# Patient Record
Sex: Female | Born: 1945 | Race: White | Hispanic: No | Marital: Married | Smoking: Former smoker
Health system: Southern US, Community
[De-identification: ages and names within clinical notes are randomized; demographics above are authoritative.]

## PROBLEM LIST (undated history)

## (undated) DIAGNOSIS — E785 Hyperlipidemia, unspecified: Secondary | ICD-10-CM

## (undated) DIAGNOSIS — M1990 Unspecified osteoarthritis, unspecified site: Secondary | ICD-10-CM

## (undated) DIAGNOSIS — C439 Malignant melanoma of skin, unspecified: Secondary | ICD-10-CM

## (undated) DIAGNOSIS — I10 Essential (primary) hypertension: Secondary | ICD-10-CM

## (undated) DIAGNOSIS — Z86010 Personal history of colonic polyps: Secondary | ICD-10-CM

## (undated) DIAGNOSIS — B79 Trichuriasis: Secondary | ICD-10-CM

## (undated) DIAGNOSIS — R42 Dizziness and giddiness: Secondary | ICD-10-CM

## (undated) DIAGNOSIS — T7840XA Allergy, unspecified, initial encounter: Secondary | ICD-10-CM

## (undated) DIAGNOSIS — F419 Anxiety disorder, unspecified: Secondary | ICD-10-CM

## (undated) DIAGNOSIS — H409 Unspecified glaucoma: Secondary | ICD-10-CM

## (undated) DIAGNOSIS — N281 Cyst of kidney, acquired: Secondary | ICD-10-CM

## (undated) HISTORY — PX: TONSILLECTOMY: SUR1361

## (undated) HISTORY — PX: ARM WOUND REPAIR / CLOSURE: SUR1141

## (undated) HISTORY — DX: Dizziness and giddiness: R42

## (undated) HISTORY — PX: WISDOM TOOTH EXTRACTION: SHX21

## (undated) HISTORY — PX: CATARACT EXTRACTION W/ INTRAOCULAR LENS  IMPLANT, BILATERAL: SHX1307

## (undated) HISTORY — PX: OTHER SURGICAL HISTORY: SHX169

## (undated) HISTORY — PX: COLONOSCOPY: SHX174

## (undated) HISTORY — PX: CHOLECYSTECTOMY: SHX55

## (undated) HISTORY — DX: Essential (primary) hypertension: I10

## (undated) HISTORY — DX: Personal history of colonic polyps: Z86.010

## (undated) HISTORY — DX: Malignant melanoma of skin, unspecified: C43.9

## (undated) HISTORY — DX: Hyperlipidemia, unspecified: E78.5

## (undated) HISTORY — DX: Trichuriasis: B79

## (undated) HISTORY — DX: Unspecified osteoarthritis, unspecified site: M19.90

## (undated) HISTORY — PX: NM RENAL LASIX (ARMC HX): HXRAD1213

## (undated) HISTORY — DX: Allergy, unspecified, initial encounter: T78.40XA

## (undated) HISTORY — DX: Unspecified glaucoma: H40.9

## (undated) HISTORY — PX: MELANOMA EXCISION: SHX5266

---

## 1999-12-24 ENCOUNTER — Encounter: Payer: Self-pay | Admitting: *Deleted

## 1999-12-24 ENCOUNTER — Ambulatory Visit (HOSPITAL_COMMUNITY): Admission: RE | Admit: 1999-12-24 | Discharge: 1999-12-24 | Payer: Self-pay | Admitting: *Deleted

## 2000-01-01 ENCOUNTER — Other Ambulatory Visit: Admission: RE | Admit: 2000-01-01 | Discharge: 2000-01-01 | Payer: Self-pay | Admitting: Obstetrics and Gynecology

## 2001-04-14 ENCOUNTER — Other Ambulatory Visit: Admission: RE | Admit: 2001-04-14 | Discharge: 2001-04-14 | Payer: Self-pay | Admitting: Obstetrics and Gynecology

## 2006-12-12 ENCOUNTER — Ambulatory Visit: Payer: Self-pay | Admitting: Gastroenterology

## 2007-01-10 ENCOUNTER — Encounter (INDEPENDENT_AMBULATORY_CARE_PROVIDER_SITE_OTHER): Payer: Self-pay | Admitting: Specialist

## 2007-01-10 ENCOUNTER — Ambulatory Visit: Payer: Self-pay | Admitting: Gastroenterology

## 2007-01-10 DIAGNOSIS — K5730 Diverticulosis of large intestine without perforation or abscess without bleeding: Secondary | ICD-10-CM | POA: Insufficient documentation

## 2007-12-07 DIAGNOSIS — B79 Trichuriasis: Secondary | ICD-10-CM

## 2007-12-07 HISTORY — DX: Trichuriasis: B79

## 2008-01-07 HISTORY — PX: COLON SURGERY: SHX602

## 2008-01-22 ENCOUNTER — Ambulatory Visit: Payer: Self-pay | Admitting: Internal Medicine

## 2008-02-01 ENCOUNTER — Ambulatory Visit: Payer: Self-pay | Admitting: Internal Medicine

## 2008-02-01 ENCOUNTER — Encounter: Payer: Self-pay | Admitting: Internal Medicine

## 2008-02-01 DIAGNOSIS — K644 Residual hemorrhoidal skin tags: Secondary | ICD-10-CM | POA: Insufficient documentation

## 2008-02-01 DIAGNOSIS — Z860100 Personal history of colon polyps, unspecified: Secondary | ICD-10-CM

## 2008-02-01 DIAGNOSIS — K648 Other hemorrhoids: Secondary | ICD-10-CM | POA: Insufficient documentation

## 2008-02-01 HISTORY — DX: Personal history of colon polyps, unspecified: Z86.0100

## 2008-02-01 HISTORY — DX: Personal history of colonic polyps: Z86.010

## 2008-03-01 ENCOUNTER — Ambulatory Visit: Payer: Self-pay | Admitting: Internal Medicine

## 2008-03-01 DIAGNOSIS — E785 Hyperlipidemia, unspecified: Secondary | ICD-10-CM | POA: Insufficient documentation

## 2008-04-22 ENCOUNTER — Encounter: Payer: Self-pay | Admitting: Internal Medicine

## 2008-06-26 ENCOUNTER — Encounter (INDEPENDENT_AMBULATORY_CARE_PROVIDER_SITE_OTHER): Payer: Self-pay | Admitting: *Deleted

## 2008-06-26 ENCOUNTER — Inpatient Hospital Stay (HOSPITAL_COMMUNITY): Admission: RE | Admit: 2008-06-26 | Discharge: 2008-06-29 | Payer: Self-pay | Admitting: *Deleted

## 2008-07-04 ENCOUNTER — Encounter: Payer: Self-pay | Admitting: Internal Medicine

## 2008-07-05 ENCOUNTER — Encounter: Payer: Self-pay | Admitting: Internal Medicine

## 2010-12-26 ENCOUNTER — Encounter: Payer: Self-pay | Admitting: Emergency Medicine

## 2011-04-20 NOTE — Op Note (Signed)
NAME:  Meredith Cole, Meredith Cole             ACCOUNT NO.:  [id number]   MEDICAL RECORD NO.:  [id number]          PATIENT TYPE:  INP   LOCATION:  0006                         FACILITY:  [facility]   PHYSICIAN:  Alfonse Ras, MD   DATE OF BIRTH:  [birth year]   DATE OF PROCEDURE:  DATE OF DISCHARGE:                               OPERATIVE REPORT   PREOPERATIVE DIAGNOSIS:  Villous adenoma of the cecum.   POSTOPERATIVE DIAGNOSIS:  Villous adenoma of the cecum.   PROCEDURES:  Laparoscopic assisted ileocecostomy   SURGEON:  Baruch Merl   ASSISTANT:  Leonie Man   ANESTHESIA:  General.   DESCRIPTION:  The patient was taken to the operating placed in supine  position, after adequate general anesthesia was induced.  Using  endotracheal tube, the abdomen was prepped and draped in normal sterile  fashion.  Using a 11 mm trocar in the left upper quadrant under direct  vision with the Optiview pneumoperitoneal access was obtained.  Pneumoperitoneum was obtained.  A 5 mm trocar was placed in the right  midabdomen.  An additional, 10 mm trocar was placed in the left lower  quadrant.  The cecum and its area of tattooing were easily identified.  The appendix and ileocecal region were mobilized and the entire right  colon was mobilized using a harmonic scalpel along the line of Toldt.  There were a number of adhesions to the liver and these were taken down  with a harmonic scalpel.  We had very adequate mobilization and I was  satisfied that we were ready to convert to open for resection.  A  transverse incision was made in the right abdomen extending from the 5  mm trocar laterally dividing the oblique muscles and peritoneum was  entered.  The ileocecum was easily delivered up and through the wound.  The terminal ileum was transected approximately 5-7 cm proximal to the  ileocecal valve and the right colon was transected using a GI stapling  device just distal to the tattooing.  The mesentery was  taken down with  a LigaSure and the specimen was removed.  Opening it on the back table  verified the presence of sessile villous adenoma at least 5 cm from the  distal margin.  The side-to-side ileocolostomy was performed in standard  fashion using a GI 75 and a 60 mm TA.  The mesentery was closed with  interrupted 2-0 silks.  The anastomosis was widely patent.  This was  accomplished  over a wound protector.  There was no spillage the abdomen was copiously  irrigated.  All trocars had been removed.  The fascia was closed in two  layers using running #1 Novofil.  Skin was closed with staples.  Sterile  dressings were applied.  The patient tolerated the procedure, well went  to PACU in good condition.      Alfonse Ras, MD  Electronically Signed     KRE/MEDQ  D:  06/26/2008  T:  06/27/2008  Job:  [id number]   cc:   Iva Boop, MD,FACG  [facility]  [address] [place], [place]  [address]   Duncan Dull, M.D.  Fax: [phone]

## 2011-04-20 NOTE — Assessment & Plan Note (Signed)
[facility]                         GASTROENTEROLOGY OFFICE NOTE   NAME:Meredith Cole, Meredith Cole                    MRN:          [id number]  DATE:03/01/2008                            DOB:          [birth year]    CHIEF COMPLAINT:  Follow up of colon polyp.   [name] had a very large tubulovillous adenoma, partially removed  from the colon on February 01, 2008.  I tattooed and clipped the area to  mark the site.  X-ray demonstrated the clip in the right colon, probably  near the ileocecal valve.  I believe this polyp corresponded to one  originally diagnosed at colonoscopy on 01/10/07 by Dr. Corinda Gubler.  She  also incidentally had some whipworm present, and we treated that with  Albendazole.  It turns out, she was in [place] on a mission trip  within the past year, so perhaps that is where she contracted that.   Today I explained to her that I thought this polyp was too large in the  way it interdigitated in a flat fashion into the mucosa, that it would  be extremely unlikely, if not impossible, to get a complete resection  with the colonoscope.  She has seen Dr. Colin Benton in the past for her  gallbladder and would like to see her with respect to surgery, and I  think that is just fine.   PAST MEDICAL HISTORY:  Also notable for hypertension, dyslipidemia, and  cholecystectomy.   FAMILY HISTORY:  Positive for mother with colon cancer.   PHYSICAL EXAMINATION:  Height 5 feet 8.  Weight 207 pounds.  Blood  pressure 130/82, pulse 88.   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  In my  judgment, this is not resectable with colonoscopic methods.   PLAN:  Surgical consultation with Dr. Baruch Merl.  Note that the  patient has an upcoming trip planned for July.  This is a benign lesion.  I do not think there is any urgency to resect it, but it ought to be  done sometime this year, I think.   NOTE:  Would plan for a recall colonoscopy in  three years for polyp  surveillance, February, 2012.     Iva Boop, MD,FACG  Electronically Signed    CEG/MedQ  DD: 03/01/2008  DT: 03/01/2008  Job #: [id number]   cc:   Alfonse Ras, MD  Duncan Dull, M.D.

## 2011-05-11 ENCOUNTER — Telehealth: Payer: Self-pay | Admitting: Internal Medicine

## 2011-05-11 NOTE — Telephone Encounter (Signed)
I have placed the colon, surgical report and path on your desk for review.  Please advise when she needs colon.

## 2011-05-12 ENCOUNTER — Encounter: Payer: Self-pay | Admitting: Internal Medicine

## 2011-05-12 NOTE — Telephone Encounter (Signed)
I have left a message for the patient to call back to schedule a colon at anytime she is due in July

## 2011-05-12 NOTE — Telephone Encounter (Signed)
July 2012 will be time for next routine repeat colonoscopy She can schedule it in June if she wants Reason is v76.51 and v12.72

## 2011-06-14 ENCOUNTER — Ambulatory Visit (AMBULATORY_SURGERY_CENTER): Payer: Medicare Other | Admitting: *Deleted

## 2011-06-14 DIAGNOSIS — Z86010 Personal history of colonic polyps: Secondary | ICD-10-CM

## 2011-06-14 DIAGNOSIS — Z800 Family history of malignant neoplasm of digestive organs: Secondary | ICD-10-CM

## 2011-06-14 MED ORDER — PEG-KCL-NACL-NASULF-NA ASC-C 100 G PO SOLR: ORAL | Status: DC

## 2011-06-28 ENCOUNTER — Encounter: Payer: Self-pay | Admitting: Internal Medicine

## 2011-06-28 ENCOUNTER — Ambulatory Visit (AMBULATORY_SURGERY_CENTER): Payer: Medicare Other | Admitting: Internal Medicine

## 2011-06-28 VITALS — BP 116/67 | HR 61 | Temp 98.5°F | Resp 16 | Ht 67.0 in | Wt 183.0 lb

## 2011-06-28 DIAGNOSIS — Z800 Family history of malignant neoplasm of digestive organs: Secondary | ICD-10-CM

## 2011-06-28 DIAGNOSIS — K5730 Diverticulosis of large intestine without perforation or abscess without bleeding: Secondary | ICD-10-CM

## 2011-06-28 DIAGNOSIS — Z860100 Personal history of colon polyps, unspecified: Secondary | ICD-10-CM

## 2011-06-28 DIAGNOSIS — Z1211 Encounter for screening for malignant neoplasm of colon: Secondary | ICD-10-CM

## 2011-06-28 DIAGNOSIS — K644 Residual hemorrhoidal skin tags: Secondary | ICD-10-CM

## 2011-06-28 MED ORDER — SODIUM CHLORIDE 0.9 % IV SOLN: 500.0000 mL | INTRAVENOUS | Status: DC

## 2011-06-28 NOTE — Patient Instructions (Addendum)
Your exam today showed no polyps or cancer. You do have some diverticulosis and small hemorrhoids which are usually not a problem. Please read the information below as well as the other discharge instructions provided. You need a routine repeat colonoscopy in 5 years. We will try to do a better job of tracking that.  Iva Boop, MD, [facility]  Diverticulosis Diverticulosis is a common condition that develops when small pouches (diverticula) form in the wall of the colon. The risk of diverticulosis increases with age. It happens more often in people who eat a low-fiber diet. Most individuals with diverticulosis have no symptoms. Those individuals with symptoms usually experience belly (abdominal) pain, constipation, or loose stools (diarrhea). HOME CARE INSTRUCTIONS  Increase the amount of fiber in your diet as directed by your caregiver or dietician. This may reduce symptoms of diverticulosis.   Your caregiver may recommend taking a dietary fiber supplement.   Drink at least 6 to 8 glasses of water each day to prevent constipation.   Try not to strain when you have a bowel movement.   Your caregiver may recommend avoiding nuts and seeds to prevent complications, although this is still an uncertain benefit.   Only take over-the-counter or prescription medicines for pain, discomfort, or fever as directed by your caregiver.  FOODS HAVING HIGH FIBER CONTENT INCLUDE:  Fruits. Apple, peach, pear, tangerine, raisins, prunes.   Vegetables. Brussels sprouts, asparagus, broccoli, cabbage, carrot, cauliflower, romaine lettuce, spinach, summer squash, tomato, winter squash, zucchini.   Starchy Vegetables. Baked beans, kidney beans, lima beans, split peas, lentils, potatoes (with skin).   Grains. Whole wheat bread, brown rice, bran flake cereal, plain oatmeal, white rice, shredded wheat, bran muffins.  SEEK IMMEDIATE MEDICAL CARE IF:  You develop increasing pain or severe bloating.   You have an  oral temperature above 100.57F, not controlled by medicine.   You develop vomiting or bowel movements that are bloody or black.  Document Released: 08/19/2004 Document Re-Released: 05/12/2010 [facility] Patient Information 2011 [place], [place].  Hemorrhoids Hemorrhoids are dilated (enlarged) veins around the rectum. Sometimes clots will form in the veins. This makes them swollen and painful. These are called thrombosed hemorrhoids. Causes of hemorrhoids include:  Pregnancy: this increases the pressure in the hemorrhoidal veins.   Constipation.   Straining to have a bowel movement.  HOME CARE INSTRUCTIONS  Eat a well balanced diet and drink 6 to 8 glasses of water every day to avoid constipation. You may also use a bulk laxative.   Avoid straining to have bowel movements.   Keep anal area dry and clean.   Only take over-the-counter or prescription medicines for pain, discomfort, or fever as directed by your caregiver.  If thrombosed:  Take hot sitz baths for 20 to 30 minutes, 3 to 4 times per day.   If the hemorrhoids are very tender and swollen, place ice packs on area as tolerated. Using ice packs between sitz baths may be helpful. Fill a plastic bag with ice and use a towel between the bag of ice and your skin.   Special creams and suppositories (Anusol, Nupercainal, Wyanoids) may be used or applied as directed.   Do not use a donut shaped pillow or sit on the toilet for long periods. This increases blood pooling and pain.   Move your bowels when your body has the urge; this will require less straining and will decrease pain and pressure.   Only take over-the-counter or prescription medicines for pain, discomfort, or fever as  directed by your caregiver.  SEEK MEDICAL CARE IF:  You have increasing pain and swelling that is not controlled with your prescription.   You have uncontrolled bleeding.   You have an inability or difficulty having a bowel movement.   You have pain  or inflammation outside the area of the hemorrhoids.   You have chills and/or an oral temperature above 100.5F that lasts for 2 days or longer, or as your caregiver suggests.  MAKE SURE YOU:   Understand these instructions.   Will watch your condition.   Will get help right away if you are not doing well or get worse.  Document Released: 11/19/2000 Document Re-Released: 11/04/2008 [facility] Patient Information 2011 [place], [place].   Please review all discharge papers given to you by the recovery room nurse.  If you experience problems after discharge you may call [phone]. You will receive a call in the am from one of our nurses to see how you are doing and answer any questions you may have. Thank You.

## 2011-06-28 NOTE — Progress Notes (Signed)
ANASTOMOSIS VISUALIZED BY MD WITH PROCEDURE. [facility] RN

## 2011-06-29 ENCOUNTER — Telehealth: Payer: Self-pay | Admitting: *Deleted

## 2011-06-29 NOTE — Telephone Encounter (Signed)

## 2011-09-03 LAB — CBC
HCT: 32 — ABNORMAL LOW
HCT: 33.5 — ABNORMAL LOW
HCT: 38.8
Hemoglobin: 13.4
MCV: 94.6
MCV: 95.6
Platelets: 159
Platelets: 186
Platelets: 201
RBC: 4.1
RDW: 12.9
RDW: 13.3
WBC: 16 — ABNORMAL HIGH
WBC: 7.2

## 2011-09-03 LAB — BASIC METABOLIC PANEL
BUN: 8
Chloride: 103
Chloride: 104
GFR calc Af Amer: >60
GFR calc non Af Amer: >60
Glucose, Bld: 158 — ABNORMAL HIGH
Potassium: 4
Potassium: 4.5
Sodium: 141

## 2011-09-03 LAB — DIFFERENTIAL
Eosinophils Relative: 3
Lymphocytes Relative: 38
Lymphs Abs: 2.7
Monocytes Relative: 6

## 2012-01-10 ENCOUNTER — Ambulatory Visit: Payer: Medicare Other

## 2012-01-13 ENCOUNTER — Ambulatory Visit: Payer: Medicare Other | Attending: Family Medicine | Admitting: Physical Therapy

## 2012-01-13 DIAGNOSIS — M545 Low back pain, unspecified: Secondary | ICD-10-CM | POA: Insufficient documentation

## 2012-01-13 DIAGNOSIS — IMO0001 Reserved for inherently not codable concepts without codable children: Secondary | ICD-10-CM | POA: Insufficient documentation

## 2012-01-21 ENCOUNTER — Ambulatory Visit: Payer: Medicare Other

## 2012-01-28 ENCOUNTER — Ambulatory Visit: Payer: Medicare Other

## 2012-02-04 ENCOUNTER — Ambulatory Visit: Payer: Medicare Other | Attending: Family Medicine

## 2012-02-04 DIAGNOSIS — IMO0001 Reserved for inherently not codable concepts without codable children: Secondary | ICD-10-CM | POA: Insufficient documentation

## 2012-02-04 DIAGNOSIS — M545 Low back pain, unspecified: Secondary | ICD-10-CM | POA: Insufficient documentation

## 2012-06-29 ENCOUNTER — Other Ambulatory Visit: Payer: Self-pay | Admitting: Family Medicine

## 2012-06-29 ENCOUNTER — Ambulatory Visit
Admission: RE | Admit: 2012-06-29 | Discharge: 2012-06-29 | Disposition: A | Discharge status: Home / Self Care | Payer: Medicare Other | Source: Ambulatory Visit | Attending: Family Medicine | Admitting: Family Medicine

## 2012-06-29 DIAGNOSIS — M5410 Radiculopathy, site unspecified: Secondary | ICD-10-CM

## 2012-09-29 ENCOUNTER — Other Ambulatory Visit: Payer: Self-pay | Admitting: Dermatology

## 2012-11-15 ENCOUNTER — Other Ambulatory Visit: Payer: Self-pay | Admitting: Dermatology

## 2013-04-10 ENCOUNTER — Other Ambulatory Visit: Payer: Self-pay | Admitting: Dermatology

## 2013-12-12 ENCOUNTER — Other Ambulatory Visit: Payer: Self-pay | Admitting: Dermatology

## 2013-12-25 ENCOUNTER — Other Ambulatory Visit: Payer: Self-pay | Admitting: Dermatology

## 2014-04-25 ENCOUNTER — Other Ambulatory Visit: Payer: Self-pay | Admitting: Dermatology

## 2014-07-05 ENCOUNTER — Other Ambulatory Visit: Payer: Self-pay | Admitting: Family Medicine

## 2014-07-05 DIAGNOSIS — R1310 Dysphagia, unspecified: Secondary | ICD-10-CM

## 2014-07-15 ENCOUNTER — Ambulatory Visit
Admission: RE | Admit: 2014-07-15 | Discharge: 2014-07-15 | Disposition: A | Discharge status: Home / Self Care | Payer: PRIVATE HEALTH INSURANCE | Source: Ambulatory Visit | Attending: Family Medicine | Admitting: Family Medicine

## 2014-07-15 DIAGNOSIS — R1310 Dysphagia, unspecified: Secondary | ICD-10-CM

## 2014-12-06 DIAGNOSIS — C439 Malignant melanoma of skin, unspecified: Secondary | ICD-10-CM

## 2014-12-06 HISTORY — DX: Malignant melanoma of skin, unspecified: C43.9

## 2015-05-09 DIAGNOSIS — Z85828 Personal history of other malignant neoplasm of skin: Secondary | ICD-10-CM | POA: Diagnosis not present

## 2015-05-09 DIAGNOSIS — D2261 Melanocytic nevi of right upper limb, including shoulder: Secondary | ICD-10-CM | POA: Diagnosis not present

## 2015-05-09 DIAGNOSIS — D2272 Melanocytic nevi of left lower limb, including hip: Secondary | ICD-10-CM | POA: Diagnosis not present

## 2015-05-09 DIAGNOSIS — D2262 Melanocytic nevi of left upper limb, including shoulder: Secondary | ICD-10-CM | POA: Diagnosis not present

## 2015-05-09 DIAGNOSIS — L821 Other seborrheic keratosis: Secondary | ICD-10-CM | POA: Diagnosis not present

## 2015-05-09 DIAGNOSIS — D2271 Melanocytic nevi of right lower limb, including hip: Secondary | ICD-10-CM | POA: Diagnosis not present

## 2015-05-09 DIAGNOSIS — Z85820 Personal history of malignant melanoma of skin: Secondary | ICD-10-CM | POA: Diagnosis not present

## 2015-05-09 DIAGNOSIS — D225 Melanocytic nevi of trunk: Secondary | ICD-10-CM | POA: Diagnosis not present

## 2015-05-09 DIAGNOSIS — D1801 Hemangioma of skin and subcutaneous tissue: Secondary | ICD-10-CM | POA: Diagnosis not present

## 2015-06-06 ENCOUNTER — Encounter: Payer: Self-pay | Admitting: Internal Medicine

## 2015-06-06 ENCOUNTER — Encounter: Payer: Self-pay | Admitting: Gastroenterology

## 2015-07-07 DIAGNOSIS — R5383 Other fatigue: Secondary | ICD-10-CM | POA: Diagnosis not present

## 2015-07-07 DIAGNOSIS — J301 Allergic rhinitis due to pollen: Secondary | ICD-10-CM | POA: Diagnosis not present

## 2015-07-07 DIAGNOSIS — F419 Anxiety disorder, unspecified: Secondary | ICD-10-CM | POA: Diagnosis not present

## 2015-07-07 DIAGNOSIS — N952 Postmenopausal atrophic vaginitis: Secondary | ICD-10-CM | POA: Diagnosis not present

## 2015-07-07 DIAGNOSIS — Z0000 Encounter for general adult medical examination without abnormal findings: Secondary | ICD-10-CM | POA: Diagnosis not present

## 2015-07-07 DIAGNOSIS — F322 Major depressive disorder, single episode, severe without psychotic features: Secondary | ICD-10-CM | POA: Diagnosis not present

## 2015-07-07 DIAGNOSIS — E780 Pure hypercholesterolemia: Secondary | ICD-10-CM | POA: Diagnosis not present

## 2015-08-18 DIAGNOSIS — M810 Age-related osteoporosis without current pathological fracture: Secondary | ICD-10-CM | POA: Diagnosis not present

## 2015-08-18 DIAGNOSIS — M8580 Other specified disorders of bone density and structure, unspecified site: Secondary | ICD-10-CM | POA: Diagnosis not present

## 2015-08-18 DIAGNOSIS — Z1231 Encounter for screening mammogram for malignant neoplasm of breast: Secondary | ICD-10-CM | POA: Diagnosis not present

## 2015-11-24 DIAGNOSIS — Z85820 Personal history of malignant melanoma of skin: Secondary | ICD-10-CM | POA: Diagnosis not present

## 2015-11-24 DIAGNOSIS — Z85828 Personal history of other malignant neoplasm of skin: Secondary | ICD-10-CM | POA: Diagnosis not present

## 2015-11-24 DIAGNOSIS — D2271 Melanocytic nevi of right lower limb, including hip: Secondary | ICD-10-CM | POA: Diagnosis not present

## 2015-11-24 DIAGNOSIS — D171 Benign lipomatous neoplasm of skin and subcutaneous tissue of trunk: Secondary | ICD-10-CM | POA: Diagnosis not present

## 2015-11-24 DIAGNOSIS — D2262 Melanocytic nevi of left upper limb, including shoulder: Secondary | ICD-10-CM | POA: Diagnosis not present

## 2015-11-24 DIAGNOSIS — D225 Melanocytic nevi of trunk: Secondary | ICD-10-CM | POA: Diagnosis not present

## 2015-11-24 DIAGNOSIS — D2261 Melanocytic nevi of right upper limb, including shoulder: Secondary | ICD-10-CM | POA: Diagnosis not present

## 2015-11-24 DIAGNOSIS — D2272 Melanocytic nevi of left lower limb, including hip: Secondary | ICD-10-CM | POA: Diagnosis not present

## 2015-11-24 DIAGNOSIS — J029 Acute pharyngitis, unspecified: Secondary | ICD-10-CM | POA: Diagnosis not present

## 2015-11-24 DIAGNOSIS — L814 Other melanin hyperpigmentation: Secondary | ICD-10-CM | POA: Diagnosis not present

## 2015-12-07 HISTORY — PX: COLONOSCOPY: SHX174

## 2015-12-07 HISTORY — PX: POLYPECTOMY: SHX149

## 2016-01-14 DIAGNOSIS — R002 Palpitations: Secondary | ICD-10-CM | POA: Insufficient documentation

## 2016-01-19 ENCOUNTER — Ambulatory Visit (INDEPENDENT_AMBULATORY_CARE_PROVIDER_SITE_OTHER): Payer: PRIVATE HEALTH INSURANCE

## 2016-01-19 DIAGNOSIS — R002 Palpitations: Secondary | ICD-10-CM

## 2016-07-06 ENCOUNTER — Encounter: Payer: Self-pay | Admitting: Internal Medicine

## 2016-07-09 ENCOUNTER — Encounter: Payer: Self-pay | Admitting: Internal Medicine

## 2016-09-10 ENCOUNTER — Other Ambulatory Visit: Payer: Self-pay | Admitting: Family Medicine

## 2016-09-10 DIAGNOSIS — G4459 Other complicated headache syndrome: Secondary | ICD-10-CM

## 2016-09-11 ENCOUNTER — Ambulatory Visit
Admission: RE | Admit: 2016-09-11 | Discharge: 2016-09-11 | Disposition: A | Discharge status: Home / Self Care | Payer: PRIVATE HEALTH INSURANCE | Source: Ambulatory Visit | Attending: Family Medicine | Admitting: Family Medicine

## 2016-09-11 DIAGNOSIS — G4459 Other complicated headache syndrome: Secondary | ICD-10-CM

## 2016-09-11 MED ORDER — GADOBENATE DIMEGLUMINE 529 MG/ML IV SOLN
17.0000 mL | Freq: Once | INTRAVENOUS | Status: AC | PRN
  Administered 2016-09-11: 17 mL via INTRAVENOUS

## 2016-09-20 ENCOUNTER — Encounter: Payer: Self-pay | Admitting: Internal Medicine

## 2016-09-27 ENCOUNTER — Encounter: Payer: PRIVATE HEALTH INSURANCE | Admitting: Internal Medicine

## 2016-10-04 ENCOUNTER — Encounter: Payer: Self-pay | Admitting: Internal Medicine

## 2016-10-04 ENCOUNTER — Ambulatory Visit (AMBULATORY_SURGERY_CENTER): Payer: Self-pay | Admitting: *Deleted

## 2016-10-04 VITALS — Ht 66.5 in | Wt 197.0 lb

## 2016-10-04 DIAGNOSIS — Z86010 Personal history of colonic polyps: Secondary | ICD-10-CM

## 2016-10-04 DIAGNOSIS — Z800 Family history of malignant neoplasm of digestive organs: Secondary | ICD-10-CM

## 2016-10-04 NOTE — Progress Notes (Signed)
Patient denies any allergies to eggs or soy. Patient denies any problems with anesthesia/sedation. Patient denies any oxygen use at home and does not take any diet/weight loss medications. Pt declined EMMI education video.

## 2016-10-11 ENCOUNTER — Ambulatory Visit (AMBULATORY_SURGERY_CENTER): Payer: PRIVATE HEALTH INSURANCE | Admitting: Internal Medicine

## 2016-10-11 ENCOUNTER — Encounter: Payer: Self-pay | Admitting: Internal Medicine

## 2016-10-11 VITALS — BP 138/75 | HR 74 | Temp 98.0°F | Resp 13 | Ht 66.5 in | Wt 197.0 lb

## 2016-10-11 DIAGNOSIS — Z86010 Personal history of colonic polyps: Secondary | ICD-10-CM

## 2016-10-11 DIAGNOSIS — Z800 Family history of malignant neoplasm of digestive organs: Secondary | ICD-10-CM | POA: Diagnosis not present

## 2016-10-11 MED ORDER — SODIUM CHLORIDE 0.9 % IV SOLN
500.0000 mL | INTRAVENOUS | Status: DC
Start: 2016-10-11 — End: 2018-01-06

## 2016-10-11 NOTE — Progress Notes (Signed)
A/ox3, pleased with MAC, report to RN 

## 2016-10-11 NOTE — Op Note (Signed)
[facility] Patient Name: Tanajia Bramlet Procedure Date: 10/11/2016 3:40 PM MRN: [id number] Endoscopist: Gatha Mayer , MD Age: 70 Referring MD:  Date of Birth: 05/17/1946 Gender: Female Account #: [id number] Procedure:                Colonoscopy Indications:              Family history of colon cancer in a first-degree                            relative, Personal history of colonic polyps Medicines:                Propofol per Anesthesia, Monitored Anesthesia Care Procedure:                Pre-Anesthesia Assessment:                           - Prior to the procedure, a History and Physical                            was performed, and patient medications and                            allergies were reviewed. The patient's tolerance of                            previous anesthesia was also reviewed. The risks                            and benefits of the procedure and the sedation                            options and risks were discussed with the patient.                            All questions were answered, and informed consent                            was obtained. Prior Anticoagulants: The patient has                            taken no previous anticoagulant or antiplatelet                            agents. ASA Grade Assessment: II - A patient with                            mild systemic disease. After reviewing the risks                            and benefits, the patient was deemed in                            satisfactory condition to undergo the procedure.  After obtaining informed consent, the colonoscope                            was passed under direct vision. Throughout the                            procedure, the patient's blood pressure, pulse, and                            oxygen saturations were monitored continuously. The                            [id number] was introduced through the anus               and advanced to the the ileocolonic anastomosis.                            The rectum and Ileocolonic anastomsis areas were                            photographed. The quality of the bowel preparation                            was excellent. Scope In: 3:54:39 PM Scope Out: 4:09:21 PM Scope Withdrawal Time: 0 hours 8 minutes 12 seconds  Total Procedure Duration: 0 hours 14 minutes 42 seconds  Findings:                 The perianal and digital rectal examinations were                            normal.                           There was evidence of a prior end-to-end                            ileo-colonic anastomosis in the ascending colon.                            This was patent.                           A diffuse area of mildly melanotic mucosa was found                            in the entire colon.                           The exam was otherwise without abnormality on                            direct and retroflexion views. Complications:            No immediate complications. Estimated Blood Loss:     Estimated blood loss: none. Impression:               -  Patent end-to-end ileo-colonic anastomosis.                           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                           - The examination was otherwise normal on direct                            and retroflexion views.                           - No specimens collected. Recommendation:           - Patient has a contact number available for                            emergencies. The signs and symptoms of potential                            delayed complications were discussed with the                            patient. Return to normal activities tomorrow.                            Written discharge instructions were provided to the                            patient.                           - Resume previous diet.                           - Continue present medications.                            - Repeat colonoscopy in 5 years for screening                            purposes and for surveillance.                           - Personal hx cecal TV adenoma and mother died of                            colon cancer in 40's Gatha Mayer, MD 10/11/2016 4:17:08 PM This report has been signed electronically.

## 2016-10-11 NOTE — Patient Instructions (Addendum)
   No polyps.  Your next routine colonoscopy should be in 5 years - 2022.  I appreciate the opportunity to care for you. Gatha Mayer, MD, FACG     YOU HAD AN ENDOSCOPIC PROCEDURE TODAY AT [facility]:   Refer to the procedure report that was given to you for any specific questions about what was found during the examination.  If the procedure report does not answer your questions, please call your gastroenterologist to clarify.  If you requested that your care partner not be given the details of your procedure findings, then the procedure report has been included in a sealed envelope for you to review at your convenience later.  YOU SHOULD EXPECT: Some feelings of bloating in the abdomen. Passage of more gas than usual.  Walking can help get rid of the air that was put into your GI tract during the procedure and reduce the bloating. If you had a lower endoscopy (such as a colonoscopy or flexible sigmoidoscopy) you may notice spotting of blood in your stool or on the toilet paper. If you underwent a bowel prep for your procedure, you may not have a normal bowel movement for a few days.  Please Note:  You might notice some irritation and congestion in your nose or some drainage.  This is from the oxygen used during your procedure.  There is no need for concern and it should clear up in a day or so.  SYMPTOMS TO REPORT IMMEDIATELY:   Following lower endoscopy (colonoscopy or flexible sigmoidoscopy):  Excessive amounts of blood in the stool  Significant tenderness or worsening of abdominal pains  Swelling of the abdomen that is new, acute  Fever of 100F or higher    For urgent or emergent issues, a gastroenterologist can be reached at any hour by calling [phone].   DIET:  We do recommend a small meal at first, but then you may proceed to your regular diet.  Drink plenty of fluids but you should avoid alcoholic beverages for 24 hours.  ACTIVITY:  You  should plan to take it easy for the rest of today and you should NOT DRIVE or use heavy machinery until tomorrow (because of the sedation medicines used during the test).    FOLLOW UP: Our staff will call the number listed on your records the next business day following your procedure to check on you and address any questions or concerns that you may have regarding the information given to you following your procedure. If we do not reach you, we will leave a message.  However, if you are feeling well and you are not experiencing any problems, there is no need to return our call.  We will assume that you have returned to your regular daily activities without incident.  If any biopsies were taken you will be contacted by phone or by letter within the next 1-3 weeks.  Please call us at [phone] if you have not heard about the biopsies in 3 weeks.    SIGNATURES/CONFIDENTIALITY: You and/or your care partner have signed paperwork which will be entered into your electronic medical record.  These signatures attest to the fact that that the information above on your After Visit Summary has been reviewed and is understood.  Full responsibility of the confidentiality of this discharge information lies with you and/or your care-partner.   Resume medications.

## 2016-10-12 ENCOUNTER — Telehealth: Payer: Self-pay

## 2016-10-12 NOTE — Telephone Encounter (Signed)
  Follow up Call-  Call back number 10/11/2016  Post procedure Call Back phone  # [phone] cell  Permission to leave phone message Yes  Some recent data might be hidden     Patient questions:  Do you have a fever, pain , or abdominal swelling? No. Pain Score  0 *  Have you tolerated food without any problems? Yes.    Have you been able to return to your normal activities? Yes.    Do you have any questions about your discharge instructions: Diet   No. Medications  No. Follow up visit  No.  Do you have questions or concerns about your Care? No.  Actions: * If pain score is 4 or above: No action needed, pain <4.

## 2016-10-30 ENCOUNTER — Emergency Department (HOSPITAL_COMMUNITY): Payer: PRIVATE HEALTH INSURANCE

## 2016-10-30 ENCOUNTER — Emergency Department (HOSPITAL_COMMUNITY)
Admission: EM | Admit: 2016-10-30 | Discharge: 2016-10-31 | Disposition: A | Discharge status: Home / Self Care | Payer: PRIVATE HEALTH INSURANCE | Attending: Emergency Medicine | Admitting: Emergency Medicine

## 2016-10-30 ENCOUNTER — Encounter (HOSPITAL_COMMUNITY): Payer: Self-pay | Admitting: Family Medicine

## 2016-10-30 DIAGNOSIS — R112 Nausea with vomiting, unspecified: Secondary | ICD-10-CM

## 2016-10-30 DIAGNOSIS — Z87891 Personal history of nicotine dependence: Secondary | ICD-10-CM | POA: Diagnosis not present

## 2016-10-30 DIAGNOSIS — R197 Diarrhea, unspecified: Secondary | ICD-10-CM | POA: Insufficient documentation

## 2016-10-30 DIAGNOSIS — R1084 Generalized abdominal pain: Secondary | ICD-10-CM | POA: Insufficient documentation

## 2016-10-30 DIAGNOSIS — Z7982 Long term (current) use of aspirin: Secondary | ICD-10-CM | POA: Insufficient documentation

## 2016-10-30 DIAGNOSIS — I10 Essential (primary) hypertension: Secondary | ICD-10-CM | POA: Diagnosis not present

## 2016-10-30 LAB — COMPREHENSIVE METABOLIC PANEL
ALBUMIN: 4.2 g/dL (ref 3.5–5.0)
ALK PHOS: 49 U/L (ref 38–126)
ALT: 23 U/L (ref 14–54)
ANION GAP: 8 (ref 5–15)
AST: 20 U/L (ref 15–41)
BILIRUBIN TOTAL: 1 mg/dL (ref 0.3–1.2)
BUN: 16 mg/dL (ref 6–20)
CALCIUM: 9.2 mg/dL (ref 8.9–10.3)
CO2: 27 mmol/L (ref 22–32)
Chloride: 104 mmol/L (ref 101–111)
Creatinine, Ser: 1.07 mg/dL — ABNORMAL HIGH (ref 0.44–1.00)
GFR calc non Af Amer: 51 mL/min — ABNORMAL LOW (ref >60)
GFR, EST AFRICAN AMERICAN: 59 mL/min — AB (ref >60)
GLUCOSE: 105 mg/dL — AB (ref 65–99)
POTASSIUM: 3.6 mmol/L (ref 3.5–5.1)
SODIUM: 139 mmol/L (ref 135–145)
TOTAL PROTEIN: 7.4 g/dL (ref 6.5–8.1)

## 2016-10-30 LAB — URINE MICROSCOPIC-ADD ON: RBC / HPF: NONE SEEN RBC/hpf (ref 0–5)

## 2016-10-30 LAB — URINALYSIS, ROUTINE W REFLEX MICROSCOPIC
Glucose, UA: NEGATIVE mg/dL
Ketones, ur: NEGATIVE mg/dL
NITRITE: NEGATIVE
PH: 5.5 (ref 5.0–8.0)
Protein, ur: NEGATIVE mg/dL
SPECIFIC GRAVITY, URINE: 1.028 (ref 1.005–1.030)

## 2016-10-30 LAB — LIPASE, BLOOD: Lipase: 26 U/L (ref 11–51)

## 2016-10-30 LAB — CBC
HEMATOCRIT: 42.2 % (ref 36.0–46.0)
HEMOGLOBIN: 14.2 g/dL (ref 12.0–15.0)
MCH: 31.6 pg (ref 26.0–34.0)
MCHC: 33.6 g/dL (ref 30.0–36.0)
MCV: 94 fL (ref 78.0–100.0)
Platelets: 263 10*3/uL (ref 150–400)
RBC: 4.49 MIL/uL (ref 3.87–5.11)
RDW: 13.7 % (ref 11.5–15.5)
WBC: 7.7 10*3/uL (ref 4.0–10.5)

## 2016-10-30 MED ORDER — IOPAMIDOL (ISOVUE-300) INJECTION 61%
INTRAVENOUS | Status: AC
  Filled 2016-10-30: qty 100

## 2016-10-30 MED ORDER — SODIUM CHLORIDE 0.9 % IV BOLUS (SEPSIS)
1000.0000 mL | Freq: Once | INTRAVENOUS | Status: AC
  Administered 2016-10-30: 1000 mL via INTRAVENOUS

## 2016-10-30 MED ORDER — ONDANSETRON HCL 4 MG/2ML IJ SOLN
4.0000 mg | Freq: Once | INTRAMUSCULAR | Status: AC
  Administered 2016-10-30: 4 mg via INTRAVENOUS
  Filled 2016-10-30: qty 2

## 2016-10-30 MED ORDER — MORPHINE SULFATE (PF) 4 MG/ML IV SOLN
4.0000 mg | Freq: Once | INTRAVENOUS | Status: AC
  Administered 2016-10-30: 4 mg via INTRAVENOUS
  Filled 2016-10-30: qty 1

## 2016-10-30 MED ORDER — IOPAMIDOL (ISOVUE-300) INJECTION 61%
100.0000 mL | Freq: Once | INTRAVENOUS | Status: AC | PRN
  Administered 2016-10-30: 100 mL via INTRAVENOUS

## 2016-10-30 MED ORDER — ONDANSETRON HCL 4 MG PO TABS: 4.0000 mg | ORAL_TABLET | Freq: Four times a day (QID) | ORAL | 0 refills | Status: DC

## 2016-10-30 MED ORDER — SODIUM CHLORIDE 0.9 % IJ SOLN
INTRAMUSCULAR | Status: AC
  Filled 2016-10-30: qty 50

## 2016-10-30 MED ORDER — HYDROCODONE-ACETAMINOPHEN 5-325 MG PO TABS: 1.0000 | ORAL_TABLET | Freq: Four times a day (QID) | ORAL | 0 refills | Status: DC | PRN

## 2016-10-30 NOTE — ED Notes (Signed)
Currently awaiting CT scan of abdomen at this time.

## 2016-10-30 NOTE — ED Notes (Signed)
PATIENT STATES SHE HAS BLACK TAR LIKE BOWEL MOVEMENTS

## 2016-10-30 NOTE — ED Triage Notes (Signed)
Patient is complaining of mid abd pain with nausea, vomiting, and diarrhea. Pt states when she went to the restroom at 16:00, pt describes "black, tar, gooey, diarrhea". Symptoms started on Wednesday morning.

## 2016-10-30 NOTE — ED Provider Notes (Signed)
[facility] DEPT Provider Note   CSN: [id number] Arrival date & time: 10/30/16  1747     History   Chief Complaint Chief Complaint  Patient presents with  . Abdominal Pain  . Emesis  . Diarrhea    HPI Meredith Cole is a 70 y.o. female.  Patient presents to the emergency department with chief complaint of generalized abdominal pain, nausea, vomiting, diarrhea. She states that the symptoms started on Wednesday morning, and gradually worsened, but now have been improving over the course of the day today. She denies any associated fevers chills. She states that the pain comes and intervals, but has been improving. She reports having prior cholecystectomy, colectomy years ago. She has not taken anything for her symptoms. There are no modifying factors. There are no other associated symptoms.   The history is provided by the patient. No language interpreter was used.    Past Medical History:  Diagnosis Date  . Allergy   . Arthritis    back  . Hyperlipidemia   . Hypertension   . Melanoma ([place]) 2016  . 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 Trichuriasis - whipworm 2009   Incidental at colonoscopy - treated with mebendazole  . Vertigo     Patient Active Problem List   Diagnosis Date Noted  . Palpitations 01/14/2016  . HYPERLIPIDEMIA 03/01/2008  . 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 bone plate and graft left upper arm    . CESAREAN SECTION    . CHOLECYSTECTOMY    . COLON SURGERY  01/2008   Dr. Zettie Pho; right hemi-colectomy for tubulovillous adenoma  . COLONOSCOPY  2002, 2008, 2009 and 06/28/2011   2002: 49mm polyp, 2008-2009: large ascending tubulovillous adenoma. 2012: diverticulosis, external hemorrhoids  . MELANOMA EXCISION     face    OB History    No data available       Home Medications    Prior to Admission medications   Medication Sig Start Date  End Date Taking? Authorizing Provider  ALPRAZolam Duanne Moron) 0.25 MG tablet Take 0.5 mg by mouth at bedtime.     Historical Provider, MD  aspirin 81 MG tablet Take 81 mg by mouth daily.      Historical Provider, MD  atorvastatin (LIPITOR) 20 MG tablet Take 20 mg by mouth daily.      Historical Provider, MD  fexofenadine (ALLEGRA) 180 MG tablet Take 180 mg by mouth daily.      Historical Provider, MD  meclizine (ANTIVERT) 25 MG tablet Take 1 tablet by mouth as needed. 05/10/11   Historical Provider, MD  Multiple Vitamin (MULTIVITAMIN) tablet Take 1 tablet by mouth daily.      Historical Provider, MD    Family History Family History  Problem Relation Age of Onset  . Colon cancer Mother 57    died at age 38  . Heart disease Father   . Esophageal cancer Neg Hx   . Pancreatic cancer Neg Hx   . Rectal cancer Neg Hx   . Stomach cancer Neg Hx     Social History Social History  Substance Use Topics  . Smoking status: Former Smoker    Packs/day: 1.00    Years: 3.00    Types: Cigarettes    Quit date: 09/27/1972  . Smokeless tobacco: Never Used  . Alcohol use 1.2 oz/week    2 Glasses of wine per week     Allergies  Patient has no known allergies.   Review of Systems Review of Systems  Gastrointestinal: Positive for abdominal pain, diarrhea, nausea and vomiting.  All other systems reviewed and are negative.    Physical Exam Updated Vital Signs BP 157/71 (BP Location: Left Arm)   Pulse 80   Temp 98.4 F (36.9 C) (Oral)   Resp 18   Ht 5\' 7"  (1.702 m)   Wt 87.3 kg   SpO2 100%   BMI 30.16 kg/m   Physical Exam  Constitutional: She is oriented to person, place, and time. She appears well-developed and well-nourished.  HENT:  Head: Normocephalic and atraumatic.  Eyes: Conjunctivae and EOM are normal. Pupils are equal, round, and reactive to light.  Neck: Normal range of motion. Neck supple.  Cardiovascular: Normal rate and regular rhythm.  Exam reveals no gallop and no  friction rub.   No murmur heard. Pulmonary/Chest: Effort normal and breath sounds normal. No respiratory distress. She has no wheezes. She has no rales. She exhibits no tenderness.  Abdominal: Soft. Bowel sounds are normal. She exhibits no distension and no mass. There is tenderness. There is no rebound and no guarding.  Musculoskeletal: Normal range of motion. She exhibits no edema or tenderness.  Neurological: She is alert and oriented to person, place, and time.  Skin: Skin is warm and dry.  Psychiatric: She has a normal mood and affect. Her behavior is normal. Judgment and thought content normal.  Nursing note and vitals reviewed.    ED Treatments / Results  Labs (all labs ordered are listed, but only abnormal results are displayed) Labs Reviewed  COMPREHENSIVE METABOLIC PANEL - Abnormal; Notable for the following:       Result Value   Glucose, Bld 105 (*)    Creatinine, Ser 1.07 (*)    GFR calc non Af Amer 51 (*)    GFR calc Af Amer 59 (*)    All other components within normal limits  URINALYSIS, ROUTINE W REFLEX MICROSCOPIC (NOT AT [facility]) - Abnormal; Notable for the following:    Color, Urine AMBER (*)    APPearance CLOUDY (*)    Hgb urine dipstick TRACE (*)    Bilirubin Urine SMALL (*)    Leukocytes, UA SMALL (*)    All other components within normal limits  URINE MICROSCOPIC-ADD ON - Abnormal; Notable for the following:    Squamous Epithelial / LPF 6-30 (*)    Bacteria, UA RARE (*)    Crystals CA OXALATE CRYSTALS (*)    All other components within normal limits  LIPASE, BLOOD  CBC    EKG  EKG Interpretation None       Radiology No results found.  Procedures Procedures (including critical care time)  Medications Ordered in ED Medications  morphine 4 MG/ML injection 4 mg (not administered)  ondansetron (ZOFRAN) injection 4 mg (not administered)  sodium chloride 0.9 % bolus 1,000 mL (1,000 mLs Intravenous New Bag/Given 10/30/16 2118)     Initial  Impression / Assessment and Plan / ED Course  I have reviewed the triage vital signs and the nursing notes.  Pertinent labs & imaging results that were available during my care of the patient were reviewed by me and considered in my medical decision making (see chart for details).  Clinical Course     Patient with generalized abdominal tenderness, nausea, vomiting, diarrhea. Will check labs and reassess.  Laboratory workup was fairly reassuring, however given multiple prior abdominal surgeries, feel that a CT scan is indicated  to rule out obstruction.  CT scan is negative for obstruction or acute surgical process. It does show inflammatory versus infectious ileitis. Discussed with Dr. Ralene Bathe, who agrees with plan for primary care/gastroenterology follow-up. Discharged home. Patient understands agrees the plan. She is stable and ready for discharge. She states that she has been feeling progressively better throughout the day today.  Final Clinical Impressions(s) / ED Diagnoses   Final diagnoses:  Generalized abdominal pain  Nausea vomiting and diarrhea    New Prescriptions Discharge Medication List as of 10/30/2016 11:53 PM    START taking these medications   Details  HYDROcodone-acetaminophen (NORCO/VICODIN) 5-325 MG tablet Take 1-2 tablets by mouth every 6 (six) hours as needed., Starting Sat 10/30/2016, Print    ondansetron (ZOFRAN) 4 MG tablet Take 1 tablet (4 mg total) by mouth every 6 (six) hours., Starting Sat 10/30/2016, Print         Montine Circle, PA-C 10/31/16 0030    Quintella Reichert, MD 10/31/16 1536

## 2016-11-01 ENCOUNTER — Telehealth: Payer: Self-pay | Admitting: Internal Medicine

## 2016-11-01 NOTE — Telephone Encounter (Signed)
Patient in the ED with abdominal pain over the weekend.  Still having pain.  She will come in and see Alonza Bogus, PA tomorrow at 3:00.  Offered appt for today, but she states that she can't get here in time

## 2016-11-02 ENCOUNTER — Ambulatory Visit (INDEPENDENT_AMBULATORY_CARE_PROVIDER_SITE_OTHER): Payer: PRIVATE HEALTH INSURANCE | Admitting: Gastroenterology

## 2016-11-02 ENCOUNTER — Encounter: Payer: Self-pay | Admitting: Gastroenterology

## 2016-11-02 VITALS — BP 140/84 | HR 80 | Ht 66.5 in | Wt 194.4 lb

## 2016-11-02 DIAGNOSIS — K921 Melena: Secondary | ICD-10-CM | POA: Diagnosis not present

## 2016-11-02 DIAGNOSIS — R1013 Epigastric pain: Secondary | ICD-10-CM

## 2016-11-02 DIAGNOSIS — R1110 Vomiting, unspecified: Secondary | ICD-10-CM

## 2016-11-02 MED ORDER — PANTOPRAZOLE SODIUM 40 MG PO TBEC: 40.0000 mg | DELAYED_RELEASE_TABLET | Freq: Two times a day (BID) | ORAL | 2 refills | Status: DC

## 2016-11-02 NOTE — Patient Instructions (Signed)
We have sent the following medications to your pharmacy for you to pick up at your convenience:  Pantoprazole  You have been scheduled for an endoscopy. Please follow written instructions given to you at your visit today. If you use inhalers (even only as needed), please bring them with you on the day of your procedure. Your physician has requested that you go to www.startemmi.com and enter the access code given to you at your visit today. This web site gives a general overview about your procedure. However, you should still follow specific instructions given to you by our office regarding your preparation for the procedure.   

## 2016-11-04 ENCOUNTER — Ambulatory Visit: Payer: PRIVATE HEALTH INSURANCE | Admitting: Physician Assistant

## 2016-11-08 ENCOUNTER — Encounter: Payer: Self-pay | Admitting: Gastroenterology

## 2016-11-08 DIAGNOSIS — K921 Melena: Secondary | ICD-10-CM | POA: Insufficient documentation

## 2016-11-08 DIAGNOSIS — R1013 Epigastric pain: Secondary | ICD-10-CM | POA: Insufficient documentation

## 2016-11-08 DIAGNOSIS — R1110 Vomiting, unspecified: Secondary | ICD-10-CM | POA: Insufficient documentation

## 2016-11-08 NOTE — Progress Notes (Signed)
11/02/2016 Meredith Cole [id number] 1946/02/18   HISTORY OF PRESENT ILLNESS:  This is a pleasant 70 year old female who is known to Dr. Carlean Purl. Just had a colonoscopy in November 2017 at which time she is found have a normal-appearing end-to-end anastomosis and melanotic mucosa throughout the colon due to laxative use. She follows with him regularly for her colonoscopies. She had colon resection in February 2009 the right hemicolectomy for tubulovillous adenoma that cannot be removed endoscopically.  She is here today for ER follow-up of complaints of epigastric abdominal pain with associated nausea, vomiting, and diarrhea.  She says that last Wednesday she had sudden onset of the symptoms.  They continued throughout Thanksgiving. She describes the diarrhea as being very watery and she is having crampy or colicky type epigastric abdominal pain. She also reports that throughout this she had a couple of episodes of black stools that she describes as looking like black shoe polish.  She ended up going to the emergency department on November 25 at which time she underwent a CT scan of the abdomen and pelvis with contrast. This showed wall thickening along the mid to distal ileum with mild mesenteric edema concerning for infectious or inflammatory ileitis.  She was not having any lower abdominal pain.  CBC, lipase, CMP all ok except for slight bump in Cr.  She thought that may she had an ulcer from being under a lot of stress at work.    Overall she is feeling much better as far as the vomiting and diarrhea goes, but still just has a lot of epigastric discomfort and poor appetite.  Is not on a PPI. Denies NSAID use.    Past Medical History:  Diagnosis Date  . Allergy   . Arthritis    back  . Hyperlipidemia   . Hypertension   . Melanoma ([place]) 2016  . 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 Trichuriasis - whipworm 2009   Incidental at colonoscopy - treated with  mebendazole  . Vertigo    Past Surgical History:  Procedure Laterality Date  . bone plate and graft left upper arm    . CESAREAN SECTION    . CHOLECYSTECTOMY    . COLON SURGERY  01/2008   Dr. Zettie Pho; right hemi-colectomy for tubulovillous adenoma  . COLONOSCOPY  2002, 2008, 2009 and 06/28/2011   2002: 79mm polyp, 2008-2009: large ascending tubulovillous adenoma. 2012: diverticulosis, external hemorrhoids  . MELANOMA EXCISION     face    reports that she quit smoking about 44 years ago. Her smoking use included Cigarettes. She has a 3.00 pack-year smoking history. She has never used smokeless tobacco. She reports that she drinks about 1.2 oz of alcohol per week . She reports that she does not use drugs. family history includes Colon cancer (age of onset: 62) in her mother; Heart disease in her father. No Known Allergies    Outpatient Encounter Prescriptions as of 11/02/2016  Medication Sig  . ALPRAZolam (XANAX) 0.25 MG tablet Take 0.5 mg by mouth at bedtime.   Marland Kitchen aspirin 81 MG tablet Take 81 mg by mouth daily.    Marland Kitchen atorvastatin (LIPITOR) 20 MG tablet Take 20 mg by mouth daily.    . fexofenadine (ALLEGRA) 180 MG tablet Take 180 mg by mouth daily.    Marland Kitchen HYDROcodone-acetaminophen (NORCO/VICODIN) 5-325 MG tablet Take 1-2 tablets by mouth every 6 (six) hours as needed.  . Multiple Vitamin (MULTIVITAMIN) tablet Take 1 tablet by mouth daily.    Marland Kitchen  ondansetron (ZOFRAN) 4 MG tablet Take 1 tablet (4 mg total) by mouth every 6 (six) hours.  . pantoprazole (PROTONIX) 40 MG tablet Take 1 tablet (40 mg total) by mouth 2 (two) times daily.   Facility-Administered Encounter Medications as of 11/02/2016  Medication  . 0.9 %  sodium chloride infusion  . 0.9 %  sodium chloride infusion     REVIEW OF SYSTEMS  : All other systems reviewed and negative except where noted in the History of Present Illness.   PHYSICAL EXAM: BP 140/84   Pulse 80   Ht 5' 6.5" (1.689 m)   Wt 194 lb 6.4 oz (88.2 kg)   BMI  30.91 kg/m  General: Well developed white female in no acute distress Head: Normocephalic and atraumatic Eyes:  Sclerae anicteric, conjunctiva pink. Ears: Normal auditory acuity Lungs: Clear throughout to auscultation Heart: Regular rate and rhythm Abdomen: Soft, non-distended.  Normal bowel sounds.  Epigastric TTP. Musculoskeletal: Symmetrical with no gross deformities  Skin: No lesions on visible extremities Extremities: No edema  Neurological: Alert oriented x 4, grossly non-focal Psychological:  Alert and cooperative. Normal mood and affect  ASSESSMENT AND PLAN: -70 year-old female with sudden onset nausea, vomiting, diarrhea, and epigastric abdominal pain. CT scan shows wall thickening along the mid to distal ileum with mild mesenteric edema concerning for infectious or inflammatory ileitis. Certainly would fit with an infectious enteritis. Continues to have significant epigastric pain and poor appetite. Also reported black stools.  Will schedule for EGD with Dr. Carlean Purl to evaluate/rule out ulcer, etc.  Will place her on pantoprazole 40 mg BID for now as well.   CC:  Darcus Austin, MD

## 2016-11-12 ENCOUNTER — Ambulatory Visit (AMBULATORY_SURGERY_CENTER): Payer: PRIVATE HEALTH INSURANCE | Admitting: Internal Medicine

## 2016-11-12 ENCOUNTER — Encounter: Payer: Self-pay | Admitting: Internal Medicine

## 2016-11-12 VITALS — BP 165/96 | HR 81 | Temp 97.8°F | Resp 16 | Ht 66.5 in | Wt 194.0 lb

## 2016-11-12 DIAGNOSIS — R1013 Epigastric pain: Secondary | ICD-10-CM

## 2016-11-12 MED ORDER — SODIUM CHLORIDE 0.9 % IV SOLN: 500.0000 mL | INTRAVENOUS | Status: DC

## 2016-11-12 NOTE — Op Note (Signed)
[facility] Patient Name: Meredith Cole Procedure Date: 11/12/2016 1:30 PM MRN: [id number] Endoscopist: Gatha Mayer , MD Age: 70 Referring MD:  Date of Birth: June 27, 1946 Gender: Female Account #: [id number] Procedure:                Upper GI endoscopy Indications:              Epigastric abdominal pain, Black stools Medicines:                Propofol per Anesthesia, Monitored Anesthesia Care Procedure:                Pre-Anesthesia Assessment:                           - Prior to the procedure, a History and Physical                            was performed, and patient medications and                            allergies were reviewed. The patient's tolerance of                            previous anesthesia was also reviewed. The risks                            and benefits of the procedure and the sedation                            options and risks were discussed with the patient.                            All questions were answered, and informed consent                            was obtained. Prior Anticoagulants: The patient                            last took aspirin 1 day prior to the procedure. ASA                            Grade Assessment: II - A patient with mild systemic                            disease. After reviewing the risks and benefits,                            the patient was deemed in satisfactory condition to                            undergo the procedure.                           After obtaining informed consent, the endoscope was  passed under direct vision. Throughout the                            procedure, the patient's blood pressure, pulse, and                            oxygen saturations were monitored continuously. The                            Model GIF-HQ190 [id number]) scope was introduced                            through the mouth, and advanced to the second part   of duodenum. The upper GI endoscopy was                            accomplished without difficulty. The patient                            tolerated the procedure well. Scope In: Scope Out: Findings:                 The esophagus was normal.                           The stomach was normal.                           The examined duodenum was normal.                           The cardia and gastric fundus were normal on                            retroflexion. Complications:            No immediate complications. Estimated Blood Loss:     Estimated blood loss: none. Impression:               - Normal esophagus.                           - Normal stomach.                           - Normal examined duodenum.                           - No specimens collected. Recommendation:           - Patient has a contact number available for                            emergencies. The signs and symptoms of potential                            delayed complications were discussed with the  patient. Return to normal activities tomorrow.                            Written discharge instructions were provided to the                            patient.                           - Resume previous diet.                           - Continue present medications.                           - Reduce pantoprazole to 40 mg/day and when                            finished stop it Gatha Mayer, MD 11/12/2016 1:50:38 PM This report has been signed electronically.

## 2016-11-12 NOTE — Patient Instructions (Addendum)
   Things look normal - I think you are right that you had an infectious process. I hope it doesn't come back. Reduce the pantoprazole to 1/day until finished and stop  I appreciate the opportunity to care for you. Gatha Mayer, MD, FACG  YOU HAD AN ENDOSCOPIC PROCEDURE TODAY AT [facility]:   Refer to the procedure report that was given to you for any specific questions about what was found during the examination.  If the procedure report does not answer your questions, please call your gastroenterologist to clarify.  If you requested that your care partner not be given the details of your procedure findings, then the procedure report has been included in a sealed envelope for you to review at your convenience later.  YOU SHOULD EXPECT: Some feelings of bloating in the abdomen. Passage of more gas than usual.  Walking can help get rid of the air that was put into your GI tract during the procedure and reduce the bloating. If you had a lower endoscopy (such as a colonoscopy or flexible sigmoidoscopy) you may notice spotting of blood in your stool or on the toilet paper. If you underwent a bowel prep for your procedure, you may not have a normal bowel movement for a few days.  Please Note:  You might notice some irritation and congestion in your nose or some drainage.  This is from the oxygen used during your procedure.  There is no need for concern and it should clear up in a day or so.  SYMPTOMS TO REPORT IMMEDIATELY:    Following upper endoscopy (EGD)  Vomiting of blood or coffee ground material  New chest pain or pain under the shoulder blades  Painful or persistently difficult swallowing  New shortness of breath  Fever of 100F or higher  Black, tarry-looking stools  For urgent or emergent issues, a gastroenterologist can be reached at any hour by calling [phone].   DIET:  We do recommend a small meal at first, but then you may proceed to your regular  diet.  Drink plenty of fluids but you should avoid alcoholic beverages for 24 hours.  ACTIVITY:  You should plan to take it easy for the rest of today and you should NOT DRIVE or use heavy machinery until tomorrow (because of the sedation medicines used during the test).    FOLLOW UP: Our staff will call the number listed on your records the next business day following your procedure to check on you and address any questions or concerns that you may have regarding the information given to you following your procedure. If we do not reach you, we will leave a message.  However, if you are feeling well and you are not experiencing any problems, there is no need to return our call.  We will assume that you have returned to your regular daily activities without incident.  If any biopsies were taken you will be contacted by phone or by letter within the next 1-3 weeks.  Please call us at [phone] if you have not heard about the biopsies in 3 weeks.    SIGNATURES/CONFIDENTIALITY: You and/or your care partner have signed paperwork which will be entered into your electronic medical record.  These signatures attest to the fact that that the information above on your After Visit Summary has been reviewed and is understood.  Full responsibility of the confidentiality of this discharge information lies with you and/or your care-partner.

## 2016-11-14 NOTE — Progress Notes (Signed)
Agree with Meredith Cole management. EGD was negative - seems like she had an infectious process that has resolved. Advised to come off PPI as was feeling fine at EGD. Gatha Mayer, MD, [facility]

## 2016-11-15 ENCOUNTER — Telehealth: Payer: Self-pay

## 2016-11-15 NOTE — Telephone Encounter (Signed)
  Follow up Call-  Call back number 11/12/2016 10/11/2016  Post procedure Call Back phone  # [phone] cell  Permission to leave phone message Yes Yes  Some recent data might be hidden     Patient questions:  Do you have a fever, pain , or abdominal swelling? No. Pain Score  0 *  Have you tolerated food without any problems? Yes.    Have you been able to return to your normal activities? Yes.    Do you have any questions about your discharge instructions: Diet   No. Medications  No. Follow up visit  No.  Do you have questions or concerns about your Care? No.  Actions: * If pain score is 4 or above: No action needed, pain <4.

## 2017-12-29 NOTE — Pre-Procedure Instructions (Signed)
Meredith Cole  12/29/2017      [facility] - [place], [place] AT [facility] OF [place] [address] [place] [address] Phone: [phone] Fax: [phone]    Your procedure is scheduled on Thursday 01/05/18 .  Report to [facility] Admitting at [place].M.  Call this number if you have problems the morning of surgery:  [phone]   Remember:  Do not eat food or drink liquids after midnight.  Take these medicines the morning of surgery with A SIP OF WATER: Tylenol if needed, Alprazolam if needed, Eye drops, Buproprion, Duloxetine, Flonase    7 days prior to surgery STOP taking any Aspirin(unless otherwise instructed by your surgeon), Aleve, Naproxen, Ibuprofen, Motrin, Advil, Goody's, BC's, all herbal medications, fish oil, and all vitamins    Do not wear jewelry, make-up or nail polish.  Do not wear lotions, powders, or perfumes, or deodorant.  Do not shave 48 hours prior to surgery.  Men may shave face and neck.  Do not bring valuables to the hospital.  [facility] is not responsible for any belongings or valuables.  Contacts, dentures or bridgework may not be worn into surgery.  Leave your suitcase in the car.  After surgery it may be brought to your room.  For patients admitted to the hospital, discharge time will be determined by your treatment team.  Patients discharged the day of surgery will not be allowed to drive home.   Special instructions: [place]- Preparing for Surgery Before surgery, you can play an important role.  Because skin is not sterile, your skin needs to be as free of germs as possible.  You can reduce the number of germs on your skin by washing with CHG (chlorahexidine gluconate) Soap before surgery.  CHG is an antiseptic cleaner which kills germs and bonds with the skin to continue killing germs even after washing. Please do not use if you have an allergy to CHG or  antibacterial soaps.  If your skin becomes reddened/irritated stop using the CHGDo not shave (including legs and underarms) for at least 48 hours prior to first CHG shower.  It is OK to shave your face. Please follow these instructions carefully. 1. Shower the night before surgery and the morning of surgery with CHG. 2. If you chose to wash your hair, wash your hair first as usual with your normal shampoo. 3. After you shampoo, rinse your hair and body thoroughly to remove the shampoo. 4. Use CHG as you would any other liquid soap.  You can apply CHG directly to the skin and wash gently with a scrungie or clean washcloth. 5. Apply the CHG Soap to your body ONLY FROM THE NECK DOWN.  Do not use on open wounds or open sores.  Avoid contact with your eyes, ears, mouth, and genitals (private parts).  Wash genitals (private parts) with your normal soap. 6. Wash thoroughly, paying special attention to the area where your surgery will be performed. 7. Thoroughly rise your body with warm water from the neck down. 8. DO NOT shower/wash with your normal soap after using and rinsing off the CHG Soap. 9. Pat yourself dry with a clean towel. 10. Wear clean pajamas. 11. Place clean sheets on your bed the night of your first shower and do not sleep with pets. Day of Surgery: Do not apply any deodorants/lotions.  Please wear clean clothes to the hospital/surgery center.  Please read over the following fact sheets that you were given. Pain Booklet, Coughing and Deep Breathing, MRSA Information and Surgical Site Infection Prevention

## 2017-12-30 ENCOUNTER — Other Ambulatory Visit: Payer: Self-pay

## 2017-12-30 ENCOUNTER — Encounter (HOSPITAL_COMMUNITY): Payer: Self-pay

## 2017-12-30 ENCOUNTER — Encounter (HOSPITAL_COMMUNITY)
Admission: RE | Admit: 2017-12-30 | Discharge: 2017-12-30 | Disposition: A | Discharge status: Home / Self Care | Payer: Medicare Other | Source: Ambulatory Visit | Attending: Orthopedic Surgery | Admitting: Orthopedic Surgery

## 2017-12-30 DIAGNOSIS — Z01810 Encounter for preprocedural cardiovascular examination: Secondary | ICD-10-CM | POA: Diagnosis present

## 2017-12-30 DIAGNOSIS — Z01812 Encounter for preprocedural laboratory examination: Secondary | ICD-10-CM | POA: Insufficient documentation

## 2017-12-30 HISTORY — DX: Anxiety disorder, unspecified: F41.9

## 2017-12-30 HISTORY — DX: Cyst of kidney, acquired: N28.1

## 2017-12-30 LAB — CBC
HEMATOCRIT: 42.7 % (ref 36.0–46.0)
Hemoglobin: 14.5 g/dL (ref 12.0–15.0)
MCH: 33.5 pg (ref 26.0–34.0)
MCHC: 34 g/dL (ref 30.0–36.0)
MCV: 98.6 fL (ref 78.0–100.0)
Platelets: 248 10*3/uL (ref 150–400)
RBC: 4.33 MIL/uL (ref 3.87–5.11)
RDW: 12.8 % (ref 11.5–15.5)
WBC: 6.7 10*3/uL (ref 4.0–10.5)

## 2017-12-30 LAB — BASIC METABOLIC PANEL
Anion gap: 11 (ref 5–15)
BUN: 19 mg/dL (ref 6–20)
CHLORIDE: 103 mmol/L (ref 101–111)
CO2: 25 mmol/L (ref 22–32)
Calcium: 9.4 mg/dL (ref 8.9–10.3)
Creatinine, Ser: 0.99 mg/dL (ref 0.44–1.00)
GFR, EST NON AFRICAN AMERICAN: 56 mL/min — AB (ref >60)
Glucose, Bld: 100 mg/dL — ABNORMAL HIGH (ref 65–99)
POTASSIUM: 3.2 mmol/L — AB (ref 3.5–5.1)
SODIUM: 139 mmol/L (ref 135–145)

## 2017-12-30 LAB — SURGICAL PCR SCREEN
MRSA, PCR: NEGATIVE
Staphylococcus aureus: NEGATIVE

## 2017-12-30 NOTE — H&P (Signed)
Patient ID: Meredith Cole MRN: [id number] DOB/AGE: 73-22-1947 73 y.o.  Admit date: (Not on file)  Admission Diagnoses:  Lumbar Spinal stenosis  HPI: H&P FOR BACK SURGERY.  Presents for her history and physical for her surgical intervention of decompression of L4-5 and Coflex insertion.  Reports treatment for cholesterol as well as situational stress and anxiety.  The patient reports a history of good health.  Past Medical History: Past Medical History:  Diagnosis Date  . Allergy   . Anxiety   . Arthritis    back  . Cyst of right kidney   . Hyperlipidemia   . Hypertension   . Melanoma ([employer]) 2016  . 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 Trichuriasis - whipworm 2009   Incidental at colonoscopy - treated with mebendazole  . Vertigo     Surgical History: Past Surgical History:  Procedure Laterality Date  . ARM WOUND REPAIR / CLOSURE     WRIST  BROKE WAS REPAIRED  . bone plate and graft left upper arm    . CATARACT EXTRACTION W/ INTRAOCULAR LENS  IMPLANT, BILATERAL     GLAUCOMA  PROC  . CESAREAN SECTION    . CHOLECYSTECTOMY    . COLON SURGERY  01/2008   Dr. Zettie Pho; right hemi-colectomy for tubulovillous adenoma  . COLONOSCOPY  2002, 2008, 2009 and 06/28/2011   2002: 51mm polyp, 2008-2009: large ascending tubulovillous adenoma. 2012: diverticulosis, external hemorrhoids  . MELANOMA EXCISION     face    Family History: Family History  Problem Relation Age of Onset  . Colon cancer Mother 70       died at age 37  . Heart disease Father   . Esophageal cancer Neg Hx   . Pancreatic cancer Neg Hx   . Rectal cancer Neg Hx   . Stomach cancer Neg Hx     Social History: Social History   Socioeconomic History  . Marital status: Married    Spouse name: Not on file  . Number of children: Not on file  . Years of education: Not on file  . Highest education level: Not on file  Social Needs  . Financial resource strain: Not on file  . Food  insecurity - worry: Not on file  . Food insecurity - inability: Not on file  . Transportation needs - medical: Not on file  . Transportation needs - non-medical: Not on file  Occupational History  . Not on file  Tobacco Use  . Smoking status: Former Smoker    Packs/day: 1.00    Years: 3.00    Pack years: 3.00    Types: Cigarettes    Last attempt to quit: 09/27/1972    Years since quitting: 45.2  . Smokeless tobacco: Never Used  Substance and Sexual Activity  . Alcohol use: Yes    Alcohol/week: 1.2 oz    Types: 2 Glasses of wine per week  . Drug use: No  . Sexual activity: Not on file  Other Topics Concern  . Not on file  Social History Narrative  . Not on file    Allergies: Patient has no known allergies.  Medications: I have reviewed the patient's current medications.  Vital Signs: No data found.  Radiology: No results found.  Labs: No results for input(s): WBC, RBC, HCT, PLT in the last 72 hours. No results for input(s): NA, K, CL, CO2, BUN, CREATININE, GLUCOSE, CALCIUM in the last 72 hours. No results for input(s): LABPT, INR in the  last 72 hours.  Review of Systems: ROS  Physical Exam: Patient is alert 3 no shortness of breath lungs are clear to auscultation no chest pain regular rate and rhythm noted on auscultation abdomen is soft and nontender tenderness elicited to palpation of the lumbar paraspinals pain of elicited with extension relieved with flexion ambulation increases pain no motor deficits no sensation changes.  Bowel and bladder dysfunction.  Does not use any assistive devices  for ambulation   Assessment and Plan: Risks and benefits of surgery were discussed with the patient. These include: Infection, bleeding, death, stroke, paralysis, ongoing or worse pain, need for additional surgery, leak of spinal fluid, adjacent segment degeneration requiring additional surgery, post-operative hematoma formation that can result in neurological compromise and  the need for urgent/emergent re-operation. Loss in bowel and bladder control. Injury to major vessels that could result in the need for urgent abdominal surgery to stop bleeding. Risk of deep venous thrombosis (DVT) and the need for additional treatment. Recurrent disc herniation resulting in the need for revision surgery, which could include fusion surgery (utilizing instrumentation such as pedicle screws and intervertebral cages).  Additional risk: If instrumentation is used to address spinal stenosis there is a risk of migration, or breakage of that hardware that could require additional surgery.  Goal of surgery: Reduce (not eliminate) pain, and improve quality of life.   Patient's MRI is significant for spinal stenosis at L4-5  Should not will receive her LSO at her physical therapy appointment she understands to bring to the hospital on the day of surgery all questions were elicited and answered.   Ronette Deter, PAC for Melina Schools, MD [place] [phone]  There is been no change in the patient's clinical exam.  She continues to have lumbar spinal stenosis with neurogenic claudication.  Unfortunately her insurance company recently (2 days ago) denied the use of the Coflex device.  Because of the severe pain with loss of quality of life the patient would like to move forward with a straightforward lumbar decompression.  I do believe this will address her spinal stenosis and neurogenic claudication but I did discuss the potential risk of current spinal stenosis.  As her disc degenerates there can be recurrent spinal stenosis that could warrant surgical intervention.

## 2017-12-30 NOTE — Progress Notes (Signed)
PCP-  DR. DONNA GATES WITH [facility]. (CLEARED PATIENT FOR SURGERY  PER PATIENT).  PATIENT STATED SHE HAD STRESS TEST IN 1990S DUE TO SISTER HAVING HEART BYPASS SURGERY. (TO RULE OUT ANY CARDIAC PROBLEM)

## 2018-01-02 NOTE — Progress Notes (Signed)
Anesthesia Chart Review:  Pt is a 72 year old female scheduled for L4-5 decompression, coflex on 01/05/2018 with Melina Schools, MD  - PCP is Darcus Austin, MD who cleared pt for surgery.   PMH includes:  HTN, hyperlipidemia, melanoma.  Former smoker (quit 1973). BMI 31  Medications include: ASA 81 mg, Lipitor, HCTZ  BP (!) 150/74   Pulse 88   Temp (!) 36.4 C   Resp 20   Ht 5' 5.5" (1.664 m)   Wt 191 lb 4.8 oz (86.8 kg)   SpO2 98%   BMI 31.35 kg/m    Preoperative labs reviewed.    EKG 12/30/17: Sinus rhythm with PACs   Holter monitor 01/24/16:  - SR  62 to 102 bpm  Average HR 73 bpm  Occasional PAC and PVC   If no changes, I anticipate pt can proceed with surgery as scheduled.   Willeen Cass, FNP-BC [facility]/Anesthesiology Phone: [phone] 01/02/2018 1:14 PM

## 2018-01-05 ENCOUNTER — Ambulatory Visit (HOSPITAL_COMMUNITY): Payer: Medicare Other | Admitting: Certified Registered"

## 2018-01-05 ENCOUNTER — Encounter (HOSPITAL_COMMUNITY)
Admission: RE | Disposition: A | Discharge status: Home / Self Care | Payer: Self-pay | Source: Ambulatory Visit | Attending: Orthopedic Surgery

## 2018-01-05 ENCOUNTER — Encounter (HOSPITAL_COMMUNITY): Payer: Self-pay

## 2018-01-05 ENCOUNTER — Other Ambulatory Visit: Payer: Self-pay

## 2018-01-05 ENCOUNTER — Observation Stay (HOSPITAL_COMMUNITY)
Admission: RE | Admit: 2018-01-05 | Discharge: 2018-01-06 | Disposition: A | Discharge status: Home / Self Care | Payer: Medicare Other | Source: Ambulatory Visit | Attending: Orthopedic Surgery | Admitting: Orthopedic Surgery

## 2018-01-05 ENCOUNTER — Ambulatory Visit (HOSPITAL_COMMUNITY): Payer: Medicare Other | Admitting: Vascular Surgery

## 2018-01-05 ENCOUNTER — Ambulatory Visit (HOSPITAL_COMMUNITY): Payer: Medicare Other

## 2018-01-05 DIAGNOSIS — M48062 Spinal stenosis, lumbar region with neurogenic claudication: Secondary | ICD-10-CM | POA: Diagnosis present

## 2018-01-05 DIAGNOSIS — I10 Essential (primary) hypertension: Secondary | ICD-10-CM | POA: Insufficient documentation

## 2018-01-05 DIAGNOSIS — F419 Anxiety disorder, unspecified: Secondary | ICD-10-CM | POA: Diagnosis not present

## 2018-01-05 DIAGNOSIS — Z79899 Other long term (current) drug therapy: Secondary | ICD-10-CM | POA: Insufficient documentation

## 2018-01-05 DIAGNOSIS — Z85820 Personal history of malignant melanoma of skin: Secondary | ICD-10-CM | POA: Diagnosis not present

## 2018-01-05 DIAGNOSIS — E785 Hyperlipidemia, unspecified: Secondary | ICD-10-CM | POA: Insufficient documentation

## 2018-01-05 DIAGNOSIS — R2681 Unsteadiness on feet: Secondary | ICD-10-CM | POA: Insufficient documentation

## 2018-01-05 DIAGNOSIS — Z9889 Other specified postprocedural states: Secondary | ICD-10-CM

## 2018-01-05 DIAGNOSIS — Z87891 Personal history of nicotine dependence: Secondary | ICD-10-CM | POA: Diagnosis not present

## 2018-01-05 DIAGNOSIS — Z419 Encounter for procedure for purposes other than remedying health state, unspecified: Secondary | ICD-10-CM

## 2018-01-05 HISTORY — PX: LUMBAR LAMINECTOMY/DECOMPRESSION MICRODISCECTOMY: SHX5026

## 2018-01-05 SURGERY — LUMBAR LAMINECTOMY/DECOMPRESSION MICRODISCECTOMY 1 LEVEL
Anesthesia: General | Site: Back

## 2018-01-05 MED ORDER — ONDANSETRON HCL 4 MG/2ML IJ SOLN: 4.0000 mg | Freq: Four times a day (QID) | INTRAMUSCULAR | Status: DC | PRN

## 2018-01-05 MED ORDER — CEFAZOLIN SODIUM-DEXTROSE 2-4 GM/100ML-% IV SOLN
INTRAVENOUS | Status: AC
  Filled 2018-01-05: qty 100

## 2018-01-05 MED ORDER — HYDROMORPHONE HCL 1 MG/ML IJ SOLN
0.2500 mg | INTRAMUSCULAR | Status: DC | PRN
  Administered 2018-01-05 (×2): 0.25 mg via INTRAVENOUS

## 2018-01-05 MED ORDER — THROMBIN (RECOMBINANT) 20000 UNITS EX SOLR
CUTANEOUS | Status: DC | PRN
  Administered 2018-01-05: 20000 [IU] via TOPICAL

## 2018-01-05 MED ORDER — PHENYLEPHRINE 40 MCG/ML (10ML) SYRINGE FOR IV PUSH (FOR BLOOD PRESSURE SUPPORT)
PREFILLED_SYRINGE | INTRAVENOUS | Status: DC | PRN
  Administered 2018-01-05: 80 ug via INTRAVENOUS

## 2018-01-05 MED ORDER — MENTHOL 3 MG MT LOZG: 1.0000 | LOZENGE | OROMUCOSAL | Status: DC | PRN

## 2018-01-05 MED ORDER — FENTANYL CITRATE (PF) 250 MCG/5ML IJ SOLN
INTRAMUSCULAR | Status: AC
  Filled 2018-01-05: qty 5

## 2018-01-05 MED ORDER — SODIUM CHLORIDE 0.9% FLUSH: 3.0000 mL | INTRAVENOUS | Status: DC | PRN

## 2018-01-05 MED ORDER — ROCURONIUM BROMIDE 10 MG/ML (PF) SYRINGE
PREFILLED_SYRINGE | INTRAVENOUS | Status: DC | PRN
  Administered 2018-01-05: 50 mg via INTRAVENOUS
  Administered 2018-01-05 (×2): 10 mg via INTRAVENOUS

## 2018-01-05 MED ORDER — LIDOCAINE 2% (20 MG/ML) 5 ML SYRINGE
INTRAMUSCULAR | Status: AC
  Filled 2018-01-05: qty 15

## 2018-01-05 MED ORDER — ONDANSETRON HCL 4 MG PO TABS: 4.0000 mg | ORAL_TABLET | Freq: Four times a day (QID) | ORAL | Status: DC | PRN

## 2018-01-05 MED ORDER — FLUTICASONE PROPIONATE 50 MCG/ACT NA SUSP: 1.0000 | Freq: Every day | NASAL | Status: DC | PRN

## 2018-01-05 MED ORDER — HYDROMORPHONE HCL 1 MG/ML IJ SOLN
INTRAMUSCULAR | Status: AC
  Filled 2018-01-05: qty 1

## 2018-01-05 MED ORDER — CARBOXYMETHYLCELLUL-GLYCERIN 1-0.9 % OP GEL: 1.0000 [drp] | Freq: Three times a day (TID) | OPHTHALMIC | Status: DC | PRN

## 2018-01-05 MED ORDER — SUGAMMADEX SODIUM 200 MG/2ML IV SOLN
INTRAVENOUS | Status: AC
  Filled 2018-01-05: qty 2

## 2018-01-05 MED ORDER — BRIMONIDINE TARTRATE 0.2 % OP SOLN
1.0000 [drp] | Freq: Two times a day (BID) | OPHTHALMIC | Status: DC
  Filled 2018-01-05: qty 5

## 2018-01-05 MED ORDER — SODIUM CHLORIDE 0.9% FLUSH
3.0000 mL | Freq: Two times a day (BID) | INTRAVENOUS | Status: DC
  Administered 2018-01-05: 3 mL via INTRAVENOUS

## 2018-01-05 MED ORDER — ALPRAZOLAM 0.5 MG PO TABS
0.5000 mg | ORAL_TABLET | Freq: Every day | ORAL | Status: DC
  Filled 2018-01-05: qty 1

## 2018-01-05 MED ORDER — METHOCARBAMOL 1000 MG/10ML IJ SOLN
500.0000 mg | Freq: Four times a day (QID) | INTRAMUSCULAR | Status: DC | PRN
  Filled 2018-01-05: qty 5

## 2018-01-05 MED ORDER — DEXAMETHASONE SODIUM PHOSPHATE 10 MG/ML IJ SOLN
INTRAMUSCULAR | Status: AC
  Filled 2018-01-05: qty 1

## 2018-01-05 MED ORDER — MEPERIDINE HCL 25 MG/ML IJ SOLN: 6.2500 mg | INTRAMUSCULAR | Status: DC | PRN

## 2018-01-05 MED ORDER — BUPIVACAINE-EPINEPHRINE (PF) 0.5% -1:200000 IJ SOLN
INTRAMUSCULAR | Status: AC
  Filled 2018-01-05: qty 30

## 2018-01-05 MED ORDER — MIDAZOLAM HCL 5 MG/5ML IJ SOLN
INTRAMUSCULAR | Status: DC | PRN
  Administered 2018-01-05: 2 mg via INTRAVENOUS

## 2018-01-05 MED ORDER — HEMOSTATIC AGENTS (NO CHARGE) OPTIME
TOPICAL | Status: DC | PRN
  Administered 2018-01-05: 1 via TOPICAL

## 2018-01-05 MED ORDER — DULOXETINE HCL 30 MG PO CPEP
60.0000 mg | ORAL_CAPSULE | Freq: Every day | ORAL | Status: DC
  Filled 2018-01-05 (×2): qty 2

## 2018-01-05 MED ORDER — SODIUM CHLORIDE 0.9 % IV SOLN: 250.0000 mL | INTRAVENOUS | Status: DC

## 2018-01-05 MED ORDER — ROCURONIUM BROMIDE 10 MG/ML (PF) SYRINGE
PREFILLED_SYRINGE | INTRAVENOUS | Status: AC
  Filled 2018-01-05: qty 5

## 2018-01-05 MED ORDER — ACETAMINOPHEN 10 MG/ML IV SOLN
INTRAVENOUS | Status: AC
  Filled 2018-01-05: qty 100

## 2018-01-05 MED ORDER — ONDANSETRON HCL 4 MG/2ML IJ SOLN
INTRAMUSCULAR | Status: AC
  Filled 2018-01-05: qty 2

## 2018-01-05 MED ORDER — LACTATED RINGERS IV SOLN
INTRAVENOUS | Status: DC
  Administered 2018-01-05 (×2): via INTRAVENOUS

## 2018-01-05 MED ORDER — SUGAMMADEX SODIUM 200 MG/2ML IV SOLN
INTRAVENOUS | Status: DC | PRN
  Administered 2018-01-05: 173.6 mg via INTRAVENOUS

## 2018-01-05 MED ORDER — CEFAZOLIN SODIUM-DEXTROSE 2-4 GM/100ML-% IV SOLN
2.0000 g | Freq: Three times a day (TID) | INTRAVENOUS | Status: AC
  Administered 2018-01-05 – 2018-01-06 (×2): 2 g via INTRAVENOUS
  Filled 2018-01-05 (×2): qty 100

## 2018-01-05 MED ORDER — BUPIVACAINE-EPINEPHRINE (PF) 0.5% -1:200000 IJ SOLN
INTRAMUSCULAR | Status: DC | PRN
  Administered 2018-01-05: 10 mL via PERINEURAL

## 2018-01-05 MED ORDER — METHOCARBAMOL 500 MG PO TABS
500.0000 mg | ORAL_TABLET | Freq: Four times a day (QID) | ORAL | Status: DC | PRN
  Administered 2018-01-05 – 2018-01-06 (×2): 500 mg via ORAL
  Filled 2018-01-05 (×2): qty 1

## 2018-01-05 MED ORDER — ACETAMINOPHEN 650 MG RE SUPP: 650.0000 mg | RECTAL | Status: DC | PRN

## 2018-01-05 MED ORDER — LORATADINE 10 MG PO TABS
10.0000 mg | ORAL_TABLET | Freq: Every day | ORAL | Status: DC
  Filled 2018-01-05: qty 1

## 2018-01-05 MED ORDER — OXYCODONE HCL 5 MG PO TABS: 5.0000 mg | ORAL_TABLET | ORAL | Status: DC | PRN

## 2018-01-05 MED ORDER — MORPHINE SULFATE (PF) 4 MG/ML IV SOLN: 1.0000 mg | INTRAVENOUS | Status: DC | PRN

## 2018-01-05 MED ORDER — BUPROPION HCL ER (XL) 150 MG PO TB24
150.0000 mg | ORAL_TABLET | Freq: Every day | ORAL | Status: DC
  Filled 2018-01-05 (×2): qty 1

## 2018-01-05 MED ORDER — DEXAMETHASONE SODIUM PHOSPHATE 10 MG/ML IJ SOLN
INTRAMUSCULAR | Status: DC | PRN
  Administered 2018-01-05: 10 mg via INTRAVENOUS

## 2018-01-05 MED ORDER — PROPOFOL 10 MG/ML IV BOLUS
INTRAVENOUS | Status: DC | PRN
  Administered 2018-01-05: 150 mg via INTRAVENOUS

## 2018-01-05 MED ORDER — OXYCODONE HCL 5 MG PO TABS
10.0000 mg | ORAL_TABLET | ORAL | Status: DC | PRN
  Administered 2018-01-05 – 2018-01-06 (×3): 10 mg via ORAL
  Filled 2018-01-05 (×3): qty 2

## 2018-01-05 MED ORDER — LIDOCAINE 2% (20 MG/ML) 5 ML SYRINGE
INTRAMUSCULAR | Status: DC | PRN
  Administered 2018-01-05: 60 mg via INTRAVENOUS

## 2018-01-05 MED ORDER — MIDAZOLAM HCL 2 MG/2ML IJ SOLN
INTRAMUSCULAR | Status: AC
  Filled 2018-01-05: qty 2

## 2018-01-05 MED ORDER — MAGNESIUM CITRATE PO SOLN
1.0000 | Freq: Once | ORAL | Status: AC | PRN
  Administered 2018-01-05: 1 via ORAL
  Filled 2018-01-05: qty 296

## 2018-01-05 MED ORDER — CEFAZOLIN SODIUM-DEXTROSE 2-4 GM/100ML-% IV SOLN
2.0000 g | INTRAVENOUS | Status: AC
  Administered 2018-01-05: 2 g via INTRAVENOUS

## 2018-01-05 MED ORDER — PHENYLEPHRINE 40 MCG/ML (10ML) SYRINGE FOR IV PUSH (FOR BLOOD PRESSURE SUPPORT)
PREFILLED_SYRINGE | INTRAVENOUS | Status: AC
  Filled 2018-01-05: qty 10

## 2018-01-05 MED ORDER — ACETAMINOPHEN 325 MG PO TABS: 650.0000 mg | ORAL_TABLET | ORAL | Status: DC | PRN

## 2018-01-05 MED ORDER — ACETAMINOPHEN 10 MG/ML IV SOLN
INTRAVENOUS | Status: DC | PRN
  Administered 2018-01-05: 1000 mg via INTRAVENOUS

## 2018-01-05 MED ORDER — HYDROCHLOROTHIAZIDE 12.5 MG PO CAPS
12.5000 mg | ORAL_CAPSULE | Freq: Every day | ORAL | Status: DC
  Filled 2018-01-05: qty 1

## 2018-01-05 MED ORDER — LATANOPROST 0.005 % OP SOLN
1.0000 [drp] | Freq: Every day | OPHTHALMIC | Status: DC
  Filled 2018-01-05: qty 2.5

## 2018-01-05 MED ORDER — OXYCODONE-ACETAMINOPHEN 10-325 MG PO TABS: 1.0000 | ORAL_TABLET | ORAL | 0 refills | Status: AC | PRN

## 2018-01-05 MED ORDER — FENTANYL CITRATE (PF) 250 MCG/5ML IJ SOLN
INTRAMUSCULAR | Status: DC | PRN
  Administered 2018-01-05 (×2): 50 ug via INTRAVENOUS

## 2018-01-05 MED ORDER — LACTATED RINGERS IV SOLN: INTRAVENOUS | Status: DC

## 2018-01-05 MED ORDER — ONDANSETRON HCL 4 MG/2ML IJ SOLN
INTRAMUSCULAR | Status: DC | PRN
  Administered 2018-01-05: 4 mg via INTRAVENOUS

## 2018-01-05 MED ORDER — METHOCARBAMOL 500 MG PO TABS: 500.0000 mg | ORAL_TABLET | Freq: Three times a day (TID) | ORAL | 0 refills | Status: DC

## 2018-01-05 MED ORDER — BRIMONIDINE TARTRATE-TIMOLOL 0.2-0.5 % OP SOLN
1.0000 [drp] | Freq: Two times a day (BID) | OPHTHALMIC | Status: DC
  Filled 2018-01-05: qty 5

## 2018-01-05 MED ORDER — THROMBIN (RECOMBINANT) 20000 UNITS EX SOLR
CUTANEOUS | Status: AC
  Filled 2018-01-05: qty 20000

## 2018-01-05 MED ORDER — DOCUSATE SODIUM 100 MG PO CAPS
100.0000 mg | ORAL_CAPSULE | Freq: Two times a day (BID) | ORAL | Status: DC
  Administered 2018-01-05: 100 mg via ORAL
  Filled 2018-01-05 (×2): qty 1

## 2018-01-05 MED ORDER — POLYVINYL ALCOHOL 1.4 % OP SOLN
1.0000 [drp] | Freq: Three times a day (TID) | OPHTHALMIC | Status: DC | PRN
  Filled 2018-01-05: qty 15

## 2018-01-05 MED ORDER — PROMETHAZINE HCL 25 MG/ML IJ SOLN: 6.2500 mg | INTRAMUSCULAR | Status: DC | PRN

## 2018-01-05 MED ORDER — ATORVASTATIN CALCIUM 20 MG PO TABS
20.0000 mg | ORAL_TABLET | Freq: Every day | ORAL | Status: DC
  Filled 2018-01-05: qty 1

## 2018-01-05 MED ORDER — ONDANSETRON 4 MG PO TBDP: 4.0000 mg | ORAL_TABLET | Freq: Three times a day (TID) | ORAL | 0 refills | Status: DC | PRN

## 2018-01-05 MED ORDER — POLYETHYLENE GLYCOL 3350 17 G PO PACK: 17.0000 g | PACK | Freq: Every day | ORAL | Status: DC | PRN

## 2018-01-05 MED ORDER — TIMOLOL MALEATE 0.5 % OP SOLN
1.0000 [drp] | Freq: Two times a day (BID) | OPHTHALMIC | Status: DC
  Filled 2018-01-05: qty 5

## 2018-01-05 MED ORDER — PHENOL 1.4 % MT LIQD: 1.0000 | OROMUCOSAL | Status: DC | PRN

## 2018-01-05 MED ORDER — PROPOFOL 10 MG/ML IV BOLUS
INTRAVENOUS | Status: AC
  Filled 2018-01-05: qty 20

## 2018-01-05 SURGICAL SUPPLY — 67 items
BUR EGG ELITE 4.0 (BURR) IMPLANT
BUR EGG ELITE 4.0MM (BURR)
BUR MATCHSTICK NEURO 3.0 LAGG (BURR) IMPLANT
CANISTER SUCT 3000ML PPV (MISCELLANEOUS) ×3 IMPLANT
CLOSURE STERI-STRIP 1/2X4 (GAUZE/BANDAGES/DRESSINGS) ×1
CLOSURE WOUND 1/2 X4 (GAUZE/BANDAGES/DRESSINGS) ×1
CLSR STERI-STRIP ANTIMIC 1/2X4 (GAUZE/BANDAGES/DRESSINGS) ×2 IMPLANT
CORD BI POLAR (MISCELLANEOUS) ×3 IMPLANT
COVER SURGICAL LIGHT HANDLE (MISCELLANEOUS) ×3 IMPLANT
DRAIN CHANNEL 15F RND FF W/TCR (WOUND CARE) IMPLANT
DRAPE POUCH INSTRU U-SHP 10X18 (DRAPES) ×3 IMPLANT
DRAPE SURG 17X23 STRL (DRAPES) ×3 IMPLANT
DRAPE U-SHAPE 47X51 STRL (DRAPES) ×3 IMPLANT
DRSG AQUACEL AG ADV 3.5X 4 (GAUZE/BANDAGES/DRESSINGS) IMPLANT
DRSG AQUACEL AG ADV 3.5X 6 (GAUZE/BANDAGES/DRESSINGS) IMPLANT
DRSG OPSITE POSTOP 4X6 (GAUZE/BANDAGES/DRESSINGS) ×3 IMPLANT
DURAPREP 26ML APPLICATOR (WOUND CARE) ×3 IMPLANT
ELECT BLADE 4.0 EZ CLEAN MEGAD (MISCELLANEOUS)
ELECT CAUTERY BLADE 6.4 (BLADE) ×3 IMPLANT
ELECT PENCIL ROCKER SW 15FT (MISCELLANEOUS) ×3 IMPLANT
ELECT REM PT RETURN 9FT ADLT (ELECTROSURGICAL) ×3
ELECTRODE BLDE 4.0 EZ CLN MEGD (MISCELLANEOUS) IMPLANT
ELECTRODE REM PT RTRN 9FT ADLT (ELECTROSURGICAL) ×1 IMPLANT
EVACUATOR SILICONE 100CC (DRAIN) IMPLANT
GLOVE BIO SURGEON STRL SZ 6.5 (GLOVE) ×2 IMPLANT
GLOVE BIO SURGEONS STRL SZ 6.5 (GLOVE) ×1
GLOVE BIOGEL PI IND STRL 6.5 (GLOVE) ×1 IMPLANT
GLOVE BIOGEL PI IND STRL 8.5 (GLOVE) ×1 IMPLANT
GLOVE BIOGEL PI INDICATOR 6.5 (GLOVE) ×2
GLOVE BIOGEL PI INDICATOR 8.5 (GLOVE) ×2
GLOVE SS BIOGEL STRL SZ 8.5 (GLOVE) ×1 IMPLANT
GLOVE SUPERSENSE BIOGEL SZ 8.5 (GLOVE) ×2
GOWN STRL REUS W/TWL 2XL LVL3 (GOWN DISPOSABLE) ×3 IMPLANT
KIT BASIN OR (CUSTOM PROCEDURE TRAY) ×3 IMPLANT
KIT ROOM TURNOVER OR (KITS) ×3 IMPLANT
NEEDLE 22X1 1/2 (OR ONLY) (NEEDLE) ×3 IMPLANT
NEEDLE SPNL 18GX3.5 QUINCKE PK (NEEDLE) ×6 IMPLANT
NS IRRIG 1000ML POUR BTL (IV SOLUTION) ×3 IMPLANT
PACK LAMINECTOMY ORTHO (CUSTOM PROCEDURE TRAY) ×3 IMPLANT
PACK UNIVERSAL I (CUSTOM PROCEDURE TRAY) ×3 IMPLANT
PAD ARMBOARD 7.5X6 YLW CONV (MISCELLANEOUS) ×6 IMPLANT
PATTIES SURGICAL .5 X.5 (GAUZE/BANDAGES/DRESSINGS) IMPLANT
PATTIES SURGICAL .5 X1 (DISPOSABLE) ×3 IMPLANT
SPONGE SURGIFOAM ABS GEL 100 (HEMOSTASIS) IMPLANT
STRIP CLOSURE SKIN 1/2X4 (GAUZE/BANDAGES/DRESSINGS) ×2 IMPLANT
SURGIFLO W/THROMBIN 8M KIT (HEMOSTASIS) ×3 IMPLANT
SUT BONE WAX W31G (SUTURE) ×3 IMPLANT
SUT MON AB 3-0 SH 27 (SUTURE)
SUT MON AB 3-0 SH27 (SUTURE) IMPLANT
SUT STRATAFIX 1PDS 45CM VIOLET (SUTURE) IMPLANT
SUT STRATAFIX MNCRL+ 3-0 PS-2 (SUTURE)
SUT STRATAFIX MONOCRYL 3-0 (SUTURE)
SUT STRATAFIX SPIRAL + 2-0 (SUTURE) IMPLANT
SUT VIC AB 0 CT1 27 (SUTURE)
SUT VIC AB 0 CT1 27XBRD ANBCTR (SUTURE) IMPLANT
SUT VIC AB 1 CT1 18XCR BRD 8 (SUTURE) ×1 IMPLANT
SUT VIC AB 1 CT1 8-18 (SUTURE) ×2
SUT VIC AB 1 CTX 36 (SUTURE)
SUT VIC AB 1 CTX36XBRD ANBCTR (SUTURE) IMPLANT
SUT VIC AB 2-0 CT1 18 (SUTURE) ×3 IMPLANT
SUTURE STRATFX MNCRL+ 3-0 PS-2 (SUTURE) IMPLANT
SYR BULB IRRIGATION 50ML (SYRINGE) ×3 IMPLANT
SYR CONTROL 10ML LL (SYRINGE) ×3 IMPLANT
TOWEL OR 17X24 6PK STRL BLUE (TOWEL DISPOSABLE) ×3 IMPLANT
TOWEL OR 17X26 10 PK STRL BLUE (TOWEL DISPOSABLE) ×3 IMPLANT
WATER STERILE IRR 1000ML POUR (IV SOLUTION) ×3 IMPLANT
YANKAUER SUCT BULB TIP NO VENT (SUCTIONS) ×3 IMPLANT

## 2018-01-05 NOTE — Progress Notes (Signed)
Orthopedic Tech Progress Note Patient Details:  Meredith Cole 1946/02/19 [id number] Patient was supplied a back brace. Patient ID: Meredith Cole, female   DOB: Aug 21, 1946, 72 y.o.   MRN: [id number]   Braulio Bosch 01/05/2018, 3:43 PM

## 2018-01-05 NOTE — Anesthesia Preprocedure Evaluation (Signed)
Anesthesia Evaluation  Patient identified by MRN, date of birth, ID band Patient awake    Reviewed: Allergy & Precautions, NPO status , Patient's Chart, lab work & pertinent test results  Airway Mallampati: II  TM Distance: >3 FB Neck ROM: Full    Dental no notable dental hx.    Pulmonary neg pulmonary ROS, former smoker,    Pulmonary exam normal breath sounds clear to auscultation       Cardiovascular hypertension, Pt. on medications Normal cardiovascular exam Rhythm:Regular Rate:Normal     Neuro/Psych PSYCHIATRIC DISORDERS Anxiety negative neurological ROS     GI/Hepatic negative GI ROS, Neg liver ROS,   Endo/Other  negative endocrine ROS  Renal/GU Renal disease     Musculoskeletal  (+) Arthritis ,   Abdominal   Peds  Hematology negative hematology ROS (+)   Anesthesia Other Findings   Reproductive/Obstetrics negative OB ROS                            Anesthesia Physical Anesthesia Plan  ASA: II  Anesthesia Plan: General   Post-op Pain Management:    Induction: Intravenous  PONV Risk Score and Plan: 3 and Ondansetron and Dexamethasone  Airway Management Planned: Oral ETT  Additional Equipment:   Intra-op Plan:   Post-operative Plan: Extubation in OR  Informed Consent: I have reviewed the patients History and Physical, chart, labs and discussed the procedure including the risks, benefits and alternatives for the proposed anesthesia with the patient or authorized representative who has indicated his/her understanding and acceptance.   Dental advisory given  Plan Discussed with: CRNA  Anesthesia Plan Comments:         Anesthesia Quick Evaluation

## 2018-01-05 NOTE — Brief Op Note (Signed)
01/05/2018  12:42 PM  PATIENT:  Meredith Cole  73 y.o. female  PRE-OPERATIVE DIAGNOSIS:  Lumbar spinal stenosis L4-5  POST-OPERATIVE DIAGNOSIS:  spinal stenosis Lumbar four- Lumbar five  PROCEDURE:  Procedure(s) with comments: Decompression Lumbar 4-Lumbar 5  (N/A) - 120 mins  SURGEON:  Surgeon(s) and Role:    Melina Schools, MD - Primary  PHYSICIAN ASSISTANT:   ASSISTANTS: Carmen Mayo   ANESTHESIA:   general  EBL:  100 mL   BLOOD ADMINISTERED:none  DRAINS: none   LOCAL MEDICATIONS USED:  MARCAINE     SPECIMEN:  No Specimen  DISPOSITION OF SPECIMEN:  N/A  COUNTS:  YES  TOURNIQUET:  * No tourniquets in log *  DICTATION: .Dragon Dictation  PLAN OF CARE: Admit for overnight observation  PATIENT DISPOSITION:  PACU - hemodynamically stable.

## 2018-01-05 NOTE — Op Note (Signed)
Operative report  Preoperative diagnosis: Lumbar spinal stenosis with neurogenic claudication L4-5.  Postoperative diagnosis: Same  Operative report on lumbar decompression L4-5 with central and lateral recess decompression. Medial facetectomy and foraminotomy.   First assistant [place], [place].  Complications: None.  Indications: This is a very pleasant 72 year old woman who is been having progressive back buttock and bilateral leg pain.  Despite appropriate conservative treatment her overall quality of life and pain level is continued to diminish.  She is MRI demonstrated severe lumbar spinal stenosis L4-5 with foraminal and lateral recess compromise.  As result of the clinical findings we elected to move forward with surgery.  All appropriate risks benefits and alternatives were discussed with the patient and consent was obtained.  Operative note: Patient was brought the operating room placed upon the operating room table.  After successful induction of general anesthesia endotracheal intubation teds SCDs were applied and she was turned prone onto the Wilson frame.  All bony prominences well-padded and the back was prepped and draped in a standard fashion.  Timeout was taken to confirm patient procedure and all other important data.  Once this was done 2 needles were placed into the back and an x-ray was taken to confirm incision site.  The incision site was marked out and infiltrated with quarter percent Marcaine with epinephrine.  Midline incision was made and sharp dissection was carried out down to the deep fascia.  Deep fascia was sharply incised and I stripped the paraspinal muscles to expose the L4 and L5 spinous process and facet complex bilaterally.  There was significant synovial cysts off of both the left and right 4 5 facet complexes.  These were debrided and removed.  I then placed a Penfield 4 underneath the L4 lamina and took a second x-ray to confirm my level.  Once this was done I  used a double-action Leksell rongeur to remove the inferior half of the L4 spinous process and the superior third of the L5 spinous process.  There was significant thickening of the ligamentum flavum noted.  I gently dissected down through the central raphae and remove the ligamentum flavum with my Kerrison rongeurs to expose the dorsal surface of the thecal sac.  I continued decompressing into the lateral recess using my Kerrison to resect the ligamentum flavum.  I then continued into the lateral recess until I could visualize the medial side of the pedicle of L5.  I then identified the L5 nerve root as it was traversing and going towards the foramen and performed decompress this into the foramen.  I then resected the superior portion of the L5 spinous process and lamina in order to adequately decompress.  I then carried my dissection superiorly until I was above the L4-5 disc space.  I then continued to make sure I had adequate decompression I.  At this point I spanned from the inferior aspect of the L4 pedicle into the L5 foramen.  This correlated with the area of maximal stenosis based on the preoperative MRI.  Once I had an excellent bilateral decompression, medial facetectomy and foraminotomy done I then confirmed an adequate decompression.  I could easily pass my Woodson elevator superiorly in the lateral recess inferiorly in the lateral recess and out the L5 foramen bilaterally.  I could also pass the circumferentially at the level of the disc space.  At this point is very pleased with my overall decompression.  I then used bipolar cautery and FloSeal to obtain and maintain hemostasis.  The  wound was then copiously irrigated with normal saline and a thrombin-soaked Gelfoam patty was placed over the laminotomy site.  I then closed the deep fascia with interrupted #1 Vicryl sutures then a layer 2-0 Vicryl sutures and finally a 3-0 Monocryl.  Steri-Strips dry dressing were applied and the patient was ultimately  extubated and transferred the PACU without incident.  The end of the case all needle sponge counts were correct.

## 2018-01-05 NOTE — Evaluation (Signed)
Physical Therapy Evaluation Patient Details Name: Meredith Cole MRN: [id number] DOB: Mar 23, 1946 Today's Date: 01/05/2018   History of Present Illness  Pt is a 72 y/o female s/p L4-5 decompression. PMH includes HTN, anxiety, and melanoma.   Clinical Impression  Patient is s/p above surgery resulting in the deficits listed below (see PT Problem List). Pt tolerated gait training well this session, however, slightly unsteady requiring min to min guard A. Educated about back precautions and walking program to perform at home. Patient will benefit from skilled PT to increase their independence and safety with mobility (while adhering to their precautions) to allow discharge to the venue listed below.     Follow Up Recommendations No PT follow up;Supervision for mobility/OOB    Equipment Recommendations  None recommended by PT    Recommendations for Other Services       Precautions / Restrictions Precautions Precautions: Back Precaution Booklet Issued: Yes (comment) Precaution Comments: Reviewed back precautions with pt.  Required Braces or Orthoses: Spinal Brace Spinal Brace: Lumbar corset;Applied in sitting position Restrictions Weight Bearing Restrictions: No      Mobility  Bed Mobility Overal bed mobility: Needs Assistance Bed Mobility: Rolling;Sidelying to Sit;Sit to Sidelying Rolling: Min assist Sidelying to sit: Min assist     Sit to sidelying: Min assist General bed mobility comments: Min A for rolling and for trunk elevation to come up to sitting. Min A for LE lift assist for return to sidelying. Verbal cues for use of log roll technique.  Transfers Overall transfer level: Needs assistance Equipment used: 1 person hand held assist Transfers: Sit to/from Stand Sit to Stand: Min assist         General transfer comment: Min A for lift assist and steadying. Increased time to perform secondary to pain.   Ambulation/Gait Ambulation/Gait assistance: Min guard;Min  assist Ambulation Distance (Feet): 300 Feet Assistive device: (IV pole ) Gait Pattern/deviations: Step-through pattern;Decreased stride length Gait velocity: Decreased  Gait velocity interpretation: Below normal speed for age/gender General Gait Details: Slow, guarded gait. Slightly unsteady requiring occasional min A. Educated about generalized walking program to perform at home.  Stairs            Wheelchair Mobility    Modified Rankin (Stroke Patients Only)       Balance Overall balance assessment: Needs assistance Sitting-balance support: No upper extremity supported;Feet supported Sitting balance-Leahy Scale: Good     Standing balance support: Single extremity supported;During functional activity;No upper extremity supported Standing balance-Leahy Scale: Fair Standing balance comment: Able to maintain static standing without UE support.                              Pertinent Vitals/Pain Pain Assessment: 0-10 Pain Score: 6  Pain Location: back  Pain Descriptors / Indicators: Aching;Operative site guarding Pain Intervention(s): Limited activity within patient's tolerance;Monitored during session;Repositioned    Home Living Family/patient expects to be discharged to:: Private residence Living Arrangements: Spouse/significant other Available Help at Discharge: Family Type of Home: House Home Access: Stairs to enter Entrance Stairs-Rails: Left Entrance Stairs-Number of Steps: 6 Home Layout: One level Home Equipment: Cane - quad;Shower seat - built in      Prior Function Level of Independence: Independent               Hand Dominance        Extremity/Trunk Assessment   Upper Extremity Assessment Upper Extremity Assessment: Defer to OT evaluation  Lower Extremity Assessment Lower Extremity Assessment: Generalized weakness    Cervical / Trunk Assessment Cervical / Trunk Assessment: Other exceptions Cervical / Trunk Exceptions:  s/p decompression   Communication   Communication: No difficulties  Cognition Arousal/Alertness: Awake/alert Behavior During Therapy: WFL for tasks assessed/performed Overall Cognitive Status: Within Functional Limits for tasks assessed                                        General Comments General comments (skin integrity, edema, etc.): Pt's daughter present during session.     Exercises     Assessment/Plan    PT Assessment Patient needs continued PT services  PT Problem List Decreased strength;Decreased balance;Decreased mobility;Decreased knowledge of precautions;Pain       PT Treatment Interventions DME instruction;Gait training;Stair training;Functional mobility training;Therapeutic activities;Therapeutic exercise;Balance training;Neuromuscular re-education;Patient/family education    PT Goals (Current goals can be found in the Care Plan section)  Acute Rehab PT Goals Patient Stated Goal: to go home  PT Goal Formulation: With patient Time For Goal Achievement: 01/19/18 Potential to Achieve Goals: Good    Frequency Min 5X/week   Barriers to discharge        Co-evaluation               AM-PAC PT "6 Clicks" Daily Activity  Outcome Measure Difficulty turning over in bed (including adjusting bedclothes, sheets and blankets)?: Unable Difficulty moving from lying on back to sitting on the side of the bed? : Unable Difficulty sitting down on and standing up from a chair with arms (e.g., wheelchair, bedside commode, etc,.)?: Unable Help needed moving to and from a bed to chair (including a wheelchair)?: A Little Help needed walking in hospital room?: A Little Help needed climbing 3-5 steps with a railing? : A Little 6 Click Score: 12    End of Session Equipment Utilized During Treatment: Gait belt;Back brace Activity Tolerance: Patient tolerated treatment well Patient left: in bed;with call bell/phone within reach;with family/visitor  present Nurse Communication: Mobility status PT Visit Diagnosis: Pain;Unsteadiness on feet (R26.81);Other abnormalities of gait and mobility (R26.89) Pain - part of body: (back )    Time: 5784-6962 PT Time Calculation (min) (ACUTE ONLY): 25 min   Charges:   PT Evaluation $PT Eval Low Complexity: 1 Low PT Treatments $Gait Training: 8-22 mins   PT G Codes:        Leighton Ruff, PT, DPT  Acute Rehabilitation Services  Pager: [phone]   Meredith Cole 01/05/2018, 6:39 PM

## 2018-01-05 NOTE — Transfer of Care (Signed)
Immediate Anesthesia Transfer of Care Note  Patient: Meredith Cole  Procedure(s) Performed: Decompression Lumbar 4-Lumbar 5  (N/A Back)  Patient Location: PACU  Anesthesia Type:General  Level of Consciousness: drowsy and patient cooperative  Airway & Oxygen Therapy: Patient Spontanous Breathing and Patient connected to face mask oxygen  Post-op Assessment: Report given to RN and Post -op Vital signs reviewed and stable  Post vital signs: Reviewed and stable  Last Vitals:  Vitals:   01/05/18 0817  BP: 127/72  Pulse: 78  Resp: 18  Temp: (!) 36.4 C  SpO2: 99%    Last Pain:  Vitals:   01/05/18 0817  TempSrc: Oral         Complications: No apparent anesthesia complications

## 2018-01-05 NOTE — Anesthesia Postprocedure Evaluation (Signed)
Anesthesia Post Note  Patient: Meredith Cole  Procedure(s) Performed: Decompression Lumbar 4-Lumbar 5  (N/A Back)     Patient location during evaluation: PACU Anesthesia Type: General Level of consciousness: sedated and patient cooperative Pain management: pain level controlled Vital Signs Assessment: post-procedure vital signs reviewed and stable Respiratory status: spontaneous breathing Cardiovascular status: stable Anesthetic complications: no    Last Vitals:  Vitals:   01/05/18 1500 01/05/18 1528  BP:  (!) 96/45  Pulse: 72 76  Resp: 18 16  Temp: (!) 36.3 C 36.4 C  SpO2: 97% 98%    Last Pain:  Vitals:   01/05/18 1630  TempSrc:   PainSc: [place]

## 2018-01-05 NOTE — Plan of Care (Signed)
  Progressing Education: Knowledge of General Education information will improve 01/05/2018 2206 - Progressing by Margot Chimes D, RN Health Behavior/Discharge Planning: Ability to manage health-related needs will improve 01/05/2018 2206 - Progressing by Charlena Cross, RN Clinical Measurements: Ability to maintain clinical measurements within normal limits will improve 01/05/2018 2206 - Progressing by Charlena Cross, RN Will remain free from infection 01/05/2018 2206 - Progressing by Margot Chimes D, RN Diagnostic test results will improve 01/05/2018 2206 - Progressing by Margot Chimes D, RN Respiratory complications will improve 01/05/2018 2206 - Progressing by Charlena Cross, RN Cardiovascular complication will be avoided 01/05/2018 2206 - Progressing by Margot Chimes D, RN Activity: Risk for activity intolerance will decrease 01/05/2018 2206 - Progressing by Margot Chimes D, RN Nutrition: Adequate nutrition will be maintained 01/05/2018 2206 - Progressing by Margot Chimes D, RN Coping: Level of anxiety will decrease 01/05/2018 2206 - Progressing by Charlena Cross, RN Elimination: Will not experience complications related to bowel motility 01/05/2018 2206 - Progressing by Charlena Cross, RN Will not experience complications related to urinary retention 01/05/2018 2206 - Progressing by Margot Chimes D, RN Pain Managment: General experience of comfort will improve 01/05/2018 2206 - Progressing by Margot Chimes D, RN Safety: Ability to remain free from injury will improve 01/05/2018 2206 - Progressing by Pricilla Holm, Armanii Pressnell D, RN Skin Integrity: Risk for impaired skin integrity will decrease 01/05/2018 2206 - Progressing by Charlena Cross, RN Activity: Ability to avoid complications of mobility impairment will improve 01/05/2018 2206 - Progressing by Pricilla Holm, Hezakiah Champeau D, RN Ability to tolerate increased activity will improve 01/05/2018 2206 - Progressing by Charlena Cross, RN Will remain  free from falls 01/05/2018 2206 - Progressing by Margot Chimes D, RN Bowel/Gastric: Gastrointestinal status for postoperative course will improve 01/05/2018 2206 - Progressing by Charlena Cross, RN Education: Ability to verbalize activity precautions or restrictions will improve 01/05/2018 2206 - Progressing by Margot Chimes D, RN Knowledge of the prescribed therapeutic regimen will improve 01/05/2018 2206 - Progressing by Charlena Cross, RN Understanding of discharge needs will improve 01/05/2018 2206 - Progressing by Charlena Cross, RN Physical Regulation: Ability to maintain clinical measurements within normal limits will improve 01/05/2018 2206 - Progressing by Pricilla Holm, Jalan Bodi D, RN Postoperative complications will be avoided or minimized 01/05/2018 2206 - Progressing by Margot Chimes D, RN Diagnostic test results will improve 01/05/2018 2206 - Progressing by Margot Chimes D, RN Pain Management: Pain level will decrease 01/05/2018 2206 - Progressing by Charlena Cross, RN Skin Integrity: Signs of wound healing will improve 01/05/2018 2206 - Progressing by Margot Chimes D, RN Health Behavior/Discharge Planning: Identification of resources available to assist in meeting health care needs will improve 01/05/2018 2206 - Progressing by Margot Chimes D, RN Bladder/Genitourinary: Urinary functional status for postoperative course will improve 01/05/2018 2206 - Progressing by Charlena Cross, RN

## 2018-01-05 NOTE — Anesthesia Procedure Notes (Signed)
Procedure Name: Intubation Date/Time: 01/05/2018 10:06 AM Performed by: Freddie Breech, CRNA Pre-anesthesia Checklist: Patient identified, Emergency Drugs available, Suction available and Patient being monitored Patient Re-evaluated:Patient Re-evaluated prior to induction Oxygen Delivery Method: Circle System Utilized Preoxygenation: Pre-oxygenation with 100% oxygen Induction Type: IV induction Ventilation: Mask ventilation without difficulty Laryngoscope Size: Mac and 3 Grade View: Grade III Tube type: Oral Tube size: 7.0 mm Number of attempts: 1 Airway Equipment and Method: Stylet and Oral airway Placement Confirmation: ETT inserted through vocal cords under direct vision,  positive ETCO2 and breath sounds checked- equal and bilateral Secured at: 21 cm Tube secured with: Tape Dental Injury: Teeth and Oropharynx as per pre-operative assessment

## 2018-01-05 NOTE — Discharge Instructions (Signed)
Lumbar Diskectomy, Care After °Refer to this sheet in the next few weeks. These instructions provide you with information about caring for yourself after your procedure. Your health care provider may also give you more specific instructions. Your treatment has been planned according to current medical practices, but problems sometimes occur. Call your health care provider if you have any problems or questions after your procedure. °What can I expect after the procedure? °After the procedure, it is common to have: °· Pain. °· Numbness. °· Weakness. ° °Follow these instructions at home: °Medicines °· Take medicines only as directed by your health care provider. °· If you were prescribed an antibiotic medicine, finish all of it even if you start to feel better. °Incision care °· There are many different ways to close and cover an incision, including stitches (sutures), skin glue, and adhesive strips. Follow your health care provider's instructions about: °? Incision care. °? Bandage (dressing) changes and removal. °? Incision closure removal. °· Check your incision area every day for signs of infection. If you cannot see your incision, have someone check it for you. Watch for: °? Redness, swelling, or pain. °? Fluid, blood, or pus. °Activity °· Avoid sitting for longer than 20 minutes at a time or as directed by your health care provider. °· Do not climb stairs more than once each day until your health care provider approves. °· Do not bend at your waist. To pick things up, bend your knees. °· Do not lift anything that is heavier than 10 lb (4.5 kg) or as directed by your health care provider. °· Do not drive a car until your health care provider approves. °· Ask your health care provider when you may return to your normal activities, such as playing sports and going back to work. °· Work with your physical therapist to learn safe movement and exercises to help healing. Do these exercises as directed. °· Take short  walks often. °General instructions °· Do not use any tobacco products, including cigarettes, chewing tobacco, or electronic cigarettes. If you need help quitting, ask your health care provider. °· Follow your health care provider’s instructions about bathing. Do not take baths, shower, swim, or use a hot tub until your health care provider approves. °· Wear your back brace as directed by your health care provider. °· To prevent constipation: °? Drink enough fluid to keep your urine clear or pale yellow. °? Eat plenty of fruits, vegetables, and whole grains. °· Keep all follow-up visits as directed by your health care provider. This is important. This includes any follow-up visits with your physical therapist. °Contact a health care provider if: °· You have a fever. °· You have redness, swelling, or pain in your incision area. °· Your pain is not controlled with medicine. °· You have pain, numbness, or weakness that lasts longer than three weeks after surgery. °· You become constipated. °Get help right away if: °· You have fluid, blood, or pus coming from your incision. °· You have increasing pain, numbness, or weakness. °· You lose control of when you urinate or have a bowel movement (incontinence). °· You have chest pain. °· You have trouble breathing. °This information is not intended to replace advice given to you by your health care provider. Make sure you discuss any questions you have with your health care provider. °Document Released: 10/27/2004 Document Revised: 04/29/2016 Document Reviewed: 07/17/2014 °Elsevier Interactive Patient Education © 2018 [employer] ° °

## 2018-01-06 ENCOUNTER — Encounter (HOSPITAL_COMMUNITY): Payer: Self-pay | Admitting: Orthopedic Surgery

## 2018-01-06 DIAGNOSIS — M48062 Spinal stenosis, lumbar region with neurogenic claudication: Secondary | ICD-10-CM | POA: Diagnosis not present

## 2018-01-06 NOTE — Evaluation (Signed)
Occupational Therapy Evaluation Patient Details Name: Meredith Cole MRN: [id number] DOB: 12-25-1945 Today's Date: 01/06/2018    History of Present Illness Pt is a 72 y/o female s/p L4-5 decompression. PMH includes HTN, anxiety, and melanoma.    Clinical Impression   Patient evaluated by Occupational Therapy with no further acute OT needs identified. All education has been completed and the patient has no further questions. See below for any follow-up Occupational Therapy or equipment needs. OT to sign off. Thank you for referral.   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 Pt taking notes in [place] during session. Pt educated on use of Iphone to help with medication management and change of positions.     Follow Up Recommendations  No OT follow up    Equipment Recommendations  None recommended by OT    Recommendations for Other Services       Precautions / Restrictions Precautions Precautions: Back Precaution Booklet Issued: Yes (comment) Precaution Comments: reviewed back precautions with adls Required Braces or Orthoses: Spinal Brace Spinal Brace: Lumbar corset;Applied in sitting position Restrictions Weight Bearing Restrictions: No      Mobility Bed Mobility               General bed mobility comments: in chair ona rrival see PT notes on bed level education. pt reports no further questions.   Transfers Overall transfer level: Needs assistance Equipment used: 1 person hand held assist Transfers: Sit to/from Stand Sit to Stand: Min guard         General transfer comment: educated on scoot out to the edge of the chair without pushing upward keeping hip and shoulder together. once body mass to the edoge of hte chair then power up into standing. pt reports less pulling and increase comfort.     Balance Overall balance assessment:  Needs assistance Sitting-balance support: No upper extremity supported;Feet supported Sitting balance-Leahy Scale: Good       Standing balance-Leahy Scale: Good                             ADL either performed or assessed with clinical judgement   ADL Overall ADL's : Modified independent                                       General ADL Comments: pt dressed upon arrival. educated on proper dressing sequence and pt states "oh i wish you were here a little sooner but thats okay i know now. I am so glad you came to see me!" pt demonstrates shower transfer and answered questions regarding built in seat vs purchase of additional seat.   Back handout provided and reviewed adls in detail. Pt educated on: clothing between brace, never sleep in brace, set an alarm at night for medication, avoid sitting for long periods of time, correct bed positioning for sleeping, avoiding lifting more than 5 pounds and never wash directly over incision. All education is complete and patient indicates understanding.     Vision Baseline Vision/History: No visual deficits       Perception     Praxis      Pertinent Vitals/Pain Pain Assessment: 0-10 Pain Score: 4  Pain Location: back  Pain Descriptors /  Indicators: Aching;Operative site guarding Pain Intervention(s): Monitored during session;Premedicated before session;Repositioned     Hand Dominance Right   Extremity/Trunk Assessment Upper Extremity Assessment Upper Extremity Assessment: Overall WFL for tasks assessed   Lower Extremity Assessment Lower Extremity Assessment: Defer to PT evaluation   Cervical / Trunk Assessment Cervical / Trunk Assessment: Other exceptions Cervical / Trunk Exceptions: s/p decompression    Communication Communication Communication: No difficulties   Cognition Arousal/Alertness: Awake/alert Behavior During Therapy: WFL for tasks assessed/performed Overall Cognitive Status: Within  Functional Limits for tasks assessed                                     General Comments  educated RN will cover the wound care for follow up . 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       Prior Functioning/Environment Level of Independence: Independent                 OT Problem List: Decreased activity tolerance;Decreased knowledge of precautions;Decreased knowledge of use of DME or AE;Decreased safety awareness;Pain      OT Treatment/Interventions:      OT Goals(Current goals can be found in the care plan section) Acute Rehab OT Goals Patient Stated Goal: to go home   OT Frequency:     Barriers to D/C:            Co-evaluation              AM-PAC PT "6 Clicks" Daily Activity     Outcome Measure Help from another person eating meals?: None Help from another person taking care of personal grooming?: None Help from another person toileting, which includes using toliet, bedpan, or urinal?: None Help from another person bathing (including washing, rinsing, drying)?: None Help from another person to put on and taking off regular upper body clothing?: None Help from another person to put on and taking off regular lower body clothing?: None 6 Click Score: 24   End of Session Equipment Utilized During Treatment: Back brace Nurse Communication: Mobility status;Precautions  Activity  Tolerance: Patient tolerated treatment well Patient left: in chair;with call bell/phone within reach                   Time: 4967-5916 OT Time Calculation (min): 25 min Charges:  OT Evaluation $OT Eval Moderate Complexity: 1 Mod G-Codes:      Jeri Modena   OTR/L Pager: [phone] Office: [phone] .   Parke Poisson B 01/06/2018, 10:46 AM

## 2018-01-06 NOTE — Progress Notes (Signed)
    Subjective: Procedure(s) (LRB): Decompression Lumbar 4-Lumbar 5  (N/A) 1 Day Post-Op  Patient reports pain as 0 on 0-10 scale.  Reports none leg pain denies incisional back pain   Positive void Negative bowel movement Positive flatus Negative chest pain or shortness of breath  Objective: Vital signs in last 24 hours: Temp:  [97.2 F (36.2 C)-97.7 F (36.5 C)] 97.7 F (36.5 C) (02/01 0310) Pulse Rate:  [64-92] 64 (02/01 0310) Resp:  [9-19] 18 (02/01 0310) BP: (96-147)/(45-90) 102/67 (02/01 0310) SpO2:  [95 %-100 %] 95 % (02/01 0310) Weight:  [86.8 kg (191 lb 4.8 oz)] 86.8 kg (191 lb 4.8 oz) (01/31 0844)  Intake/Output from previous day: 01/31 0701 - 02/01 0700 In: 1400 [I.V.:1300; IV Piggyback:100] Out: 100 [Blood:100]  Labs: No results for input(s): WBC, RBC, HCT, PLT in the last 72 hours. No results for input(s): NA, K, CL, CO2, BUN, CREATININE, GLUCOSE, CALCIUM in the last 72 hours. No results for input(s): LABPT, INR in the last 72 hours.  Physical Exam: Neurologically intact ABD soft Intact pulses distally Dorsiflexion/Plantar flexion intact Incision: dressing C/D/I Compartment soft Body mass index is 31.35 kg/m.   Assessment/Plan: Patient stable  xrays n/a Continue mobilization with physical therapy Continue care  Advance diet Up with therapy  Doing well - plan on d/c to home today  Melina Schools, MD [place] [phone]

## 2018-01-06 NOTE — Progress Notes (Signed)
Physical Therapy Treatment Patient Details Name: Meredith Cole MRN: [id number] DOB: 24-Sep-1946 Today's Date: 01/06/2018    History of Present Illness Pt is a 72 y/o female s/p L4-5 decompression. PMH includes HTN, anxiety, and melanoma.     PT Comments    Pt making good progress with functional mobility. PT will continue to follow acutely to ensure a safe d/c home.   Follow Up Recommendations  No PT follow up;Supervision for mobility/OOB     Equipment Recommendations  None recommended by PT    Recommendations for Other Services       Precautions / Restrictions Precautions Precautions: Back Precaution Booklet Issued: Yes (comment) Precaution Comments: reviewed back precautions with pt throughout Required Braces or Orthoses: Spinal Brace Spinal Brace: Lumbar corset;Applied in sitting position Restrictions Weight Bearing Restrictions: No    Mobility  Bed Mobility               General bed mobility comments: pt standing and exiting bathroom in her room upon arrival  Transfers Overall transfer level: Modified independent Equipment used: 1 person hand held assist Transfers: Sit to/from Stand Sit to Stand: Min guard         General transfer comment: educated on scoot out to the edge of the chair without pushing upward keeping hip and shoulder together. once body mass to the edoge of hte chair then power up into standing. pt reports less pulling and increase comfort.   Ambulation/Gait Ambulation/Gait assistance: Supervision Ambulation Distance (Feet): 500 Feet Assistive device: None Gait Pattern/deviations: Step-through pattern;Decreased stride length Gait velocity: Decreased  Gait velocity interpretation: Below normal speed for age/gender General Gait Details: no instability or LOB without use of an AD, supervision for safety   Stairs Stairs: Yes   Stair Management: One rail Left;Alternating pattern;Step to pattern;Forwards Number of Stairs: 10 General  stair comments: no instability or LOB, supervision for safety  Wheelchair Mobility    Modified Rankin (Stroke Patients Only)       Balance Overall balance assessment: Needs assistance Sitting-balance support: No upper extremity supported;Feet supported Sitting balance-Leahy Scale: Good     Standing balance support: During functional activity;No upper extremity supported Standing balance-Leahy Scale: Good                              Cognition Arousal/Alertness: Awake/alert Behavior During Therapy: WFL for tasks assessed/performed Overall Cognitive Status: Within Functional Limits for tasks assessed                                        Exercises      General Comments General comments (skin integrity, edema, etc.): educated RN will cover the wound care for follow up . 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     Prior Function Level of Independence: Independent  PT Goals (current goals can now be found in the care plan section) Acute Rehab PT Goals Patient Stated Goal: to go home  PT Goal Formulation: With patient Time For Goal Achievement: 01/19/18 Potential to Achieve Goals: Good Progress towards PT goals: Progressing toward goals    Frequency    Min 5X/week      PT Plan Current plan remains appropriate     Co-evaluation              AM-PAC PT "6 Clicks" Daily Activity  Outcome Measure  Difficulty turning over in bed (including adjusting bedclothes, sheets and blankets)?: A Little Difficulty moving from lying on back to sitting on the side of the bed? : A Little Difficulty sitting down on and standing up from a chair with arms (e.g., wheelchair, bedside commode, etc,.)?: None Help needed moving to and from a bed to chair (including a wheelchair)?: None Help needed walking in hospital room?: None Help needed climbing 3-5 steps with a railing? : A Little 6 Click Score: 21    End of Session Equipment Utilized During Treatment: Back brace Activity Tolerance: Patient tolerated treatment well Patient left: with call bell/phone within reach;in chair Nurse Communication: Mobility status PT Visit Diagnosis: Pain;Unsteadiness on feet (R26.81);Other abnormalities of gait and mobility (R26.89) Pain - part of body: (back)     Time: 0277-4128 PT Time Calculation (min) (ACUTE ONLY): 12 min  Charges:  $Gait Training: 8-22 mins                    G Codes:       [place], [place], [place] 01/06/2018, 11:03 AM

## 2018-01-06 NOTE — Progress Notes (Signed)
Patient is discharged from room 3C02 at this time. Alert and in stable condition. IV site d/c'd and instructions read to patient and spouse with understanding verbalized. Left unit via wheelchair with all belongings at side 

## 2018-01-10 NOTE — Discharge Summary (Signed)
Physician Discharge Summary  Patient ID: Meredith Cole MRN: [id number] DOB/AGE: 07-May-1946 72 y.o.  Admit date: 01/05/2018 Discharge date: 01/06/18  Admission Diagnoses:  Lumbar spinal stenosis  Discharge Diagnoses:  Active Problems:   S/P lumbar spine operation   Past Medical History:  Diagnosis Date  . Allergy   . Anxiety   . Arthritis    back  . Cyst of right kidney   . Hyperlipidemia   . Hypertension   . Melanoma ([place]) 2016  . 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 Trichuriasis - whipworm 2009   Incidental at colonoscopy - treated with mebendazole  . Vertigo     Surgeries: Procedure(s): Decompression Lumbar 4-Lumbar 5  on 01/05/2018   Consultants (if any):   Discharged Condition: Improved  Hospital Course: Meredith Cole is an 72 y.o. female who was admitted 01/05/2018 with a diagnosis of Lumbar spinal stenosis and went to the operating room on 01/05/2018 and underwent the above named procedures.  Post op day one pt reports low level of pain controlled on oral medication.  Pt is ambulating in hallway. Pt is urinating w/o difficulty.  Pt is cleared by PT before DC.   She was given perioperative antibiotics:  Anti-infectives (From admission, onward)   Start     Dose/Rate Route Frequency Ordered Stop   01/05/18 1820  ceFAZolin (ANCEF) IVPB 2g/100 mL premix     2 g 200 mL/hr over 30 Minutes Intravenous Every 8 hours 01/05/18 1525 01/06/18 0240   01/05/18 0708  ceFAZolin (ANCEF) 2-4 GM/100ML-% IVPB    Comments:  Tamsen Snider   : cabinet override      01/05/18 0708 01/05/18 1914   01/05/18 0707  ceFAZolin (ANCEF) IVPB 2g/100 mL premix     2 g 200 mL/hr over 30 Minutes Intravenous 30 min pre-op 01/05/18 0707 01/05/18 1025    .  She was given sequential compression devices, early ambulation, and TED for DVT prophylaxis.  She benefited maximally from the hospital stay and there were no complications.    Recent vital signs:  Vitals:   01/06/18 0310 01/06/18 0810  BP: 102/67 131/71  Pulse: 64 87  Resp: 18 16  Temp: 97.7 F (36.5 C) 98.2 F (36.8 C)  SpO2: 95% 96%    Recent laboratory studies:  Lab Results  Component Value Date   HGB 14.5 12/30/2017   HGB 14.2 10/30/2016   HGB 11.0 (L) 06/28/2008   Lab Results  Component Value Date   WBC 6.7 12/30/2017   PLT 248 12/30/2017   No results found for: INR Lab Results  Component Value Date   NA 139 12/30/2017   K 3.2 (L) 12/30/2017   CL 103 12/30/2017   CO2 25 12/30/2017   BUN 19 12/30/2017   CREATININE 0.99 12/30/2017   GLUCOSE 100 (H) 12/30/2017    Discharge Medications:   Allergies as of 01/06/2018   No Known Allergies     Medication List    STOP taking these medications   acetaminophen 650 MG CR tablet Commonly known as:  TYLENOL   aspirin EC 81 MG tablet     TAKE these medications   ALPRAZolam 1 MG tablet Commonly known as:  XANAX Take 0.5 mg by mouth at bedtime.   atorvastatin 20 MG tablet Commonly known as:  LIPITOR Take 20 mg by mouth at bedtime.   buPROPion 150 MG 24 hr tablet Commonly known as:  WELLBUTRIN XL Take 150 mg by mouth daily.   COMBIGAN  0.2-0.5 % ophthalmic solution Generic drug:  brimonidine-timolol Place 1 drop into both eyes 2 (two) times daily.   DULoxetine 60 MG capsule Commonly known as:  CYMBALTA Take 60 mg by mouth daily.   fexofenadine 180 MG tablet Commonly known as:  ALLEGRA Take 180 mg by mouth daily.   fluticasone 50 MCG/ACT nasal spray Commonly known as:  FLONASE Place 1-2 sprays into both nostrils daily as needed for allergies.   hydrochlorothiazide 12.5 MG capsule Commonly known as:  MICROZIDE Take 12.5 mg by mouth daily.   latanoprost 0.005 % ophthalmic solution Commonly known as:  XALATAN Place 1 drop into both eyes at bedtime.   methocarbamol 500 MG tablet Commonly known as:  ROBAXIN Take 1 tablet (500 mg total) by mouth 3 (three) times daily.   multivitamin with minerals Tabs  tablet Take 1 tablet by mouth 2 (two) times a week.   ondansetron 4 MG disintegrating tablet Commonly known as:  ZOFRAN ODT Take 1 tablet (4 mg total) by mouth every 8 (eight) hours as needed for nausea or vomiting.   oxyCODONE-acetaminophen 10-325 MG tablet Commonly known as:  PERCOCET Take 1 tablet by mouth every 4 (four) hours as needed for up to 5 days for pain.   REFRESH OPTIVE 1-0.9 % Gel Generic drug:  Carboxymethylcellul-Glycerin Place 1 drop into both eyes 3 (three) times daily as needed (for dry eyes.).       Diagnostic Studies: Dg Lumbar Spine 2-3 Views  Result Date: 01/05/2018 CLINICAL DATA:  Localization for decompression of L4-5. EXAM: LUMBAR SPINE - 2-3 VIEW COMPARISON:  06/29/2012 MRI. FINDINGS: 2 intraoperative lateral views. The first, labeled 1, demonstrates surgical devices projecting posterior to the L5 vertebral body and the inferior aspect of the L3 vertebral body. This presumes the nomenclature from the MRI of 06/29/2012, with a diminutive S1-2 disc space and advanced degenerative disc disease at L5-S1. The second image, labeled 2, demonstrates a surgical device projecting posterior to the L4-5 level and superior aspect of the L5 vertebral body. IMPRESSION: Intraoperative localization of L4-5, as above. Findings called to the operating room at 11 a.m. Electronically Signed   By: Abigail Miyamoto M.D.   On: 01/05/2018 11:01    Disposition: 01-Home or Self Care Pt will present to clinic in 2 weeks Post op meds provided Discharge Instructions    Incentive spirometry RT   Complete by:  As directed       Follow-up Information    Melina Schools, MD Follow up in 2 week(s).   Specialty:  Orthopedic Surgery Contact information: [address] [place] [address] [phone]            Signed: Valinda Hoar 01/10/2018, 8:05 AM

## 2019-04-23 ENCOUNTER — Encounter (HOSPITAL_COMMUNITY): Payer: Self-pay | Admitting: Emergency Medicine

## 2019-04-23 ENCOUNTER — Other Ambulatory Visit: Payer: Self-pay

## 2019-04-23 ENCOUNTER — Emergency Department (HOSPITAL_COMMUNITY)
Admission: EM | Admit: 2019-04-23 | Discharge: 2019-04-23 | Disposition: A | Discharge status: Home / Self Care | Payer: Medicare Other | Attending: Emergency Medicine | Admitting: Emergency Medicine

## 2019-04-23 DIAGNOSIS — Z5321 Procedure and treatment not carried out due to patient leaving prior to being seen by health care provider: Secondary | ICD-10-CM | POA: Diagnosis not present

## 2019-04-23 DIAGNOSIS — Z043 Encounter for examination and observation following other accident: Secondary | ICD-10-CM | POA: Insufficient documentation

## 2019-04-23 NOTE — ED Triage Notes (Signed)
Pt had a fall after tripping of her shoe and fell forward hitting right side of face. Pt denies any LOC- called doctor and told her to come to ER to have possible xray done. Pt denies any blood thinners.

## 2019-04-23 NOTE — ED Notes (Signed)
Called patient to reassess vitals x3 and had no answer. 

## 2019-06-01 ENCOUNTER — Ambulatory Visit: Payer: Self-pay | Admitting: Orthopedic Surgery

## 2019-06-01 NOTE — H&P (Deleted)
  The note originally documented on this encounter has been moved the the encounter in which it belongs.  

## 2019-06-01 NOTE — H&P (Signed)
Subjective:   Meredith Cole is a pleasant 73 year old female with no significant PMH who has had ongoing neck and radicular left arm pain that has been constant, severe (7/10), debilitating, and effecting the patients quality of life for the past 3 months. She describes her pain as "gnawing," "electrical shocks," and "numbness and tingling" traveling across the right shoulder, down the flexor portion of her arm and into digits 1-3. Fortunately she denies any weakness. Her pain and quality of life have not improved with physical therapy, medrol dose pack, a steroid injection, OTC medications, nor prescription medications. At this time, she would like to move forward with surgical intervention.  Patient Active Problem List   Diagnosis Date Noted  . S/P lumbar spine operation 01/05/2018  . Abdominal pain, epigastric 11/08/2016  . Vomiting 11/08/2016  . Black stool 11/08/2016  . Palpitations 01/14/2016  . HYPERLIPIDEMIA 03/01/2008  . 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   Past Medical History:  Diagnosis Date  . Allergy   . Anxiety   . Arthritis    back  . Cyst of right kidney   . Hyperlipidemia   . Hypertension   . Melanoma ([place]) 2016  . 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 Trichuriasis - whipworm 2009   Incidental at colonoscopy - treated with mebendazole  . Vertigo     Past Surgical History:  Procedure Laterality Date  . ARM WOUND REPAIR / CLOSURE     WRIST  BROKE WAS REPAIRED  . bone plate and graft left upper arm    . CATARACT EXTRACTION W/ INTRAOCULAR LENS  IMPLANT, BILATERAL     GLAUCOMA  PROC  . CESAREAN SECTION    . CHOLECYSTECTOMY    . COLON SURGERY  01/2008   Dr. Zettie Pho; right hemi-colectomy for tubulovillous adenoma  . COLONOSCOPY  2002, 2008, 2009 and 06/28/2011   2002: 66mm polyp, 2008-2009: large ascending tubulovillous adenoma. 2012: diverticulosis, external hemorrhoids  . LUMBAR LAMINECTOMY/DECOMPRESSION MICRODISCECTOMY N/A  01/05/2018   Procedure: Decompression Lumbar 4-Lumbar 5 ;  Surgeon: Melina Schools, MD;  Location: [facility];  Service: Orthopedics;  Laterality: N/A;  120 mins  . MELANOMA EXCISION     face    Current Outpatient Medications  Medication Sig Dispense Refill Last Dose  . acetaminophen (TYLENOL) 500 MG tablet Take 1,000 mg by mouth 2 (two) times daily as needed (pain).     Marland Kitchen ALPRAZolam (XANAX) 1 MG tablet Take 1 mg by mouth at bedtime.    01/04/2018 at Unknown time  . atorvastatin (LIPITOR) 20 MG tablet Take 20 mg by mouth at bedtime.    01/05/2018 at 0600  . brimonidine-timolol (COMBIGAN) 0.2-0.5 % ophthalmic solution Place 1 drop into both eyes 2 (two) times daily.   01/05/2018 at Unknown time  . buPROPion (WELLBUTRIN XL) 150 MG 24 hr tablet Take 150 mg by mouth daily.   01/04/2018 at Unknown time  . cycloSPORINE (RESTASIS) 0.05 % ophthalmic emulsion Place 1 drop into both eyes 2 (two) times daily.     . DULoxetine (CYMBALTA) 60 MG capsule Take 60 mg by mouth daily.   01/04/2018 at Unknown time  . fexofenadine (ALLEGRA) 180 MG tablet Take 180 mg by mouth daily.     01/05/2018 at 0600  . fluticasone (FLONASE) 50 MCG/ACT nasal spray Place 1-2 sprays into both nostrils daily as needed for allergies.   Past Month at Unknown time  . hydrochlorothiazide (MICROZIDE) 12.5 MG capsule Take 12.5 mg by mouth daily.   01/05/2018  at 0600  . HYDROcodone-acetaminophen (NORCO/VICODIN) 5-325 MG tablet Take 1 tablet by mouth every 6 (six) hours as needed for moderate pain.     Marland Kitchen latanoprost (XALATAN) 0.005 % ophthalmic solution Place 1 drop into both eyes at bedtime.   01/04/2018 at Unknown time  . meclizine (ANTIVERT) 25 MG tablet Take 25 mg by mouth 3 (three) times daily as needed for dizziness.     . Multiple Vitamin (MULTIVITAMIN WITH MINERALS) TABS tablet Take 1 tablet by mouth 2 (two) times a week.   Past Month at Unknown time   No current facility-administered medications for this visit.    Allergies  Allergen  Reactions  . Other Other (See Comments)    Seasonal Allergies     Social History   Tobacco Use  . Smoking status: Former Smoker    Packs/day: 1.00    Years: 3.00    Pack years: 3.00    Types: Cigarettes    Quit date: 09/27/1972    Years since quitting: 46.7  . Smokeless tobacco: Never Used  Substance Use Topics  . Alcohol use: Yes    Alcohol/week: 2.0 standard drinks    Types: 2 Glasses of wine per week    Family History  Problem Relation Age of Onset  . Colon cancer Mother 76       died at age 51  . Heart disease Father   . Esophageal cancer Neg Hx   . Pancreatic cancer Neg Hx   . Rectal cancer Neg Hx   . Stomach cancer Neg Hx     Review of Systems As stated in HPI  Objective:   General: AAOX3, well developed and well nourished, NAD  Ambulation: normal gait pattern, uses no assistive device.  Inspection: Aniscoria with left pupil being notable larger than right   Heart: RRR, no rubs, murmers, or gallops  Lungs: CTAB  Abdome: Normal BSX4, non-tender, non-distended, no hepatosplenomegaly.  Palpation: Tender over spinous processes and neck musculature.   AROM: -Shoulder, elbow, and wrists AROM normal and pain free.   Dermatomes: UE dermatomes abnormal to light touch bilaterally. Positive dysethesias in the C7 dermatome pattern   Myotomes:  - shoulder shrug: Left 5/5, Right 5/5 -Shoulder Abduction: Left 5/5, Right 5/5 - Elbow flexion: Left 5/5, Right 5/5 - Elbow extension Left 5/5, Right 5/5 - Finger abduction: Left 5/5, Right 5/5 - finger Adduction/squeeze: Left 5/5, Right 5/5  Reflexes:  - Biceps: Left2+, Right 2+ - Brachioradialius: Left2+, Right 2+ - Triceps: Left 2+, Right 2+ - Hoffman's: Negative  Special Tests: - UE Neural tension test: Left Positive, Right Negative  PV: Extremities warm and well profused. Distal pulses 2+ bilaterally.   X-Ray impression: Loss of lordotic curvature. DDD at multiple levels with some bone spurring. No signs  of fracture.   MRI Impression: MRI from 04/19/19 demonstrates foraminal stenosis at C6-7 on the left side causing irritation to the left C7 nerve root. She also has mild right-sided disease at C5-6. Clinically however the patient denies any right upper extremity symptoms. All of her pain is in the left C7 dermatome.  Assessment:   Assessment: Brelynn is a pleasant 73 year old female with no significant PMH who has had ongoing neck and radicular left arm pain that has been constant, severe (7/10), debilitating, and effecting the patients quality of life for the past 3 months. She has no motor deficits nor signs of myelopathy on exam. Patient describes pain in the C7 dermatome pattern, MRI demonstrates foraminal stenosis  at C6-7 on the left side causing irritation to the left C7 nerve root. At this point, she has failed to improve with conservative treatment measures and would like to move forward with a C6/7 ACDF.  Addendum dictation by Dr. Marnee Guarneri continues to have severe debilitating neck and neuropathic left arm pain. She describes the pain into the left tricep, extensor surface of the forearm and into the middle, index fingers and occasionally thumb. She has had a recent left C7 selective nerve root block but unfortunately this did not provide any significant relief.  MRI from 04/19/19 demonstrates foraminal stenosis at C6-7 on the left side causing irritation to the left C7 nerve root. She also has mild right-sided disease at C5-6. Clinically however the patient denies any right upper extremity symptoms. All of her pain is in the left C7 dermatome.  Plan:    At this point despite attempted formal physical therapy, injection therapy, as well as narcotic medication use her quality-of-life continues to deteriorate. Her main problem is the neuropathic arm pain. At this point time she has expressed interest in moving forward with surgery. At this point since she only has left radicular arm  pain I have recommended just addressing the symptomatic C6-7 level. I believe the procedure of choice would be a single level ACDF. I have gone over the risks and benefits of surgery and I have gone over the surgical procedure itself and the patient is expressed an understanding.  Risks and benefits of surgery were discussed with the patient. These include: Infection, bleeding, death, stroke, paralysis, ongoing or worse pain, need for additional surgery, nonunion, leak of spinal fluid, adjacent segment degeneration requiring additional fusion surgery. Pseudoarthrosis (nonunion)requiring supplemental posterior fixation. Throat pain, swallowing difficulties, hoarseness or change in voice.   With respect to disc replacement: Additional risks include heterotopic ossification, inability to place the disc due to technical issues requiring bailout to a fusion procedure.  Plan on moving forward with surgery in the near future once we get preoperative medical clearance from her primary care physician.

## 2019-06-05 NOTE — Progress Notes (Signed)
[facility] [place], [address] AT [address] [place] [address] Phone: [phone] Fax: [phone]      Your procedure is scheduled on July 8  Report to [facility] Main Entrance "A" at 1100 A.M., and check in at the Admitting office.  Call this number if you have problems the morning of surgery:  [phone]  Call [phone] if you have any questions prior to your surgery date Monday-Friday 8am-4pm    Remember:  Do not eat or drink after midnight.    Take these medicines the morning of surgery with A SIP OF WATER  acetaminophen (TYLENOL) if needed atorvastatin (LIPITOR) Eye drops if needed buPROPion (WELLBUTRIN XL)  DULoxetine (CYMBALTA)  fexofenadine (ALLEGRA)  fluticasone (FLONASE)  HYDROcodone-acetaminophen (NORCO/VICODIN if needed for pain meclizine (ANTIVERT)  If needed  7 days prior to surgery STOP taking any Aspirin (unless otherwise instructed by your surgeon), Aleve, Naproxen, Ibuprofen, Motrin, Advil, Goody's, BC's, all herbal medications, fish oil, and all vitamins.    The Morning of Surgery  Do not wear jewelry, make-up or nail polish.  Do not wear lotions, powders, or perfumes/colognes, or deodorant  Do not shave 48 hours prior to surgery.  Men may shave face and neck.  Do not bring valuables to the hospital.  [facility] is not responsible for any belongings or valuables.  If you are a smoker, DO NOT Smoke 24 hours prior to surgery IF you wear a CPAP at night please bring your mask, tubing, and machine the morning of surgery   Remember that you must have someone to transport you home after your surgery, and remain with you for 24 hours if you are discharged the same day.   Contacts, glasses, hearing aids, dentures or bridgework may not be worn into surgery.    Leave your suitcase in the car.  After surgery it may be brought to your room.  For patients admitted to  the hospital, discharge time will be determined by your treatment team.  Patients discharged the day of surgery will not be allowed to drive home.    Special instructions:   [facility]- Preparing For Surgery  Before surgery, you can play an important role. Because skin is not sterile, your skin needs to be as free of germs as possible. You can reduce the number of germs on your skin by washing with CHG (chlorahexidine gluconate) Soap before surgery.  CHG is an antiseptic cleaner which kills germs and bonds with the skin to continue killing germs even after washing.    Oral Hygiene is also important to reduce your risk of infection.  Remember - BRUSH YOUR TEETH THE MORNING OF SURGERY WITH YOUR REGULAR TOOTHPASTE  Please do not use if you have an allergy to CHG or antibacterial soaps. If your skin becomes reddened/irritated stop using the CHG.  Do not shave (including legs and underarms) for at least 48 hours prior to first CHG shower. It is OK to shave your face.  Please follow these instructions carefully.   1. Shower the NIGHT BEFORE SURGERY and the MORNING OF SURGERY with CHG Soap.   2. If you chose to wash your hair, wash your hair first as usual with your normal shampoo.  3. After you shampoo, rinse your hair and body thoroughly to remove the shampoo.  4. Use CHG as you would any other liquid soap. You can apply CHG directly to the skin and  wash gently with a scrungie or a clean washcloth.   5. Apply the CHG Soap to your body ONLY FROM THE NECK DOWN.  Do not use on open wounds or open sores. Avoid contact with your eyes, ears, mouth and genitals (private parts). Wash Face and genitals (private parts)  with your normal soap.   6. Wash thoroughly, paying special attention to the area where your surgery will be performed.  7. Thoroughly rinse your body with warm water from the neck down.  8. DO NOT shower/wash with your normal soap after using and rinsing off the CHG Soap.  9. Pat  yourself dry with a CLEAN TOWEL.  10. Wear CLEAN PAJAMAS to bed the night before surgery, wear comfortable clothes the morning of surgery  11. Place CLEAN SHEETS on your bed the night of your first shower and DO NOT SLEEP WITH PETS.    Day of Surgery:  Do not apply any deodorants/lotions.  Please wear clean clothes to the hospital/surgery center.   Remember to brush your teeth WITH YOUR REGULAR TOOTHPASTE.   Please read over the following fact sheets that you were given.

## 2019-06-06 ENCOUNTER — Encounter (HOSPITAL_COMMUNITY)
Admission: RE | Admit: 2019-06-06 | Discharge: 2019-06-06 | Disposition: A | Discharge status: Home / Self Care | Payer: Medicare Other | Source: Ambulatory Visit | Attending: Orthopedic Surgery | Admitting: Orthopedic Surgery

## 2019-06-06 ENCOUNTER — Other Ambulatory Visit: Payer: Self-pay

## 2019-06-06 ENCOUNTER — Ambulatory Visit (HOSPITAL_COMMUNITY)
Admission: RE | Admit: 2019-06-06 | Discharge: 2019-06-06 | Disposition: A | Discharge status: Home / Self Care | Payer: Medicare Other | Source: Ambulatory Visit | Attending: Orthopedic Surgery | Admitting: Orthopedic Surgery

## 2019-06-06 ENCOUNTER — Encounter (HOSPITAL_COMMUNITY): Payer: Self-pay

## 2019-06-06 DIAGNOSIS — Z01818 Encounter for other preprocedural examination: Secondary | ICD-10-CM

## 2019-06-06 DIAGNOSIS — I491 Atrial premature depolarization: Secondary | ICD-10-CM | POA: Insufficient documentation

## 2019-06-06 LAB — PROTIME-INR
INR: 1 (ref 0.8–1.2)
Prothrombin Time: 13.4 seconds (ref 11.4–15.2)

## 2019-06-06 LAB — URINALYSIS, ROUTINE W REFLEX MICROSCOPIC
Bacteria, UA: NONE SEEN
Bilirubin Urine: NEGATIVE
Glucose, UA: NEGATIVE mg/dL
Hgb urine dipstick: NEGATIVE
Ketones, ur: NEGATIVE mg/dL
Nitrite: NEGATIVE
Protein, ur: NEGATIVE mg/dL
Specific Gravity, Urine: 1.013 (ref 1.005–1.030)
pH: 6 (ref 5.0–8.0)

## 2019-06-06 LAB — BASIC METABOLIC PANEL
Anion gap: 8 (ref 5–15)
BUN: 16 mg/dL (ref 8–23)
CO2: 24 mmol/L (ref 22–32)
Calcium: 9 mg/dL (ref 8.9–10.3)
Chloride: 105 mmol/L (ref 98–111)
Creatinine, Ser: 1.1 mg/dL — ABNORMAL HIGH (ref 0.44–1.00)
GFR calc Af Amer: 57 mL/min — ABNORMAL LOW (ref >60)
GFR calc non Af Amer: 49 mL/min — ABNORMAL LOW (ref >60)
Glucose, Bld: 116 mg/dL — ABNORMAL HIGH (ref 70–99)
Potassium: 3.7 mmol/L (ref 3.5–5.1)
Sodium: 137 mmol/L (ref 135–145)

## 2019-06-06 LAB — SURGICAL PCR SCREEN
MRSA, PCR: NEGATIVE
Staphylococcus aureus: NEGATIVE

## 2019-06-06 LAB — CBC
HCT: 40 % (ref 36.0–46.0)
Hemoglobin: 13.3 g/dL (ref 12.0–15.0)
MCH: 33.1 pg (ref 26.0–34.0)
MCHC: 33.3 g/dL (ref 30.0–36.0)
MCV: 99.5 fL (ref 80.0–100.0)
Platelets: 302 10*3/uL (ref 150–400)
RBC: 4.02 MIL/uL (ref 3.87–5.11)
RDW: 12 % (ref 11.5–15.5)
WBC: 6.8 10*3/uL (ref 4.0–10.5)
nRBC: 0 % (ref 0.0–0.2)

## 2019-06-06 LAB — APTT: aPTT: 27 seconds (ref 24–36)

## 2019-06-06 NOTE — Progress Notes (Signed)
PCP - Dr. London Pepper Cardiologist - denies  Chest x-ray - 06/06/2019 EKG - 06/06/2019 Stress Test - 10+ years ago, normal per patient ECHO - denies Cardiac Cath - denies  Sleep Study - denies CPAP - denies  Blood Thinner Instructions: N/A Aspirin Instructions: N/A  Anesthesia review: No  Coronavirus Screening  Have you experienced the following symptoms:  Cough yes/no: No Fever (>100.5F)  yes/no: No Runny nose yes/no: No Sore throat yes/no: No Difficulty breathing/shortness of breath  yes/no: No  Have you or a family member traveled in the last 14 days and where? yes/no: No  If the patient indicates "YES" to the above questions, their PAT will be rescheduled to limit the exposure to others and, the surgeon will be notified. THE PATIENT WILL NEED TO BE ASYMPTOMATIC FOR 14 DAYS.   If the patient is not experiencing any of these symptoms, the PAT nurse will instruct them to NOT bring anyone with them to their appointment since they may have these symptoms or traveled as well.   Please remind your patients and families that hospital visitation restrictions are in effect and the importance of the restrictions.   Patient denies shortness of breath, fever, cough and chest pain at PAT appointment  Patient verbalized understanding of instructions that were given to them at the PAT appointment. Patient was also instructed that they will need to review over the PAT instructions again at home before surgery.

## 2019-06-06 NOTE — Progress Notes (Signed)
[facility] [place], [place] AT [address] [place] [address] Phone: [phone] Fax: [phone]    Your procedure is scheduled on July 8  Report to [facility] Main Entrance "A" at 1100 A.M., and check in at the Admitting office.  Call this number if you have problems the morning of surgery:  [phone]  Call [phone] if you have any questions prior to your surgery date Monday-Friday 8am-4pm   Remember:  Do not eat or drink after midnight.    Take these medicines the morning of surgery with A SIP OF WATER   brimonidine-timolol (COMBIGAN) -eye drops buPROPion (WELLBUTRIN XL)  cycloSPORINE (RESTASIS)  DULoxetine (CYMBALTA)  fexofenadine (ALLEGRA)   If needed - acetaminophen (TYLENOL) , fluticasone (FLONASE), HYDROcodone-acetaminophen (NORCO/VICODIN), meclizine (ANTIVERT)    7 days prior to surgery STOP taking any Aspirin (unless otherwise instructed by your surgeon), Aleve, Naproxen, Ibuprofen, Motrin, Advil, Goody's, BC's, all herbal medications, fish oil, and all vitamins.   The Morning of Surgery  Do not wear jewelry, make-up or nail polish.  Do not wear lotions, powders, or perfumes/colognes, or deodorant  Do not shave 48 hours prior to surgery.  Men may shave face and neck.  Do not bring valuables to the hospital.  [facility] is not responsible for any belongings or valuables.  If you are a smoker, DO NOT Smoke 24 hours prior to surgery IF you wear a CPAP at night please bring your mask, tubing, and machine the morning of surgery   Remember that you must have someone to transport you home after your surgery, and remain with you for 24 hours if you are discharged the same day.  Contacts, glasses, hearing aids, dentures or bridgework may not be worn into surgery.   Leave your suitcase in the car.  After surgery it may be brought to your room.  For patients admitted to the  hospital, discharge time will be determined by your treatment team.  Patients discharged the day of surgery will not be allowed to drive home.   Special instructions:   [facility]- Preparing For Surgery  Before surgery, you can play an important role. Because skin is not sterile, your skin needs to be as free of germs as possible. You can reduce the number of germs on your skin by washing with CHG (chlorahexidine gluconate) Soap before surgery.  CHG is an antiseptic cleaner which kills germs and bonds with the skin to continue killing germs even after washing.    Oral Hygiene is also important to reduce your risk of infection.  Remember - BRUSH YOUR TEETH THE MORNING OF SURGERY WITH YOUR REGULAR TOOTHPASTE  Please do not use if you have an allergy to CHG or antibacterial soaps. If your skin becomes reddened/irritated stop using the CHG.  Do not shave (including legs and underarms) for at least 48 hours prior to first CHG shower. It is OK to shave your face.  Please follow these instructions carefully.   1. Shower the NIGHT BEFORE SURGERY and the MORNING OF SURGERY with CHG Soap.   2. If you chose to wash your hair, wash your hair first as usual with your normal shampoo.  3. After you shampoo, rinse your hair and body thoroughly to remove the shampoo.  4. Use CHG as you would any other liquid soap. You can apply CHG directly to the skin and wash gently with a scrungie or a clean washcloth.  5. Apply the CHG Soap to your body ONLY FROM THE NECK DOWN.  Do not use on open wounds or open sores. Avoid contact with your eyes, ears, mouth and genitals (private parts). Wash Face and genitals (private parts)  with your normal soap.   6. Wash thoroughly, paying special attention to the area where your surgery will be performed.  7. Thoroughly rinse your body with warm water from the neck down.  8. DO NOT shower/wash with your normal soap after using and rinsing off the CHG Soap.  9. Pat  yourself dry with a CLEAN TOWEL.  10. Wear CLEAN PAJAMAS to bed the night before surgery, wear comfortable clothes the morning of surgery  11. Place CLEAN SHEETS on your bed the night of your first shower and DO NOT SLEEP WITH PETS.  Day of Surgery:  Do not apply any deodorants/lotions.  Please wear clean clothes to the hospital/surgery center.   Remember to brush your teeth WITH YOUR REGULAR TOOTHPASTE.  Please read over the following fact sheets that you were given.

## 2019-06-11 ENCOUNTER — Other Ambulatory Visit (HOSPITAL_COMMUNITY)
Admission: RE | Admit: 2019-06-11 | Discharge: 2019-06-11 | Disposition: A | Discharge status: Home / Self Care | Payer: Medicare Other | Source: Ambulatory Visit | Attending: Orthopedic Surgery | Admitting: Orthopedic Surgery

## 2019-06-11 DIAGNOSIS — Z01812 Encounter for preprocedural laboratory examination: Secondary | ICD-10-CM | POA: Insufficient documentation

## 2019-06-11 DIAGNOSIS — Z1159 Encounter for screening for other viral diseases: Secondary | ICD-10-CM | POA: Insufficient documentation

## 2019-06-12 LAB — SARS CORONAVIRUS 2 (TAT 6-24 HRS): SARS Coronavirus 2: NEGATIVE

## 2019-06-13 ENCOUNTER — Encounter (HOSPITAL_COMMUNITY): Payer: Self-pay

## 2019-06-13 ENCOUNTER — Observation Stay (HOSPITAL_COMMUNITY)
Admission: RE | Admit: 2019-06-13 | Discharge: 2019-06-13 | Disposition: A | Discharge status: Home / Self Care | Payer: Medicare Other | Attending: Orthopedic Surgery | Admitting: Orthopedic Surgery

## 2019-06-13 ENCOUNTER — Other Ambulatory Visit: Payer: Self-pay

## 2019-06-13 ENCOUNTER — Ambulatory Visit (HOSPITAL_COMMUNITY): Payer: Medicare Other

## 2019-06-13 ENCOUNTER — Ambulatory Visit (HOSPITAL_COMMUNITY): Payer: Medicare Other | Admitting: Anesthesiology

## 2019-06-13 ENCOUNTER — Encounter (HOSPITAL_COMMUNITY)
Admission: RE | Disposition: A | Discharge status: Home / Self Care | Payer: Self-pay | Source: Home / Self Care | Attending: Orthopedic Surgery

## 2019-06-13 DIAGNOSIS — I10 Essential (primary) hypertension: Secondary | ICD-10-CM | POA: Insufficient documentation

## 2019-06-13 DIAGNOSIS — M50123 Cervical disc disorder at C6-C7 level with radiculopathy: Secondary | ICD-10-CM | POA: Insufficient documentation

## 2019-06-13 DIAGNOSIS — E785 Hyperlipidemia, unspecified: Secondary | ICD-10-CM | POA: Insufficient documentation

## 2019-06-13 DIAGNOSIS — F419 Anxiety disorder, unspecified: Secondary | ICD-10-CM | POA: Diagnosis not present

## 2019-06-13 DIAGNOSIS — Z79899 Other long term (current) drug therapy: Secondary | ICD-10-CM | POA: Diagnosis not present

## 2019-06-13 DIAGNOSIS — M47812 Spondylosis without myelopathy or radiculopathy, cervical region: Secondary | ICD-10-CM | POA: Diagnosis not present

## 2019-06-13 DIAGNOSIS — M5020 Other cervical disc displacement, unspecified cervical region: Secondary | ICD-10-CM | POA: Diagnosis present

## 2019-06-13 DIAGNOSIS — Z419 Encounter for procedure for purposes other than remedying health state, unspecified: Secondary | ICD-10-CM

## 2019-06-13 DIAGNOSIS — Z87891 Personal history of nicotine dependence: Secondary | ICD-10-CM | POA: Diagnosis not present

## 2019-06-13 DIAGNOSIS — Z85820 Personal history of malignant melanoma of skin: Secondary | ICD-10-CM | POA: Diagnosis not present

## 2019-06-13 HISTORY — PX: ANTERIOR CERVICAL DECOMP/DISCECTOMY FUSION: SHX1161

## 2019-06-13 SURGERY — ANTERIOR CERVICAL DECOMPRESSION/DISCECTOMY FUSION 1 LEVEL
Anesthesia: General | Site: Spine Cervical

## 2019-06-13 MED ORDER — METHOCARBAMOL 1000 MG/10ML IJ SOLN
500.0000 mg | Freq: Four times a day (QID) | INTRAVENOUS | Status: DC | PRN
  Filled 2019-06-13: qty 5

## 2019-06-13 MED ORDER — LACTATED RINGERS IV SOLN: INTRAVENOUS | Status: DC

## 2019-06-13 MED ORDER — ONDANSETRON HCL 4 MG/2ML IJ SOLN
INTRAMUSCULAR | Status: AC
  Filled 2019-06-13: qty 2

## 2019-06-13 MED ORDER — CEFAZOLIN SODIUM-DEXTROSE 2-4 GM/100ML-% IV SOLN
2.0000 g | INTRAVENOUS | Status: AC
  Administered 2019-06-13: 14:00:00 2 g via INTRAVENOUS
  Filled 2019-06-13: qty 100

## 2019-06-13 MED ORDER — PHENYLEPHRINE 40 MCG/ML (10ML) SYRINGE FOR IV PUSH (FOR BLOOD PRESSURE SUPPORT)
PREFILLED_SYRINGE | INTRAVENOUS | Status: AC
  Filled 2019-06-13: qty 10

## 2019-06-13 MED ORDER — CEFAZOLIN SODIUM-DEXTROSE 1-4 GM/50ML-% IV SOLN
1.0000 g | Freq: Three times a day (TID) | INTRAVENOUS | Status: DC
  Administered 2019-06-13: 1 g via INTRAVENOUS
  Filled 2019-06-13: qty 50

## 2019-06-13 MED ORDER — PROPOFOL 10 MG/ML IV BOLUS
INTRAVENOUS | Status: DC | PRN
  Administered 2019-06-13: 80 mg via INTRAVENOUS

## 2019-06-13 MED ORDER — BUPIVACAINE-EPINEPHRINE 0.25% -1:200000 IJ SOLN
INTRAMUSCULAR | Status: DC | PRN
  Administered 2019-06-13: 8 mL

## 2019-06-13 MED ORDER — PHENYLEPHRINE 40 MCG/ML (10ML) SYRINGE FOR IV PUSH (FOR BLOOD PRESSURE SUPPORT)
PREFILLED_SYRINGE | INTRAVENOUS | Status: DC | PRN
  Administered 2019-06-13: 80 ug via INTRAVENOUS

## 2019-06-13 MED ORDER — SODIUM CHLORIDE 0.9% FLUSH: 3.0000 mL | Freq: Two times a day (BID) | INTRAVENOUS | Status: DC

## 2019-06-13 MED ORDER — OXYCODONE HCL 5 MG PO TABS: 5.0000 mg | ORAL_TABLET | ORAL | Status: DC | PRN

## 2019-06-13 MED ORDER — PREGABALIN 50 MG PO CAPS
50.0000 mg | ORAL_CAPSULE | Freq: Two times a day (BID) | ORAL | Status: DC
  Administered 2019-06-13: 20:00:00 50 mg via ORAL
  Filled 2019-06-13: qty 1

## 2019-06-13 MED ORDER — ONDANSETRON HCL 4 MG/2ML IJ SOLN: 4.0000 mg | Freq: Four times a day (QID) | INTRAMUSCULAR | Status: DC | PRN

## 2019-06-13 MED ORDER — ACETAMINOPHEN 500 MG PO TABS
1000.0000 mg | ORAL_TABLET | Freq: Once | ORAL | Status: AC
  Administered 2019-06-13: 12:00:00 1000 mg via ORAL
  Filled 2019-06-13: qty 2

## 2019-06-13 MED ORDER — MECLIZINE HCL 25 MG PO TABS
25.0000 mg | ORAL_TABLET | Freq: Three times a day (TID) | ORAL | Status: DC | PRN
  Filled 2019-06-13: qty 1

## 2019-06-13 MED ORDER — ROCURONIUM BROMIDE 10 MG/ML (PF) SYRINGE
PREFILLED_SYRINGE | INTRAVENOUS | Status: DC | PRN
  Administered 2019-06-13: 40 mg via INTRAVENOUS
  Administered 2019-06-13 (×2): 20 mg via INTRAVENOUS

## 2019-06-13 MED ORDER — SUCCINYLCHOLINE CHLORIDE 200 MG/10ML IV SOSY
PREFILLED_SYRINGE | INTRAVENOUS | Status: AC
  Filled 2019-06-13: qty 10

## 2019-06-13 MED ORDER — 0.9 % SODIUM CHLORIDE (POUR BTL) OPTIME
TOPICAL | Status: DC | PRN
  Administered 2019-06-13 (×2): 1000 mL

## 2019-06-13 MED ORDER — LACTATED RINGERS IV SOLN
INTRAVENOUS | Status: DC
  Administered 2019-06-13: 12:00:00 via INTRAVENOUS

## 2019-06-13 MED ORDER — DEXAMETHASONE SODIUM PHOSPHATE 10 MG/ML IJ SOLN
INTRAMUSCULAR | Status: DC | PRN
  Administered 2019-06-13: 10 mg via INTRAVENOUS

## 2019-06-13 MED ORDER — DULOXETINE HCL 30 MG PO CPEP: 60.0000 mg | ORAL_CAPSULE | Freq: Every day | ORAL | Status: DC

## 2019-06-13 MED ORDER — BUPIVACAINE-EPINEPHRINE (PF) 0.25% -1:200000 IJ SOLN
INTRAMUSCULAR | Status: AC
  Filled 2019-06-13: qty 30

## 2019-06-13 MED ORDER — OXYCODONE HCL 5 MG PO TABS: 10.0000 mg | ORAL_TABLET | ORAL | Status: DC | PRN

## 2019-06-13 MED ORDER — PROPOFOL 10 MG/ML IV BOLUS
INTRAVENOUS | Status: AC
  Filled 2019-06-13: qty 20

## 2019-06-13 MED ORDER — THROMBIN 20000 UNITS EX SOLR
CUTANEOUS | Status: DC | PRN
  Administered 2019-06-13: 20 mL

## 2019-06-13 MED ORDER — LIDOCAINE 2% (20 MG/ML) 5 ML SYRINGE
INTRAMUSCULAR | Status: AC
  Filled 2019-06-13: qty 5

## 2019-06-13 MED ORDER — BUPROPION HCL ER (XL) 150 MG PO TB24: 150.0000 mg | ORAL_TABLET | Freq: Every day | ORAL | Status: DC

## 2019-06-13 MED ORDER — ALPRAZOLAM 0.5 MG PO TABS: 1.0000 mg | ORAL_TABLET | Freq: Every day | ORAL | Status: DC

## 2019-06-13 MED ORDER — SODIUM CHLORIDE 0.9% FLUSH: 3.0000 mL | INTRAVENOUS | Status: DC | PRN

## 2019-06-13 MED ORDER — METHOCARBAMOL 500 MG PO TABS: 500.0000 mg | ORAL_TABLET | Freq: Three times a day (TID) | ORAL | 0 refills | Status: AC | PRN

## 2019-06-13 MED ORDER — MIDAZOLAM HCL 5 MG/5ML IJ SOLN
INTRAMUSCULAR | Status: DC | PRN
  Administered 2019-06-13: 2 mg via INTRAVENOUS

## 2019-06-13 MED ORDER — ONDANSETRON HCL 4 MG/2ML IJ SOLN
INTRAMUSCULAR | Status: DC | PRN
  Administered 2019-06-13: 4 mg via INTRAVENOUS

## 2019-06-13 MED ORDER — LIDOCAINE 2% (20 MG/ML) 5 ML SYRINGE
INTRAMUSCULAR | Status: DC | PRN
  Administered 2019-06-13: 80 mg via INTRAVENOUS

## 2019-06-13 MED ORDER — ONDANSETRON HCL 4 MG PO TABS: 4.0000 mg | ORAL_TABLET | Freq: Three times a day (TID) | ORAL | 0 refills | Status: DC | PRN

## 2019-06-13 MED ORDER — OXYCODONE-ACETAMINOPHEN 10-325 MG PO TABS: 1.0000 | ORAL_TABLET | Freq: Four times a day (QID) | ORAL | 0 refills | Status: AC | PRN

## 2019-06-13 MED ORDER — MENTHOL 3 MG MT LOZG: 1.0000 | LOZENGE | OROMUCOSAL | Status: DC | PRN

## 2019-06-13 MED ORDER — MIDAZOLAM HCL 2 MG/2ML IJ SOLN
INTRAMUSCULAR | Status: AC
  Filled 2019-06-13: qty 2

## 2019-06-13 MED ORDER — FLUTICASONE PROPIONATE 50 MCG/ACT NA SUSP
1.0000 | Freq: Every day | NASAL | Status: DC | PRN
  Filled 2019-06-13: qty 16

## 2019-06-13 MED ORDER — FENTANYL CITRATE (PF) 100 MCG/2ML IJ SOLN
INTRAMUSCULAR | Status: AC
  Filled 2019-06-13: qty 2

## 2019-06-13 MED ORDER — ONDANSETRON HCL 4 MG/2ML IJ SOLN: 4.0000 mg | Freq: Once | INTRAMUSCULAR | Status: DC | PRN

## 2019-06-13 MED ORDER — FENTANYL CITRATE (PF) 250 MCG/5ML IJ SOLN
INTRAMUSCULAR | Status: AC
  Filled 2019-06-13: qty 5

## 2019-06-13 MED ORDER — ACETAMINOPHEN 325 MG PO TABS: 650.0000 mg | ORAL_TABLET | ORAL | Status: DC | PRN

## 2019-06-13 MED ORDER — FENTANYL CITRATE (PF) 100 MCG/2ML IJ SOLN
INTRAMUSCULAR | Status: DC | PRN
  Administered 2019-06-13 (×4): 50 ug via INTRAVENOUS

## 2019-06-13 MED ORDER — ROCURONIUM BROMIDE 10 MG/ML (PF) SYRINGE
PREFILLED_SYRINGE | INTRAVENOUS | Status: AC
  Filled 2019-06-13: qty 10

## 2019-06-13 MED ORDER — PHENOL 1.4 % MT LIQD: 1.0000 | OROMUCOSAL | Status: DC | PRN

## 2019-06-13 MED ORDER — MORPHINE SULFATE (PF) 2 MG/ML IV SOLN: 1.0000 mg | INTRAVENOUS | Status: DC | PRN

## 2019-06-13 MED ORDER — METHOCARBAMOL 500 MG PO TABS: 500.0000 mg | ORAL_TABLET | Freq: Four times a day (QID) | ORAL | Status: DC | PRN

## 2019-06-13 MED ORDER — SODIUM CHLORIDE 0.9 % IV SOLN
INTRAVENOUS | Status: DC | PRN
  Administered 2019-06-13: 15:00:00 10 ug/min via INTRAVENOUS

## 2019-06-13 MED ORDER — FENTANYL CITRATE (PF) 100 MCG/2ML IJ SOLN
25.0000 ug | INTRAMUSCULAR | Status: DC | PRN
  Administered 2019-06-13: 25 ug via INTRAVENOUS

## 2019-06-13 MED ORDER — ONDANSETRON HCL 4 MG PO TABS: 4.0000 mg | ORAL_TABLET | Freq: Four times a day (QID) | ORAL | Status: DC | PRN

## 2019-06-13 MED ORDER — DEXAMETHASONE SODIUM PHOSPHATE 10 MG/ML IJ SOLN
INTRAMUSCULAR | Status: AC
  Filled 2019-06-13: qty 1

## 2019-06-13 MED ORDER — ACETAMINOPHEN 650 MG RE SUPP: 650.0000 mg | RECTAL | Status: DC | PRN

## 2019-06-13 MED ORDER — SUCCINYLCHOLINE CHLORIDE 20 MG/ML IJ SOLN
INTRAMUSCULAR | Status: DC | PRN
  Administered 2019-06-13: 100 mg via INTRAVENOUS

## 2019-06-13 SURGICAL SUPPLY — 64 items
BLADE CLIPPER SURG (BLADE) IMPLANT
BONE VIVIGEN FORMABLE 1.3CC (Bone Implant) ×3 IMPLANT
CABLE BIPOLOR RESECTION CORD (MISCELLANEOUS) ×3 IMPLANT
CANISTER SUCT 3000ML PPV (MISCELLANEOUS) ×3 IMPLANT
CLOSURE STERI-STRIP 1/2X4 (GAUZE/BANDAGES/DRESSINGS) ×1
CLSR STERI-STRIP ANTIMIC 1/2X4 (GAUZE/BANDAGES/DRESSINGS) ×2 IMPLANT
COVER MAYO STAND STRL (DRAPES) ×9 IMPLANT
COVER SURGICAL LIGHT HANDLE (MISCELLANEOUS) ×6 IMPLANT
COVER WAND RF STERILE (DRAPES) ×3 IMPLANT
DECANTER SPIKE VIAL GLASS SM (MISCELLANEOUS) ×2 IMPLANT
DEVICE ENDSKLTN IMPL 16X14X7X6 (Cage) IMPLANT
DRAPE C-ARM 42X72 X-RAY (DRAPES) ×3 IMPLANT
DRAPE POUCH INSTRU U-SHP 10X18 (DRAPES) ×3 IMPLANT
DRAPE SURG 17X23 STRL (DRAPES) ×3 IMPLANT
DRAPE U-SHAPE 47X51 STRL (DRAPES) ×3 IMPLANT
DRSG OPSITE POSTOP 3X4 (GAUZE/BANDAGES/DRESSINGS) ×3 IMPLANT
DRSG OPSITE POSTOP 4X6 (GAUZE/BANDAGES/DRESSINGS) ×2 IMPLANT
DURAPREP 26ML APPLICATOR (WOUND CARE) ×3 IMPLANT
ELECT COATED BLADE 2.86 ST (ELECTRODE) ×3 IMPLANT
ELECT PENCIL ROCKER SW 15FT (MISCELLANEOUS) ×3 IMPLANT
ELECT REM PT RETURN 9FT ADLT (ELECTROSURGICAL) ×3
ELECTRODE REM PT RTRN 9FT ADLT (ELECTROSURGICAL) ×1 IMPLANT
ENDOSKELETON IMPLANT 16X14X7X6 (Cage) ×3 IMPLANT
GLOVE BIO SURGEON STRL SZ 6.5 (GLOVE) ×2 IMPLANT
GLOVE BIO SURGEONS STRL SZ 6.5 (GLOVE) ×1
GLOVE BIOGEL PI IND STRL 6.5 (GLOVE) ×1 IMPLANT
GLOVE BIOGEL PI IND STRL 8.5 (GLOVE) ×1 IMPLANT
GLOVE BIOGEL PI INDICATOR 6.5 (GLOVE) ×2
GLOVE BIOGEL PI INDICATOR 8.5 (GLOVE) ×2
GLOVE SS BIOGEL STRL SZ 8.5 (GLOVE) ×1 IMPLANT
GLOVE SUPERSENSE BIOGEL SZ 8.5 (GLOVE) ×2
GOWN STRL REUS W/ TWL LRG LVL3 (GOWN DISPOSABLE) ×1 IMPLANT
GOWN STRL REUS W/TWL 2XL LVL3 (GOWN DISPOSABLE) ×3 IMPLANT
GOWN STRL REUS W/TWL LRG LVL3 (GOWN DISPOSABLE) ×2
GRAFT BNE MATRIX VG FRMBL SM 1 (Bone Implant) IMPLANT
KIT BASIN OR (CUSTOM PROCEDURE TRAY) ×3 IMPLANT
KIT TURNOVER KIT B (KITS) ×3 IMPLANT
NDL SPNL 18GX3.5 QUINCKE PK (NEEDLE) ×1 IMPLANT
NEEDLE HYPO 22GX1.5 SAFETY (NEEDLE) ×3 IMPLANT
NEEDLE SPNL 18GX3.5 QUINCKE PK (NEEDLE) ×3 IMPLANT
NS IRRIG 1000ML POUR BTL (IV SOLUTION) ×3 IMPLANT
PACK ORTHO CERVICAL (CUSTOM PROCEDURE TRAY) ×3 IMPLANT
PACK UNIVERSAL I (CUSTOM PROCEDURE TRAY) ×3 IMPLANT
PAD ARMBOARD 7.5X6 YLW CONV (MISCELLANEOUS) ×6 IMPLANT
PATTIES SURGICAL .25X.25 (GAUZE/BANDAGES/DRESSINGS) IMPLANT
PIN DISTRACTION 14 (PIN) ×4 IMPLANT
PLATE SKYLINE 12MM (Plate) ×2 IMPLANT
POSITIONER HEAD DONUT 9IN (MISCELLANEOUS) ×3 IMPLANT
RESTRAINT LIMB HOLDER UNIV (RESTRAINTS) ×3 IMPLANT
SCREW SELF DRILL SKYLINE 12MM (Screw) ×8 IMPLANT
SPONGE INTESTINAL PEANUT (DISPOSABLE) ×3 IMPLANT
SPONGE SURGIFOAM ABS GEL 100 (HEMOSTASIS) ×2 IMPLANT
SPONGE SURGIFOAM ABS GEL SZ50 (HEMOSTASIS) ×3 IMPLANT
SUT BONE WAX W31G (SUTURE) ×3 IMPLANT
SUT MON AB 3-0 SH 27 (SUTURE) ×2
SUT MON AB 3-0 SH27 (SUTURE) ×1 IMPLANT
SUT VIC AB 2-0 CT1 18 (SUTURE) ×3 IMPLANT
SYR BULB IRRIGATION 50ML (SYRINGE) ×3 IMPLANT
SYR CONTROL 10ML LL (SYRINGE) ×3 IMPLANT
TAPE CLOTH 4X10 WHT NS (GAUZE/BANDAGES/DRESSINGS) ×3 IMPLANT
TAPE UMBILICAL COTTON 1/8X30 (MISCELLANEOUS) ×3 IMPLANT
TOWEL GREEN STERILE (TOWEL DISPOSABLE) ×3 IMPLANT
TOWEL GREEN STERILE FF (TOWEL DISPOSABLE) ×3 IMPLANT
WATER STERILE IRR 1000ML POUR (IV SOLUTION) ×3 IMPLANT

## 2019-06-13 NOTE — Anesthesia Procedure Notes (Signed)
Procedure Name: Intubation Date/Time: 06/13/2019 2:09 PM Performed by: Jenne Campus, CRNA Pre-anesthesia Checklist: Patient identified, Emergency Drugs available, Suction available and Patient being monitored Patient Re-evaluated:Patient Re-evaluated prior to induction Oxygen Delivery Method: Circle System Utilized Preoxygenation: Pre-oxygenation with 100% oxygen Induction Type: IV induction Laryngoscope Size: Glidescope and 3 Grade View: Grade I Tube type: Oral Tube size: 7.0 mm Number of attempts: 1 Airway Equipment and Method: Stylet,  Oral airway and Video-laryngoscopy Placement Confirmation: ETT inserted through vocal cords under direct vision,  positive ETCO2 and breath sounds checked- equal and bilateral Secured at: 23 cm Tube secured with: Tape Dental Injury: Teeth and Oropharynx as per pre-operative assessment  Comments: Elective video-glidescope. Smooth IV induction. Dl x 1 Glidescope 3. Grade 1 view. Atraumatic intubation. +ETCO2 BBSE. Head/neck maintained in neutral position during induction and intubation.

## 2019-06-13 NOTE — Discharge Instructions (Signed)
[facility]  POST-OPERATIVE CERVICAL SPINE INSTRUCTIONS   Today you will be discharged from the hospital.  The purpose of the following handout is to help guide you over the next 2 weeks.  First and foremost, be sure you have a follow up appointment with Dr. Rolena Infante 2 weeks from the time of your surgery to have your sutures removed.  Please call [facility] [phone] to schedule or confirm this appointment.      Brace You do not have to wear the collar while lying in bed or sitting in a high-backed chair, eating, sleeping or showering.  Other than these instances, you must wear the brace.  You may NOT wear the collar while driving a vehicle (see driving restrictions below).  It is advisable that you wear the collar in public places or while traveling in a car as a passenger.  Dr. Rolena Infante will discuss further use of the collar at your 2 week postop visit.  Wound Care You may SHOWER 5 days from the date of surgery.  Shower directly over the steri-strips.  DO NOT scrub or submerge (bath tub, swimming pool, hot tub, etc.) the area.  Pat to dry following your shower.  There is no need for additional dressings other than the steri-strips.  Allow the steri-strips to fall off on their own.  Once the strips have fallen off, you may leave the area undressed.  DO NOT apply lotion/cream/ointment to the area.  The wound must remain dry at all times other than while showering.  Dr. Rolena Infante or his staff will remove your stiches at your first postop visit and give you additional instructions regarding wound care at that time.   Activity NO DRIVING FOR 2 WEEKS.  No lifting over 5 pounds (approximately a gallon of milk).  No bending, stooping, squatting or twisting.  No overhead activities.  We encourage you to walk (short distances and often throughout the day) as you can tolerate.  A good rule of thumb is to get up and move once or twice every hour.  You may go up and down stairs carefully.  As you  continue to recover, Dr. Rolena Infante will address and adjust restrictions to your activities until no further restrictions are needed.  However, until your first postop visit, when Dr. Rolena Infante can assess your recovery, you are to follow these instructions.  At the end of this document is a tentative outline of activities for up to 1 year.       Medication You will be discharged from the hospital with medication for pain, spasm, nausea and constipation.  You will be given enough medication to last until your first postop visit in 2 weeks.  Medications WILL NOT BE REFILLED EARLY; therefore, you are to take the medications only as directed.  If you have been given multiple prescriptions, please leave them with your pharmacy.  They can keep them on file for when you need them.  Medications that are lost or stolen WILL NOT be replaced.  We will address the need for continuing certain medications on an individual basis during your postop visit.  We ask that you avoid over the counter anti-inflammatory medications (Advil, Aleve, Motrin) for 3 months.    What you can expect following neck surgery... It is not uncommon to experience a sore throat or difficulty swallowing following neck surgery.  Cold liquids and soft foods are helpful in soothing this discomfort.  There is no specific diet that you are to follow after surgery, however,  there are a few things you should keep in mind to avoid unneeded discomfort.  Take small bites and eat slowly.  Chew your food thoroughly before swallowing.   It is not uncommon to experience incisional soreness or pain in the back of the neck, shoulders or between the shoulder blades.  These symptoms will slowly begin to resolve as you continue to recover, however, they can last for a few weeks.    It is not uncommon to experience INTERMITTENT arm pain following surgery.  This pain can mimic the arm pain you had prior to surgery.  As long as the pain resolves on its own and is not  constant, there is no need to become alarmed.   When To Call If you experience fever >101F, loss of bowel or bladder control, painful swelling in the lower extremities, constant (unresolving) arm pain.  If you experience any of these symptoms, please call [facility] [phone].  What's Next As mentioned earlier, you will follow up with Dr. Rolena Infante in 2 weeks.  At that time, we will likely remove your stitches and discuss additional aspects of your recovery.                   ACTIVITY GUIDELINES ANTERIOR CERVICAL DISECTOMY AND FUSION  Activity Discharge 2 weeks 6 weeks 3 months 6 months 1 year  Shower 5 days        Submerge the wound  no no yes     Walking outdoors yes       Lifting 5 lbs yes       Climbing stairs yes       Cooking yes       Car rides (less than 30 minutes) yes       Car rides (greater than 30 minutes) no varies yes     Air travel no varies yes     Short outings [employer], visits, etc...) yes       School no no yes     Driving a car no no varies yes    Light upper extremity exercises no no varies yes    Stationary bike no no yes     Swimming (no diving) no no no varies yes   Vacuuming, laundry, mopping no no no varies yes   Biking outdoors no no no no varies yes  Light jogging no no no varies yes   Low impact aerobics no no no varies yes   Non-contact sports (tennis, golf) no no no varies yes   Hunting (no tree climbing) no no no varies yes   Dancing (non-gymnastics) no no no varies yes   Down-hill skiing (experienced skier) no no no no yes   Down-hill skiing (novice) no no no no yes   Cross-country skiing no no no no yes   Horseback riding (noncompetitive)  no no no no yes   Horseback riding (competitive) no no no no varies yes  Gardening/landscaping no no no varies yes   House repairs no no no varies varies yes  Lifting up to 50 lbs no no no no varies yes    DO NOT TAKE XANAX AND ROBAXIN TOGETHER.

## 2019-06-13 NOTE — H&P (Signed)
Addendum H&P.  There is been no change in the patient's clinical exam from her last office visit of 06/01/2019. She continues to have significant neck and neuropathic left arm pain in the C7 dermatome.  Plan on moving forward with the ACDF at C6-7.  All appropriate risks benefits and alternatives were discussed with the patient and she expressed understanding.  All of her questions were encouraged and addressed.  If she is doing exceptionally well then we can allow her to be discharged today if not she should be able to go home in the morning

## 2019-06-13 NOTE — Brief Op Note (Signed)
06/13/2019  4:15 PM  PATIENT:  Meredith Cole  73 y.o. female  PRE-OPERATIVE DIAGNOSIS:  Degenerative disc disease, cervicl radiculopathy, foraminal stenosis  POST-OPERATIVE DIAGNOSIS:  Degenerative disc disease, cervicl radiculopathy, foraminal stenosis  PROCEDURE:  Procedure(s) with comments: ANTERIOR CERVICAL DECOMPRESSION/DISCECTOMY FUSION C6-7 (N/A) - 3 hr  SURGEON:  Surgeon(s) and Role:    Melina Schools, MD - Primary  PHYSICIAN ASSISTANT:   ASSISTANTS: Amanda Ward, PA   ANESTHESIA:   general  EBL:  50 mL   BLOOD ADMINISTERED:none  DRAINS: none   LOCAL MEDICATIONS USED:  MARCAINE     SPECIMEN:  No Specimen  DISPOSITION OF SPECIMEN:  N/A  COUNTS:  YES  TOURNIQUET:  * No tourniquets in log *  DICTATION: .Dragon Dictation  PLAN OF CARE: Admit for overnight observation  PATIENT DISPOSITION:  PACU - hemodynamically stable.

## 2019-06-13 NOTE — Plan of Care (Signed)
Patient completed all requiremnts for discharge

## 2019-06-13 NOTE — Op Note (Signed)
Operative report  Preoperative diagnosis: Cervical spondylitic radiculopathy  Postoperative diagnosis same  Operative procedure: Anterior cervical discectomy and fusion C6-7 for left C7 radicular arm pain  First Assistant: Cleta Alberts, PA  Complications: None  Implants: Medtronic Nano lock intervertebral titanium cage: 7 mm medium lordotic.  Depuy: Anterior skyline plate 12 mm length.  Allograft: vivogen  Indications: Meredith Cole is a very pleasant 73 year old man who presents with severe radicular left arm pain in the C7 dermatome and significant neck pain.  Attempts at conservative management consisting of injection therapy, physical therapy, and activity modification had failed to alleviate her symptoms and improve her quality of life.  As result of the failure of conservative management we elected to move forward with surgery.  Preoperative MRI did demonstrate significant foraminal stenosis causing C7 nerve compression and irritation.  Operative report: Patient is brought the operating room placed upon the operating room table.  After successful induction of general anesthesia and endotracheal ovation teds SCDs were applied and she was placed supine on the operating room table.  Her cervical spine was then prepped and draped in a standard fashion.  Timeout was taken to confirm patient procedure and all other important data.  Intraoperative fluoroscopy view was used to identify the C6-7 disc space.  I marked the incision site and infiltrated with quarter percent Marcaine with epinephrine.  Transverse incision was made and sharp.  Section was carried out down to the level of the platysma.  I then dissected through the platysma isolated and transected.  The external jugular was visualized and protected.  I continued dissecting along the medial border the sternocleidomastoid through the deep cervical and prevertebral fascia.  I was able to palpate the anterior aspect of the spine and then bluntly  dissected mobilized the esophagus to the right and palpate and protect the carotid sheath to the left.  Hand-held retractor was placed and I used Kitner dissectors to then complete the dissection to fully expose the C6-7 disc space.  Needles were placed into the C6-7 disc space and intraoperative x-ray was taken and my level confirmed.  Bipolar cautery was used to mobilize the longus coli muscle from the superior aspect of C6 to the inferior aspect of C7.  Once this was done bilaterally I placed my Caspar retractor blades underneath the longus coli muscle deflated the endotracheal cuff and then expanded the retractor blades.  Annulotomy was performed with a 15 blade scalpel and I used pituitary rongeurs to remove the bulk of the disc material.  2 mm Kerrison Roger was used to take down the superior osteophyte from the C6 vertebral body.  Distraction pins were placed into the body of C6 and C7 and I distracted the intervertebral space and maintain the distraction with the retractor system.  Using my neuro curettes I continued removing all of the disc material until I I could visualize the posterior aspect of the vertebral body.  Fine nerve hook was used to dissect into the left posterior lateral gutter and I removed 2 small fragments of disc material.  This allowed me to place my 1 mm Kerrison underneath the uncovertebral joint and resect the osteophyte to complete my direct neural decompression.  I then used a nerve hook to develop a plane under the PLL and I used my 1 mm Kerrison Roger to resect the PLL and posterior annulus to complete the midline decompression.  The posterior osteophytes from the vertebral bodies of C6 and C7 were also taken down with a 1 mm  Kerrison Roger.  At this point I was pleased with the overall decompression discectomy I removed the fragment of disc material consistent with what was seen on the preoperative MRI and I felt as though an adequate decompression of the C7 nerve  root.  Using trial devices I elected to go forward with the size 7 medium lordotic cage.  The implant was obtained and packed with the allograft and then malleted into the appropriate depth.  Once it was properly secured I confirmed satisfactory depth and position with fluoroscopy.  12 mm anterior cervical plate was then affixed to the body of C6 with 14 mm 3.5 diameter screws and then 12 mm 3.8 diameter meter screws into the body of C7.  All 4 screws had excellent purchase.  Using the final locking device the screws were locked to the plate according manufacture standards.  At this point the retractors were removed and I irrigated the wound copiously normal saline.  Hemostasis was obtained using bipolar cautery and thrombin Gelfoam.  After hemostasis is obtained I performed one last irrigation and then returned the esophagus and trachea to midline.  Final intraoperative x-rays were satisfactory in both the AP and lateral planes.  The platysma was closed with interrupted 2-0 Vicryl suture and the skin with a 3-0 Monocryl.  Steri-Strips and a dry dressing were applied and the patient was ultimately extubated transfer the PACU without incident.  The end of the case all needle sponge counts were correct.  There were no adverse intraoperative events.

## 2019-06-13 NOTE — Transfer of Care (Signed)
Immediate Anesthesia Transfer of Care Note  Patient: Meredith Cole  Procedure(s) Performed: ANTERIOR CERVICAL DECOMPRESSION/DISCECTOMY FUSION C6-7 (N/A Spine Cervical)  Patient Location: PACU  Anesthesia Type:General  Level of Consciousness: drowsy and patient cooperative  Airway & Oxygen Therapy: Patient Spontanous Breathing and Patient connected to nasal cannula oxygen  Post-op Assessment: Report given to RN and Post -op Vital signs reviewed and stable  Post vital signs: Reviewed and stable  Last Vitals:  Vitals Value Taken Time  BP 149/78 06/13/19 1624  Temp    Pulse 87 06/13/19 1629  Resp 17 06/13/19 1629  SpO2 100 % 06/13/19 1629  Vitals shown include unvalidated device data.  Last Pain:  Vitals:   06/13/19 1121  TempSrc:   PainSc: 7       Patients Stated Pain Goal: 2 (16/74/25 5258)  Complications: No apparent anesthesia complications

## 2019-06-13 NOTE — Anesthesia Preprocedure Evaluation (Addendum)
Anesthesia Evaluation  Patient identified by MRN, date of birth, ID band Patient awake    Reviewed: Allergy & Precautions, NPO status , Patient's Chart, lab work & pertinent test results  Airway Mallampati: II  TM Distance: >3 FB Neck ROM: Full    Dental  (+) Teeth Intact, Dental Advisory Given   Pulmonary former smoker,    Pulmonary exam normal breath sounds clear to auscultation       Cardiovascular hypertension, Pt. on medications Normal cardiovascular exam Rhythm:Regular Rate:Normal     Neuro/Psych PSYCHIATRIC DISORDERS Anxiety Degenerative disc disease, cervical radiculopathy, foraminal stenosis  Neuromuscular disease (LUE paresthesia)    GI/Hepatic negative GI ROS, Neg liver ROS,   Endo/Other  negative endocrine ROS  Renal/GU negative Renal ROS     Musculoskeletal  (+) Arthritis ,   Abdominal   Peds  Hematology negative hematology ROS (+)   Anesthesia Other Findings Day of surgery medications reviewed with the patient.  Patient's left eye lager than right and irregularly shaped. Patient states she fell on her bedside table and hit her eye. Her eye doctor told her she probably broke some sutures from her cataract surgery. Patient states she can see normally out of her left eye.  Reproductive/Obstetrics                           Anesthesia Physical Anesthesia Plan  ASA: II  Anesthesia Plan: General   Post-op Pain Management:    Induction: Intravenous  PONV Risk Score and Plan: 4 or greater and Midazolam, Dexamethasone, Ondansetron and Treatment may vary due to age or medical condition  Airway Management Planned: Oral ETT and Video Laryngoscope Planned  Additional Equipment:   Intra-op Plan:   Post-operative Plan: Extubation in OR  Informed Consent: I have reviewed the patients History and Physical, chart, labs and discussed the procedure including the risks, benefits and  alternatives for the proposed anesthesia with the patient or authorized representative who has indicated his/her understanding and acceptance.     Dental advisory given  Plan Discussed with: CRNA  Anesthesia Plan Comments: (Risks/benefits of general anesthesia discussed with patient including risk of damage to teeth, lips, gum, and tongue, nausea/vomiting, allergic reactions to medications, and the possibility of heart attack, stroke and death.  All patient questions answered.  Patient wishes to proceed.)        Anesthesia Quick Evaluation

## 2019-06-13 NOTE — Anesthesia Postprocedure Evaluation (Signed)
Anesthesia Post Note  Patient: Meredith Cole  Procedure(s) Performed: ANTERIOR CERVICAL DECOMPRESSION/DISCECTOMY FUSION C6-7 (N/A Spine Cervical)     Patient location during evaluation: PACU Anesthesia Type: General Level of consciousness: awake and alert Pain management: pain level controlled Vital Signs Assessment: post-procedure vital signs reviewed and stable Respiratory status: spontaneous breathing, nonlabored ventilation, respiratory function stable and patient connected to nasal cannula oxygen Cardiovascular status: blood pressure returned to baseline and stable Postop Assessment: no apparent nausea or vomiting Anesthetic complications: no    Last Vitals:  Vitals:   06/13/19 1715 06/13/19 1733  BP: 123/65 (!) 154/72  Pulse: 77 78  Resp: 14 20  Temp:  36.6 C  SpO2: 94% 96%    Last Pain:  Vitals:   06/13/19 1735  TempSrc:   PainSc: 0-No pain                 Effie Berkshire

## 2019-06-13 NOTE — Progress Notes (Signed)
Patient ready for discharge. Patient able to eat meal without difficulty. No nausea noted. Surgical site without no  swelling with honeycomb dressing intact.  Voided to BR without difficulty and ambulated in  Hallway independently . Dr Rolena Infante called and updated about pts condition and okayed patient for discharge.   06/13/19 1906 06/13/19 1911  Vitals  Temp  --  (!) 97.5 F (36.4 C)  Temp Source  --  Oral  BP (!) 156/68  --   MAP (mmHg) 94  --   BP Location Right Arm  --   BP Method Automatic  --   Patient Position (if appropriate) Sitting  --   Pulse Rate 81  --   Pulse Rate Source Monitor  --   Resp 18  --   Oxygen Therapy  SpO2 100 %  --   O2 Device Room Air  --

## 2019-06-14 ENCOUNTER — Encounter (HOSPITAL_COMMUNITY): Payer: Self-pay | Admitting: Orthopedic Surgery

## 2019-06-15 NOTE — Discharge Summary (Signed)
Patient ID: Meredith Cole MRN: [id number] DOB/AGE: December 27, 1945 73 y.o.  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 female who was admitted 06/13/2019 for operative treatment of Cervical spondylitic radiculopathy. Patient failed conservative treatments (please see the history and physical for the specifics) and had severe unremitting pain that affects sleep, daily activities and work/hobbies. After pre-op clearance, the patient was taken to the operating room on 06/13/2019 and underwent  Procedure(s): ANTERIOR CERVICAL DECOMPRESSION/DISCECTOMY FUSION C6-7.    Patient was given perioperative antibiotics:  Anti-infectives (From admission, onward)   Start     Dose/Rate Route Frequency Ordered Stop   06/13/19 2200  ceFAZolin (ANCEF) IVPB 1 g/50 mL premix  Status:  Discontinued     1 g 100 mL/hr over 30 Minutes Intravenous Every 8 hours 06/13/19 1732 06/14/19 0118   06/13/19 1108  ceFAZolin (ANCEF) IVPB 2g/100 mL premix     2 g 200 mL/hr over 30 Minutes Intravenous 30 min pre-op 06/13/19 1108 06/13/19 1427       Patient was given sequential compression devices and early ambulation to prevent DVT.   Patient benefited maximally from  hospital stay and there were no complications. At the time of discharge, the patient was urinating/moving their bowels without difficulty, tolerating a regular diet, pain is controlled with oral pain medications and they have been cleared by PT/OT.   Recent vital signs: No data found.   Recent laboratory studies: No results for input(s): WBC, HGB, HCT, PLT, NA, K, CL, CO2, BUN, CREATININE, GLUCOSE, INR, CALCIUM in the last 72 hours.  Invalid input(s): PT, 2   Discharge Medications:   Allergies as of 06/13/2019      Reactions   Other Other (See Comments)   Seasonal Allergies       Medication List    STOP taking these medications   acetaminophen 500 MG tablet Commonly known as: TYLENOL   ALPRAZolam 1 MG tablet Commonly known as: XANAX   HYDROcodone-acetaminophen 5-325 MG tablet Commonly known as: NORCO/VICODIN   multivitamin with minerals Tabs tablet     TAKE these medications   atorvastatin 20 MG tablet Commonly known as: LIPITOR Take 20 mg by mouth at bedtime.   buPROPion 150 MG 24 hr tablet Commonly known as: WELLBUTRIN XL Take 150 mg by mouth daily.   Combigan 0.2-0.5 % ophthalmic solution Generic drug: brimonidine-timolol Place 1 drop into both eyes 2 (two) times daily.   cycloSPORINE 0.05 % ophthalmic emulsion Commonly known as: RESTASIS Place 1 drop into both eyes 2 (two) times daily.   DULoxetine 60 MG capsule Commonly known as: CYMBALTA Take 60 mg by mouth daily.   fexofenadine 180 MG tablet Commonly known as: ALLEGRA Take 180 mg by mouth daily.   fluticasone 50  MCG/ACT nasal spray Commonly known as: FLONASE Place 1-2 sprays into both nostrils daily as needed for allergies.   hydrochlorothiazide 12.5 MG capsule Commonly known as: MICROZIDE Take 12.5 mg by mouth daily.   latanoprost 0.005 % ophthalmic solution Commonly known as: XALATAN Place 1 drop into both eyes at bedtime.   meclizine 25 MG tablet Commonly known as: ANTIVERT Take 25 mg by  mouth 3 (three) times daily as needed for dizziness.   methocarbamol 500 MG tablet Commonly known as: Robaxin Take 1 tablet (500 mg total) by mouth every 8 (eight) hours as needed for up to 5 days for muscle spasms.   ondansetron 4 MG tablet Commonly known as: Zofran Take 1 tablet (4 mg total) by mouth every 8 (eight) hours as needed for nausea or vomiting.   oxyCODONE-acetaminophen 10-325 MG tablet Commonly known as: Percocet Take 1 tablet by mouth every 6 (six) hours as needed for up to 5 days for pain.   pregabalin 50 MG capsule Commonly known as: LYRICA Take 50 mg by mouth 2 (two) times a day.       Diagnostic Studies: Dg Chest 2 View  Result Date: 06/06/2019 CLINICAL DATA:  Pain.  Hypertension. EXAM: CHEST - 2 VIEW COMPARISON:  None. FINDINGS: The heart size and mediastinal contours are within normal limits. Both lungs are clear. The visualized skeletal structures are unremarkable. IMPRESSION: No active cardiopulmonary disease. Electronically Signed   By: Constance Holster M.D.   On: 06/06/2019 15:59   Dg Cervical Spine 2-3 Views  Result Date: 06/13/2019 CLINICAL DATA:  Elective anterior cervical decompression/discectomy and fusion C6-C7 EXAM: DG C-ARM 61-120 MIN; CERVICAL SPINE - 2-3 VIEW FLUOROSCOPY TIME:  Reported time: 0 minutes, 47 seconds C-arm fluoroscopic images were obtained intraoperatively and submitted for post operative interpretation. Please see the performing provider's procedural report for the fluoroscopy time utilized. COMPARISON:  None. FINDINGS: Postsurgical changes from C6-7 anterior cervical discectomy and fusion with interbody spacer placement. No radiographically evident hardware complication. Patient appears intubated at the time of exam. Remaining osseous structures and soft tissues are unremarkable. No unexpected radiopaque foreign body. IMPRESSION: Expected postsurgical changes from C6-7 ACDF. No unexpected radiopaque foreign body. Electronically Signed    By: MD Lovena Le   On: 06/13/2019 16:50   Dg C-arm 1-60 Min  Result Date: 06/13/2019 CLINICAL DATA:  Elective anterior cervical decompression/discectomy and fusion C6-C7 EXAM: DG C-ARM 61-120 MIN; CERVICAL SPINE - 2-3 VIEW FLUOROSCOPY TIME:  Reported time: 0 minutes, 47 seconds C-arm fluoroscopic images were obtained intraoperatively and submitted for post operative interpretation. Please see the performing provider's procedural report for the fluoroscopy time utilized. COMPARISON:  None. FINDINGS: Postsurgical changes from C6-7 anterior cervical discectomy and fusion with interbody spacer placement. No radiographically evident hardware complication. Patient appears intubated at the time of exam. Remaining osseous structures and soft tissues are unremarkable. No unexpected radiopaque foreign body. IMPRESSION: Expected postsurgical changes from C6-7 ACDF. No unexpected radiopaque foreign body. Electronically Signed   By: MD Lovena Le   On: 06/13/2019 16:50    Discharge Instructions    Incentive spirometry RT   Complete by: As directed       Follow-up Information    Melina Schools, MD. Schedule an appointment as soon as possible for a visit in 2 weeks.   Specialty: Orthopedic Surgery Why: If symptoms worsen, For suture removal, For wound re-check Contact information: [address] [place] [address] [phone]  Discharge Plan:  discharge to home  Disposition: Stable    Signed: Yvonne Kendall Lisel Siegrist for [facility] PA-C [facility] [phone] 06/15/2019, 11:43 AM

## 2020-11-06 DIAGNOSIS — H26493 Other secondary cataract, bilateral: Secondary | ICD-10-CM | POA: Diagnosis not present

## 2020-12-18 DIAGNOSIS — N63 Unspecified lump in unspecified breast: Secondary | ICD-10-CM | POA: Diagnosis not present

## 2020-12-31 DIAGNOSIS — N6001 Solitary cyst of right breast: Secondary | ICD-10-CM | POA: Diagnosis not present

## 2020-12-31 DIAGNOSIS — R928 Other abnormal and inconclusive findings on diagnostic imaging of breast: Secondary | ICD-10-CM | POA: Diagnosis not present

## 2021-01-23 DIAGNOSIS — N952 Postmenopausal atrophic vaginitis: Secondary | ICD-10-CM | POA: Diagnosis not present

## 2021-01-23 DIAGNOSIS — I10 Essential (primary) hypertension: Secondary | ICD-10-CM | POA: Diagnosis not present

## 2021-01-23 DIAGNOSIS — R7309 Other abnormal glucose: Secondary | ICD-10-CM | POA: Diagnosis not present

## 2021-01-23 DIAGNOSIS — F419 Anxiety disorder, unspecified: Secondary | ICD-10-CM | POA: Diagnosis not present

## 2021-01-23 DIAGNOSIS — Z0000 Encounter for general adult medical examination without abnormal findings: Secondary | ICD-10-CM | POA: Diagnosis not present

## 2021-01-23 DIAGNOSIS — N183 Chronic kidney disease, stage 3 unspecified: Secondary | ICD-10-CM | POA: Diagnosis not present

## 2021-01-23 DIAGNOSIS — M8580 Other specified disorders of bone density and structure, unspecified site: Secondary | ICD-10-CM | POA: Diagnosis not present

## 2021-01-23 DIAGNOSIS — E7800 Pure hypercholesterolemia, unspecified: Secondary | ICD-10-CM | POA: Diagnosis not present

## 2021-02-19 DIAGNOSIS — H52223 Regular astigmatism, bilateral: Secondary | ICD-10-CM | POA: Diagnosis not present

## 2021-02-19 DIAGNOSIS — H401131 Primary open-angle glaucoma, bilateral, mild stage: Secondary | ICD-10-CM | POA: Diagnosis not present

## 2021-02-19 DIAGNOSIS — H04123 Dry eye syndrome of bilateral lacrimal glands: Secondary | ICD-10-CM | POA: Diagnosis not present

## 2021-02-19 DIAGNOSIS — H5212 Myopia, left eye: Secondary | ICD-10-CM | POA: Diagnosis not present

## 2021-02-19 DIAGNOSIS — H524 Presbyopia: Secondary | ICD-10-CM | POA: Diagnosis not present

## 2021-05-15 DIAGNOSIS — M79642 Pain in left hand: Secondary | ICD-10-CM | POA: Diagnosis not present

## 2021-05-26 ENCOUNTER — Other Ambulatory Visit: Payer: Self-pay | Admitting: Orthopaedic Surgery

## 2021-05-26 ENCOUNTER — Other Ambulatory Visit: Payer: Self-pay | Admitting: Orthopedic Surgery

## 2021-05-26 DIAGNOSIS — M79642 Pain in left hand: Secondary | ICD-10-CM | POA: Diagnosis not present

## 2021-06-01 ENCOUNTER — Ambulatory Visit
Admission: RE | Admit: 2021-06-01 | Discharge: 2021-06-01 | Disposition: A | Discharge status: Home / Self Care | Payer: Medicare PPO | Source: Ambulatory Visit | Attending: Orthopedic Surgery | Admitting: Orthopedic Surgery

## 2021-06-01 ENCOUNTER — Other Ambulatory Visit: Payer: Self-pay

## 2021-06-01 DIAGNOSIS — R600 Localized edema: Secondary | ICD-10-CM | POA: Diagnosis not present

## 2021-06-01 DIAGNOSIS — M25442 Effusion, left hand: Secondary | ICD-10-CM | POA: Diagnosis not present

## 2021-06-01 DIAGNOSIS — M79642 Pain in left hand: Secondary | ICD-10-CM | POA: Diagnosis not present

## 2021-06-09 DIAGNOSIS — M79642 Pain in left hand: Secondary | ICD-10-CM | POA: Diagnosis not present

## 2021-06-10 DIAGNOSIS — H401122 Primary open-angle glaucoma, left eye, moderate stage: Secondary | ICD-10-CM | POA: Diagnosis not present

## 2021-06-19 DIAGNOSIS — U071 COVID-19: Secondary | ICD-10-CM | POA: Diagnosis not present

## 2021-06-19 DIAGNOSIS — R0981 Nasal congestion: Secondary | ICD-10-CM | POA: Diagnosis not present

## 2021-06-19 DIAGNOSIS — R059 Cough, unspecified: Secondary | ICD-10-CM | POA: Diagnosis not present

## 2021-06-19 DIAGNOSIS — R52 Pain, unspecified: Secondary | ICD-10-CM | POA: Diagnosis not present

## 2021-07-10 DIAGNOSIS — M79642 Pain in left hand: Secondary | ICD-10-CM | POA: Diagnosis not present

## 2021-07-20 DIAGNOSIS — H35353 Cystoid macular degeneration, bilateral: Secondary | ICD-10-CM | POA: Diagnosis not present

## 2021-07-20 DIAGNOSIS — S0532XD Ocular laceration without prolapse or loss of intraocular tissue, left eye, subsequent encounter: Secondary | ICD-10-CM | POA: Diagnosis not present

## 2021-07-20 DIAGNOSIS — H401134 Primary open-angle glaucoma, bilateral, indeterminate stage: Secondary | ICD-10-CM | POA: Diagnosis not present

## 2021-07-21 DIAGNOSIS — H35353 Cystoid macular degeneration, bilateral: Secondary | ICD-10-CM | POA: Diagnosis not present

## 2021-07-21 DIAGNOSIS — Z113 Encounter for screening for infections with a predominantly sexual mode of transmission: Secondary | ICD-10-CM | POA: Diagnosis not present

## 2021-07-22 ENCOUNTER — Ambulatory Visit
Admission: RE | Admit: 2021-07-22 | Discharge: 2021-07-22 | Disposition: A | Discharge status: Home / Self Care | Payer: Medicare PPO | Source: Ambulatory Visit | Attending: Ophthalmology | Admitting: Ophthalmology

## 2021-07-22 ENCOUNTER — Other Ambulatory Visit: Payer: Self-pay | Admitting: Ophthalmology

## 2021-07-22 DIAGNOSIS — H35351 Cystoid macular degeneration, right eye: Secondary | ICD-10-CM | POA: Diagnosis not present

## 2021-07-22 DIAGNOSIS — H35353 Cystoid macular degeneration, bilateral: Secondary | ICD-10-CM

## 2021-07-23 DIAGNOSIS — L814 Other melanin hyperpigmentation: Secondary | ICD-10-CM | POA: Diagnosis not present

## 2021-07-23 DIAGNOSIS — D1801 Hemangioma of skin and subcutaneous tissue: Secondary | ICD-10-CM | POA: Diagnosis not present

## 2021-07-23 DIAGNOSIS — C44529 Squamous cell carcinoma of skin of other part of trunk: Secondary | ICD-10-CM | POA: Diagnosis not present

## 2021-07-23 DIAGNOSIS — D171 Benign lipomatous neoplasm of skin and subcutaneous tissue of trunk: Secondary | ICD-10-CM | POA: Diagnosis not present

## 2021-07-23 DIAGNOSIS — L218 Other seborrheic dermatitis: Secondary | ICD-10-CM | POA: Diagnosis not present

## 2021-07-23 DIAGNOSIS — L821 Other seborrheic keratosis: Secondary | ICD-10-CM | POA: Diagnosis not present

## 2021-07-23 DIAGNOSIS — Z85828 Personal history of other malignant neoplasm of skin: Secondary | ICD-10-CM | POA: Diagnosis not present

## 2021-08-07 DIAGNOSIS — S0532XD Ocular laceration without prolapse or loss of intraocular tissue, left eye, subsequent encounter: Secondary | ICD-10-CM | POA: Diagnosis not present

## 2021-08-07 DIAGNOSIS — H401134 Primary open-angle glaucoma, bilateral, indeterminate stage: Secondary | ICD-10-CM | POA: Diagnosis not present

## 2021-08-07 DIAGNOSIS — H35353 Cystoid macular degeneration, bilateral: Secondary | ICD-10-CM | POA: Diagnosis not present

## 2021-09-03 DIAGNOSIS — M79642 Pain in left hand: Secondary | ICD-10-CM | POA: Diagnosis not present

## 2021-09-11 DIAGNOSIS — H3093 Unspecified chorioretinal inflammation, bilateral: Secondary | ICD-10-CM | POA: Diagnosis not present

## 2021-09-11 DIAGNOSIS — H35353 Cystoid macular degeneration, bilateral: Secondary | ICD-10-CM | POA: Diagnosis not present

## 2021-09-29 DIAGNOSIS — M79642 Pain in left hand: Secondary | ICD-10-CM | POA: Diagnosis not present

## 2021-10-16 DIAGNOSIS — H35353 Cystoid macular degeneration, bilateral: Secondary | ICD-10-CM | POA: Diagnosis not present

## 2021-10-16 DIAGNOSIS — H3093 Unspecified chorioretinal inflammation, bilateral: Secondary | ICD-10-CM | POA: Diagnosis not present

## 2021-10-16 DIAGNOSIS — H401134 Primary open-angle glaucoma, bilateral, indeterminate stage: Secondary | ICD-10-CM | POA: Diagnosis not present

## 2021-12-08 ENCOUNTER — Encounter: Payer: Self-pay | Admitting: Internal Medicine

## 2021-12-09 DIAGNOSIS — Z85828 Personal history of other malignant neoplasm of skin: Secondary | ICD-10-CM | POA: Diagnosis not present

## 2021-12-09 DIAGNOSIS — D692 Other nonthrombocytopenic purpura: Secondary | ICD-10-CM | POA: Diagnosis not present

## 2021-12-11 DIAGNOSIS — M5451 Vertebrogenic low back pain: Secondary | ICD-10-CM | POA: Diagnosis not present

## 2021-12-11 DIAGNOSIS — M5136 Other intervertebral disc degeneration, lumbar region: Secondary | ICD-10-CM | POA: Diagnosis not present

## 2022-01-01 DIAGNOSIS — H401122 Primary open-angle glaucoma, left eye, moderate stage: Secondary | ICD-10-CM | POA: Diagnosis not present

## 2022-01-04 DIAGNOSIS — M5416 Radiculopathy, lumbar region: Secondary | ICD-10-CM | POA: Diagnosis not present

## 2022-01-13 ENCOUNTER — Ambulatory Visit (AMBULATORY_SURGERY_CENTER): Payer: Medicare PPO

## 2022-01-13 ENCOUNTER — Other Ambulatory Visit: Payer: Self-pay

## 2022-01-13 VITALS — Ht 66.5 in | Wt 195.0 lb

## 2022-01-13 DIAGNOSIS — Z800 Family history of malignant neoplasm of digestive organs: Secondary | ICD-10-CM

## 2022-01-13 DIAGNOSIS — Z86010 Personal history of colonic polyps: Secondary | ICD-10-CM

## 2022-01-13 NOTE — Progress Notes (Signed)
No egg or soy allergy known to patient  No issues known to pt with past sedation with any surgeries or procedures Patient denies ever being told they had issues or difficulty with intubation  No FH of Malignant Hyperthermia Pt is not on diet pills Pt is not on home 02  Pt is not on blood thinners  Pt denies issues with constipation;  No A fib or A flutter Pt is fully vaccinated for Covid x 2 + booster; NO PA's for preps discussed with pt in PV today  Discussed with pt there will be an out-of-pocket cost for prep and that varies from $0 to 70 + dollars - pt verbalized understanding  Due to the COVID-19 pandemic we are asking patients to follow certain guidelines in PV and the [place]   Pt aware of COVID protocols and [facility] guidelines  PV completed over the phone. Pt verified name, DOB, address and insurance during PV today.  Pt mailed instruction packet with copy of consent form to read and not return, and instructions.  Pt encouraged to call with questions or issues.  If pt has My chart, procedure instructions sent via My Chart

## 2022-01-23 DIAGNOSIS — M961 Postlaminectomy syndrome, not elsewhere classified: Secondary | ICD-10-CM | POA: Diagnosis not present

## 2022-01-23 DIAGNOSIS — M5416 Radiculopathy, lumbar region: Secondary | ICD-10-CM | POA: Diagnosis not present

## 2022-01-25 ENCOUNTER — Other Ambulatory Visit: Payer: Self-pay

## 2022-01-25 ENCOUNTER — Telehealth: Payer: Self-pay | Admitting: Internal Medicine

## 2022-01-25 DIAGNOSIS — Z86010 Personal history of colonic polyps: Secondary | ICD-10-CM

## 2022-01-25 DIAGNOSIS — Z800 Family history of malignant neoplasm of digestive organs: Secondary | ICD-10-CM

## 2022-01-25 NOTE — Telephone Encounter (Signed)
Inbound call from patient stating that last week she developed an abscessed tooth and is scheduled to have it removed on 2/23 in the morning and is currently on antibiotics. Patient is currently scheduled to have an colonoscopy also on 2/23 at 2:00. Patient is seeking advice if she can still have the colonoscopy. Please advise.

## 2022-01-25 NOTE — Telephone Encounter (Signed)
Pt stated that she had a tooth extraction on the Morning of her Colonoscopy Date: Pt was notified  and agreed that it is going to be hard for the pt to Prep for her Colonoscopy in the Dental Chair. Pt was rescheduled for her Colonoscopy to 03/05/2022 at 10:00 with Dr. Carlean Purl in the [facility]. New Prep instructions created for pt and sent to pt via My Chart. Pt made aware.   Ambulatory GI referral placed in Epic: Pt verbalized understanding with all questions answered.

## 2022-01-27 ENCOUNTER — Encounter: Payer: Medicare PPO | Admitting: Internal Medicine

## 2022-01-28 ENCOUNTER — Encounter: Payer: Medicare PPO | Admitting: Internal Medicine

## 2022-02-05 DIAGNOSIS — H35353 Cystoid macular degeneration, bilateral: Secondary | ICD-10-CM | POA: Diagnosis not present

## 2022-02-05 DIAGNOSIS — H3093 Unspecified chorioretinal inflammation, bilateral: Secondary | ICD-10-CM | POA: Diagnosis not present

## 2022-02-05 DIAGNOSIS — H401113 Primary open-angle glaucoma, right eye, severe stage: Secondary | ICD-10-CM | POA: Diagnosis not present

## 2022-02-09 DIAGNOSIS — M5416 Radiculopathy, lumbar region: Secondary | ICD-10-CM | POA: Diagnosis not present

## 2022-02-23 DIAGNOSIS — N183 Chronic kidney disease, stage 3 unspecified: Secondary | ICD-10-CM | POA: Diagnosis not present

## 2022-02-23 DIAGNOSIS — H401193 Primary open-angle glaucoma, unspecified eye, severe stage: Secondary | ICD-10-CM | POA: Diagnosis not present

## 2022-02-23 DIAGNOSIS — F419 Anxiety disorder, unspecified: Secondary | ICD-10-CM | POA: Diagnosis not present

## 2022-02-23 DIAGNOSIS — H52223 Regular astigmatism, bilateral: Secondary | ICD-10-CM | POA: Diagnosis not present

## 2022-02-23 DIAGNOSIS — F418 Other specified anxiety disorders: Secondary | ICD-10-CM | POA: Diagnosis not present

## 2022-02-23 DIAGNOSIS — H04123 Dry eye syndrome of bilateral lacrimal glands: Secondary | ICD-10-CM | POA: Diagnosis not present

## 2022-02-23 DIAGNOSIS — H5212 Myopia, left eye: Secondary | ICD-10-CM | POA: Diagnosis not present

## 2022-02-23 DIAGNOSIS — M8580 Other specified disorders of bone density and structure, unspecified site: Secondary | ICD-10-CM | POA: Diagnosis not present

## 2022-02-23 DIAGNOSIS — I10 Essential (primary) hypertension: Secondary | ICD-10-CM | POA: Diagnosis not present

## 2022-02-23 DIAGNOSIS — E7800 Pure hypercholesterolemia, unspecified: Secondary | ICD-10-CM | POA: Diagnosis not present

## 2022-02-23 DIAGNOSIS — H524 Presbyopia: Secondary | ICD-10-CM | POA: Diagnosis not present

## 2022-02-23 DIAGNOSIS — H5201 Hypermetropia, right eye: Secondary | ICD-10-CM | POA: Diagnosis not present

## 2022-02-23 DIAGNOSIS — R7309 Other abnormal glucose: Secondary | ICD-10-CM | POA: Diagnosis not present

## 2022-02-23 DIAGNOSIS — Z0000 Encounter for general adult medical examination without abnormal findings: Secondary | ICD-10-CM | POA: Diagnosis not present

## 2022-02-28 DIAGNOSIS — M961 Postlaminectomy syndrome, not elsewhere classified: Secondary | ICD-10-CM | POA: Diagnosis not present

## 2022-03-02 ENCOUNTER — Encounter: Payer: Self-pay | Admitting: Internal Medicine

## 2022-03-05 ENCOUNTER — Ambulatory Visit (AMBULATORY_SURGERY_CENTER): Payer: Medicare PPO | Admitting: Internal Medicine

## 2022-03-05 ENCOUNTER — Encounter: Payer: Self-pay | Admitting: Internal Medicine

## 2022-03-05 VITALS — BP 138/74 | HR 62 | Temp 97.1°F | Resp 9 | Ht 66.5 in | Wt 195.0 lb

## 2022-03-05 DIAGNOSIS — Z86010 Personal history of colonic polyps: Secondary | ICD-10-CM | POA: Diagnosis not present

## 2022-03-05 DIAGNOSIS — I10 Essential (primary) hypertension: Secondary | ICD-10-CM | POA: Diagnosis not present

## 2022-03-05 MED ORDER — SODIUM CHLORIDE 0.9 % IV SOLN: 500.0000 mL | Freq: Once | INTRAVENOUS | Status: DC

## 2022-03-05 NOTE — Progress Notes (Signed)
Pt's states no medical or surgical changes since previsit or office visit. 

## 2022-03-05 NOTE — Progress Notes (Signed)
[facility] History and Physical ? ? ?Primary Care Physician:  London Pepper, MD ? ? ?Reason for Procedure:   Hx colon polyps + FHx CRCA mom at 20 ? ?Plan:    colonoscopy ? ? ? ? ?HPI: Meredith Cole is a 76 y.o. female here for above issues ? 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 Allergy   ? Anxiety   ? hx of  ? Arthritis   ? back  ? Cyst of right kidney   ? Glaucoma   ? uses drops  ? Hyperlipidemia   ? on meds  ? Hypertension   ? on meds  ? Melanoma ([place]) 2016  ? 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 Trichuriasis - whipworm 2009  ? Incidental at colonoscopy - treated with mebendazole  ? Vertigo   ? ? ?Past Surgical History:  ?Procedure Laterality Date  ? ANTERIOR CERVICAL DECOMP/DISCECTOMY FUSION N/A 06/13/2019  ? Procedure: ANTERIOR CERVICAL DECOMPRESSION/DISCECTOMY FUSION C6-7;  Surgeon: Melina Schools, MD;  Location: [facility];  Service: Orthopedics;  Laterality: N/A;  3 hr  ? ARM WOUND REPAIR / CLOSURE    ? WRIST  BROKE WAS REPAIRED  ? bone plate and graft left upper arm    ? CATARACT EXTRACTION W/ INTRAOCULAR LENS  IMPLANT, BILATERAL    ? GLAUCOMA  PROC  ? CESAREAN SECTION    ? CHOLECYSTECTOMY    ? COLON SURGERY  01/2008  ? Dr. Zettie Pho; right hemi-colectomy for tubulovillous adenoma  ? COLONOSCOPY  2002, 2008, 2009 and 06/28/2011  ? 2002: 58m polyp, 2008-2009: large ascending tubulovillous adenoma. 2012: diverticulosis, external hemorrhoids  ? COLONOSCOPY  2017  ? CG-prep exc-TA  ? LUMBAR LAMINECTOMY/DECOMPRESSION MICRODISCECTOMY N/A 01/05/2018  ? Procedure: Decompression Lumbar 4-Lumbar 5 ;  Surgeon: BMelina Schools MD;  [place]  Service: Orthopedics;  Laterality: N/A;  120 mins  ? MELANOMA EXCISION    ? face  ? [place]    ? POLYPECTOMY  2017  ? TA  ? TONSILLECTOMY    ? WISDOM TOOTH EXTRACTION     ? ? ?Prior to Admission medications   ?Medication Sig Start Date End Date Taking? Authorizing Provider  ?atorvastatin (LIPITOR) 20 MG tablet Take 20 mg by mouth at bedtime.    Yes [provider]  ?brimonidine-timolol (COMBIGAN) 0.2-0.5 % ophthalmic solution Place 1 drop into both eyes 2 (two) times daily.   Yes [provider]  ?buPROPion (WELLBUTRIN XL) 150 MG 24 hr tablet Take 150 mg by mouth daily.   Yes [provider]  ?cycloSPORINE (RESTASIS) 0.05 % ophthalmic emulsion Place 1 drop into both eyes 2 (two) times daily.   Yes [provider]  ?dorzolamide-timolol (COSOPT) 22.3-6.8 MG/ML ophthalmic solution Place 1 drop into both eyes daily. 12/09/21  Yes [provider]  ?DULoxetine (CYMBALTA) 60 MG capsule Take 60 mg by mouth daily.   Yes [provider]  ?fexofenadine (ALLEGRA) 180 MG tablet Take 180 mg by mouth daily.     Yes [provider]  ?hydrochlorothiazide (MICROZIDE) 12.5 MG capsule Take 12.5 mg by mouth daily.   Yes [provider]  ?Melatonin 10 MG TABS Take 1 tablet by mouth at bedtime.   Yes [provider]  ?Multiple Vitamin (MULTIVITAMINS PO) Take 1 tablet by mouth daily.   Yes [provider]  ?  tretinoin (RETIN-A) 0.025 % cream Apply 1 application topically daily.   Yes [provider]  ?acetaminophen (TYLENOL) 500 MG tablet Take 1 tablet by mouth every 4 (four) hours as needed.    [provider]  ?estradiol (ESTRACE) 0.1 MG/GM vaginal cream 0.5 gm 01/24/21   [provider]  ?Latanoprostene Bunod (VYZULTA) 0.024 % SOLN Vyzulta 0.024 % eye drops ? INSTILL 1 DROP IN BOTH EYES EVERY DAY IN THE EVENING ?Patient not taking: Reported on 01/13/2022    [provider]  ?meclizine (ANTIVERT) 25 MG tablet Take 25 mg by mouth 3 (three) times daily as needed for dizziness. ?Patient not taking: Reported on 01/13/2022    [provider]  ? ? ?Current Outpatient Medications   ?Medication Sig Dispense Refill  ? atorvastatin (LIPITOR) 20 MG tablet Take 20 mg by mouth at bedtime.     ? brimonidine-timolol (COMBIGAN) 0.2-0.5 % ophthalmic solution Place 1 drop into both eyes 2 (two) times daily.    ? buPROPion (WELLBUTRIN XL) 150 MG 24 hr tablet Take 150 mg by mouth daily.    ? cycloSPORINE (RESTASIS) 0.05 % ophthalmic emulsion Place 1 drop into both eyes 2 (two) times daily.    ? dorzolamide-timolol (COSOPT) 22.3-6.8 MG/ML ophthalmic solution Place 1 drop into both eyes daily.    ? DULoxetine (CYMBALTA) 60 MG capsule Take 60 mg by mouth daily.    ? fexofenadine (ALLEGRA) 180 MG tablet Take 180 mg by mouth daily.      ? hydrochlorothiazide (MICROZIDE) 12.5 MG capsule Take 12.5 mg by mouth daily.    ? Melatonin 10 MG TABS Take 1 tablet by mouth at bedtime.    ? Multiple Vitamin (MULTIVITAMINS PO) Take 1 tablet by mouth daily.    ? tretinoin (RETIN-A) 0.025 % cream Apply 1 application topically daily.    ? acetaminophen (TYLENOL) 500 MG tablet Take 1 tablet by mouth every 4 (four) hours as needed.    ? estradiol (ESTRACE) 0.1 MG/GM vaginal cream 0.5 gm    ? Latanoprostene Bunod (VYZULTA) 0.024 % SOLN Vyzulta 0.024 % eye drops ? INSTILL 1 DROP IN BOTH EYES EVERY DAY IN THE EVENING (Patient not taking: Reported on 01/13/2022)    ? meclizine (ANTIVERT) 25 MG tablet Take 25 mg by mouth 3 (three) times daily as needed for dizziness. (Patient not taking: Reported on 01/13/2022)    ? ?Current Facility-Administered Medications  ?Medication Dose Route Frequency Provider Last Rate Last Admin  ? 0.9 %  sodium chloride infusion  500 mL Intravenous Once Gatha Mayer, MD      ? ? ?Allergies as of 03/05/2022 - Review Complete 03/05/2022  ?Allergen Reaction Noted  ? Lisinopril Other (See Comments) 06/19/2021  ? ? ?Family History  ?Problem Relation Age of Onset  ? Colon polyps Mother 80  ? Colon cancer Mother 27  ?     died at age 42  ? Heart disease Father   ? Esophageal cancer Neg Hx   ? Pancreatic cancer Neg  Hx   ? Rectal cancer Neg Hx   ? Stomach cancer Neg Hx   ? ? ?Social History  ? ?Socioeconomic History  ? Marital status: Married  ?  Spouse name: Not on file  ? Number of children: Not on file  ? Years of education: Not on file  ? Highest education level: Not on file  ?Occupational History  ? Not on file  ?Tobacco Use  ? Smoking status: Former  ?  Packs/day:  1.00  ?  Years: 3.00  ?  Pack years: 3.00  ?  Types: Cigarettes  ?  Quit date: 09/24/1971  ?  Years since quitting: 50.4  ? Smokeless tobacco: Never  ?Vaping Use  ? Vaping Use: Never used  ?Substance and Sexual Activity  ? Alcohol use: Yes  ?  Alcohol/week: 2.0 standard drinks  ?  Types: 2 Glasses of wine per week  ? Drug use: No  ? Sexual activity: Not on file  ?Other Topics Concern  ? Not on file  ?Social History Narrative  ? Not on file  ? ?Social Determinants of Health  ? ?Financial Resource Strain: Not on file  ?Food Insecurity: Not on file  ?Transportation Needs: Not on file  ?Physical Activity: Not on file  ?Stress: Not on file  ?Social Connections: Not on file  ?Intimate Partner Violence: Not on file  ? ? ?Review of Systems: ? ?All other review of systems negative except as mentioned in the HPI. ? ?Physical Exam: ?Vital signs ?BP 131/77   Pulse 80   Temp (!) 97.1 ?F (36.2 ?C)   Resp 13   Ht 5' 6.5" (1.689 m)   Wt 195 lb (88.5 kg)   SpO2 96%   BMI 31.00 kg/m?  ? ?General:   Alert,  Well-developed, well-nourished, pleasant and cooperative in NAD ?Lungs:  Clear throughout to auscultation.   ?Heart:  Regular rate and rhythm; no murmurs, clicks, rubs,  or gallops. ?Abdomen:  Soft, nontender and nondistended. Normal bowel sounds.   ?Neuro/Psych:  Alert and cooperative. Normal mood and affect. A and O x 3 ? ? ?'@Cherrill Scrima'$  Simonne Maffucci, MD, [facility] ?[facility] ?[phone] (pager) ?03/05/2022 10:18 AM@ ? ?

## 2022-03-05 NOTE — Progress Notes (Signed)
To Pacu, VSS. Report to [email or web address] 

## 2022-03-05 NOTE — Patient Instructions (Addendum)
No polyps - again! ? ?I do not recommend any more routine colonoscopy - will save it for problems (hope that does not occur). ? ?I appreciate the opportunity to care for you. ?Gatha Mayer, MD, [facility]   ? ?Handout on diverticulosis given. ? ? ?YOU HAD AN ENDOSCOPIC PROCEDURE TODAY AT [facility]:   Refer to the procedure report that was given to you for any specific questions about what was found during the examination.  If the procedure report does not answer your questions, please call your gastroenterologist to clarify.  If you requested that your care partner not be given the details of your procedure findings, then the procedure report has been included in a sealed envelope for you to review at your convenience later. ? ?YOU SHOULD EXPECT: Some feelings of bloating in the abdomen. Passage of more gas than usual.  Walking can help get rid of the air that was put into your GI tract during the procedure and reduce the bloating. If you had a lower endoscopy (such as a colonoscopy or flexible sigmoidoscopy) you may notice spotting of blood in your stool or on the toilet paper. If you underwent a bowel prep for your procedure, you may not have a normal bowel movement for a few days. ? ?Please Note:  You might notice some irritation and congestion in your nose or some drainage.  This is from the oxygen used during your procedure.  There is no need for concern and it should clear up in a day or so. ? ?SYMPTOMS TO REPORT IMMEDIATELY: ? ?Following lower endoscopy (colonoscopy or flexible sigmoidoscopy): ? Excessive amounts of blood in the stool ? Significant tenderness or worsening of abdominal pains ? Swelling of the abdomen that is new, acute ? Fever of 100?F or higher ? ? ?For urgent or emergent issues, a gastroenterologist can be reached at any hour by calling [phone]. ?Do not use MyChart messaging for urgent concerns.  ? ? ?DIET:  We do recommend a small meal at first, but then you may  proceed to your regular diet.  Drink plenty of fluids but you should avoid alcoholic beverages for 24 hours. ? ?ACTIVITY:  You should plan to take it easy for the rest of today and you should NOT DRIVE or use heavy machinery until tomorrow (because of the sedation medicines used during the test).   ? ?FOLLOW UP: ?Our staff will call the number listed on your records 48-72 hours following your procedure to check on you and address any questions or concerns that you may have regarding the information given to you following your procedure. If we do not reach you, we will leave a message.  We will attempt to reach you two times.  During this call, we will ask if you have developed any symptoms of COVID 19. If you develop any symptoms (ie: fever, flu-like symptoms, shortness of breath, cough etc.) before then, please call [phone].  If you test positive for Covid 19 in the 2 weeks post procedure, please call and report this information to [place].   ? ?If any biopsies were taken you will be contacted by phone or by letter within the next 1-3 weeks.  Please call us at [phone] if you have not heard about the biopsies in 3 weeks.  ? ? ?SIGNATURES/CONFIDENTIALITY: ?You and/or your care partner have signed paperwork which will be entered into your electronic medical record.  These signatures attest to the fact that that the information  above on your After Visit Summary has been reviewed and is understood.  Full responsibility of the confidentiality of this discharge information lies with you and/or your care-partner.  ? ?

## 2022-03-05 NOTE — Op Note (Signed)
[place] ?Patient Name: Meredith Cole ?Procedure Date: 03/05/2022 10:15 AM ?MRN: [id number] ?Endoscopist: Gatha Mayer , MD ?Age: 76 ?Referring MD:  ?Date of Birth: July 01, 1946 ?Gender: Female ?Account #: [id number] ?Procedure:                Colonoscopy ?Indications:              Surveillance: Personal history of adenomatous  ?                          polyps on last colonoscopy 5 years ago, Last  ?                          colonoscopy: 2017 fHx CRCA mom in 89's ?Medicines:                Monitored Anesthesia Care ?Procedure:                Pre-Anesthesia Assessment: ?                          - Prior to the procedure, a History and Physical  ?                          was performed, and patient medications and  ?                          allergies were reviewed. The patient's tolerance of  ?                          previous anesthesia was also reviewed. The risks  ?                          and benefits of the procedure and the sedation  ?                          options and risks were discussed with the patient.  ?                          All questions were answered, and informed consent  ?                          was obtained. Prior Anticoagulants: The patient has  ?                          taken no previous anticoagulant or antiplatelet  ?                          agents. ASA Grade Assessment: II - A patient with  ?                          mild systemic disease. After reviewing the risks  ?                          and benefits, the patient was deemed in  ?  satisfactory condition to undergo the procedure. ?                          After obtaining informed consent, the colonoscope  ?                          was passed under direct vision. Throughout the  ?                          procedure, the patient's blood pressure, pulse, and  ?                          oxygen saturations were monitored continuously. The  ?                          Olympus CF-[id number]  (#[id number]) Colonoscope was  ?                          introduced through the anus and advanced to the the  ?                          ileocolonic anastomosis. The colonoscopy was  ?                          performed without difficulty. The patient tolerated  ?                          the procedure well. The quality of the bowel  ?                          preparation was good. The bowel preparation used  ?                          was Miralax via split dose instruction. The rectum  ?                          and Ileocolonic anastomsis areas were photographed. ?Scope In: 10:29:53 AM ?Scope Out: 10:42:49 AM ?Scope Withdrawal Time: 0 hours 7 minutes 44 seconds  ?Total Procedure Duration: 0 hours 12 minutes 56 seconds  ?Findings:                 The perianal and digital rectal examinations were  ?                          normal. ?                          There was evidence of a prior end-to-end  ?                          colo-colonic anastomosis in the ascending colon.  ?                          This was patent and was characterized by healthy  ?  appearing mucosa. ?                          Scattered diverticula were found in the entire  ?                          colon. ?                          The exam was otherwise without abnormality on  ?                          direct and retroflexion views. ?Complications:            No immediate complications. ?Estimated Blood Loss:     Estimated blood loss: none. ?Impression:               - Patent end-to-end colo-colonic anastomosis,  ?                          characterized by healthy appearing mucosa. ?                          - Diverticulosis in the entire examined colon. ?                          - The examination was otherwise normal on direct  ?                          and retroflexion views. ?                          - No specimens collected. ?                          - Personal history of colonic polyps. FHx CRCA mom  ?                           in 40's ?                          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                          found 01/2008 ?                          Removed with ileo-cecectomy 2009 (July) ?                          Also had adenomas 2002 and 2008 ?                          2017 no polyps ?Recommendation:           - Patient has a contact number available for  ?                          emergencies.  The signs and symptoms of potential  ?                          delayed complications were discussed with the  ?                          patient. Return to normal activities tomorrow.  ?                          Written discharge instructions were provided to the  ?                          patient. ?                          - Resume previous diet. ?                          - Continue present medications. ?                          - No repeat colonoscopy due to age and the absence  ?                          of colonic polyps. ?Gatha Mayer, MD ?03/05/2022 10:48:51 AM ?This report has been signed electronically. ?

## 2022-03-09 ENCOUNTER — Telehealth: Payer: Self-pay | Admitting: *Deleted

## 2022-03-09 NOTE — Telephone Encounter (Signed)
First attempt for follow up phone call. No answer at number given.  Left message on voicemail.   ?

## 2022-03-09 NOTE — Telephone Encounter (Signed)
?  Follow up Call- ? ? ?  03/05/2022  ?  9:38 AM  ?Call back number  ?Post procedure Call Back phone  # [phone]  ?Permission to leave phone message Yes  ?  ? ?Patient questions: ? ?Do you have a fever, pain , or abdominal swelling? No. ?Pain Score  0 * ? ?Have you tolerated food without any problems? Yes.   ? ?Have you been able to return to your normal activities? Yes.   ? ?Do you have any questions about your discharge instructions: ?Diet   No. ?Medications  No. ?Follow up visit  No. ? ?Do you have questions or concerns about your Care? Yes.   ? ?Actions: ?* If pain score is 4 or above: ?No action needed, pain <4. ? ? ?

## 2022-03-22 DIAGNOSIS — Z1231 Encounter for screening mammogram for malignant neoplasm of breast: Secondary | ICD-10-CM | POA: Diagnosis not present

## 2022-03-22 DIAGNOSIS — M85852 Other specified disorders of bone density and structure, left thigh: Secondary | ICD-10-CM | POA: Diagnosis not present

## 2022-03-22 DIAGNOSIS — M85851 Other specified disorders of bone density and structure, right thigh: Secondary | ICD-10-CM | POA: Diagnosis not present

## 2022-03-22 DIAGNOSIS — Z780 Asymptomatic menopausal state: Secondary | ICD-10-CM | POA: Diagnosis not present

## 2022-03-29 DIAGNOSIS — R928 Other abnormal and inconclusive findings on diagnostic imaging of breast: Secondary | ICD-10-CM | POA: Diagnosis not present

## 2022-03-30 DIAGNOSIS — M5416 Radiculopathy, lumbar region: Secondary | ICD-10-CM | POA: Diagnosis not present

## 2022-03-31 DIAGNOSIS — M545 Low back pain, unspecified: Secondary | ICD-10-CM | POA: Diagnosis not present

## 2022-03-31 DIAGNOSIS — M4316 Spondylolisthesis, lumbar region: Secondary | ICD-10-CM | POA: Diagnosis not present

## 2022-03-31 DIAGNOSIS — M5416 Radiculopathy, lumbar region: Secondary | ICD-10-CM | POA: Diagnosis not present

## 2022-04-06 DIAGNOSIS — M160 Bilateral primary osteoarthritis of hip: Secondary | ICD-10-CM | POA: Diagnosis not present

## 2022-04-06 DIAGNOSIS — M5416 Radiculopathy, lumbar region: Secondary | ICD-10-CM | POA: Diagnosis not present

## 2022-04-06 DIAGNOSIS — M25552 Pain in left hip: Secondary | ICD-10-CM | POA: Diagnosis not present

## 2022-04-07 ENCOUNTER — Other Ambulatory Visit: Payer: Self-pay

## 2022-04-07 DIAGNOSIS — N62 Hypertrophy of breast: Secondary | ICD-10-CM | POA: Diagnosis not present

## 2022-04-07 DIAGNOSIS — R928 Other abnormal and inconclusive findings on diagnostic imaging of breast: Secondary | ICD-10-CM | POA: Diagnosis not present

## 2022-04-13 DIAGNOSIS — Z6831 Body mass index (BMI) 31.0-31.9, adult: Secondary | ICD-10-CM | POA: Diagnosis not present

## 2022-04-13 DIAGNOSIS — M25552 Pain in left hip: Secondary | ICD-10-CM | POA: Diagnosis not present

## 2022-04-13 DIAGNOSIS — M5416 Radiculopathy, lumbar region: Secondary | ICD-10-CM | POA: Diagnosis not present

## 2022-04-23 ENCOUNTER — Emergency Department (HOSPITAL_BASED_OUTPATIENT_CLINIC_OR_DEPARTMENT_OTHER)
Admission: EM | Admit: 2022-04-23 | Discharge: 2022-04-23 | Disposition: A | Discharge status: Home / Self Care | Payer: Medicare PPO | Attending: Emergency Medicine | Admitting: Emergency Medicine

## 2022-04-23 ENCOUNTER — Encounter (HOSPITAL_BASED_OUTPATIENT_CLINIC_OR_DEPARTMENT_OTHER): Payer: Self-pay

## 2022-04-23 ENCOUNTER — Other Ambulatory Visit: Payer: Self-pay

## 2022-04-23 ENCOUNTER — Emergency Department (HOSPITAL_BASED_OUTPATIENT_CLINIC_OR_DEPARTMENT_OTHER): Payer: Medicare PPO

## 2022-04-23 DIAGNOSIS — N281 Cyst of kidney, acquired: Secondary | ICD-10-CM | POA: Diagnosis not present

## 2022-04-23 DIAGNOSIS — K5900 Constipation, unspecified: Secondary | ICD-10-CM | POA: Diagnosis present

## 2022-04-23 DIAGNOSIS — K643 Fourth degree hemorrhoids: Secondary | ICD-10-CM | POA: Diagnosis not present

## 2022-04-23 MED ORDER — MORPHINE SULFATE (PF) 4 MG/ML IV SOLN
4.0000 mg | Freq: Once | INTRAVENOUS | Status: AC
  Administered 2022-04-23: 4 mg via INTRAMUSCULAR
  Filled 2022-04-23: qty 1

## 2022-04-23 MED ORDER — LORAZEPAM 1 MG PO TABS
1.0000 mg | ORAL_TABLET | Freq: Once | ORAL | Status: AC
  Administered 2022-04-23: 1 mg via ORAL
  Filled 2022-04-23: qty 1

## 2022-04-23 MED ORDER — FLEET ENEMA 7-19 GM/118ML RE ENEM
1.0000 | ENEMA | Freq: Once | RECTAL | Status: AC
  Administered 2022-04-23: 1 via RECTAL
  Filled 2022-04-23: qty 1

## 2022-04-23 MED ORDER — IOHEXOL 9 MG/ML PO SOLN: 500.0000 mL | ORAL | Status: DC

## 2022-04-23 MED ORDER — HYDROCORT-PRAMOXINE (PERIANAL) 1-1 % EX FOAM: 1.0000 | Freq: Two times a day (BID) | CUTANEOUS | 1 refills | Status: AC

## 2022-04-23 MED ORDER — ONDANSETRON 4 MG PO TBDP
4.0000 mg | ORAL_TABLET | Freq: Once | ORAL | Status: AC
  Administered 2022-04-23: 4 mg via ORAL
  Filled 2022-04-23: qty 1

## 2022-04-23 MED ORDER — LIDOCAINE HCL URETHRAL/MUCOSAL 2 % EX GEL
1.0000 "application " | Freq: Once | CUTANEOUS | Status: AC
  Administered 2022-04-23: 1 via TOPICAL
  Filled 2022-04-23: qty 11

## 2022-04-23 MED ORDER — BARIUM SULFATE 2 % PO SUSP
450.0000 mL | Freq: Once | ORAL | Status: AC
  Administered 2022-04-23: 450 mL via ORAL

## 2022-04-23 NOTE — ED Triage Notes (Signed)
Pt presents to ED from home C/O "mass of stool." Pt reports last BM yesterday.

## 2022-04-23 NOTE — ED Provider Notes (Signed)
[facility] Provider Note   CSN: [id number] Arrival date & time: 04/23/22  1233     History  Chief Complaint  Patient presents with   Fecal Impaction    [facility] is a 76 y.o. female.  Patient is a 76 yo female presenting for fecal impaction. Pt states she struggles with constipation. States yesterday she had one hard bowel movement. States today she feels pressure at rectum. States "I know I need to have a bowel movement, it is hard, and pressure like". Unable to pass any stool at this time. Denies fevers, chills, nausea, vomiting, or previous bowel obstruction. Recent normal colonoscopy 1 month ago.   The history is provided by the patient. No language interpreter was used.      Home Medications Prior to Admission medications   Medication Sig Start Date End Date Taking? Authorizing Provider  acetaminophen (TYLENOL) 500 MG tablet Take 1 tablet by mouth every 4 (four) hours as needed.    [provider]  atorvastatin (LIPITOR) 20 MG tablet Take 20 mg by mouth at bedtime.     [provider]  brimonidine-timolol (COMBIGAN) 0.2-0.5 % ophthalmic solution Place 1 drop into both eyes 2 (two) times daily.    [provider]  buPROPion (WELLBUTRIN XL) 150 MG 24 hr tablet Take 150 mg by mouth daily.    [provider]  cycloSPORINE (RESTASIS) 0.05 % ophthalmic emulsion Place 1 drop into both eyes 2 (two) times daily.    [provider]  dorzolamide-timolol (COSOPT) 22.3-6.8 MG/ML ophthalmic solution Place 1 drop into both eyes daily. 12/09/21   [provider]  DULoxetine (CYMBALTA) 60 MG capsule Take 60 mg by mouth daily.    [provider]  estradiol (ESTRACE) 0.1 MG/GM vaginal cream 0.5 gm 01/24/21   [provider]  fexofenadine (ALLEGRA) 180 MG tablet Take 180 mg by mouth daily.      [provider]  hydrochlorothiazide (MICROZIDE) 12.5 MG capsule Take 12.5 mg by mouth  daily.    [provider]  Latanoprostene Bunod (VYZULTA) 0.024 % SOLN Vyzulta 0.024 % eye drops  INSTILL 1 DROP IN BOTH EYES EVERY DAY IN THE EVENING Patient not taking: Reported on 01/13/2022    [provider]  meclizine (ANTIVERT) 25 MG tablet Take 25 mg by mouth 3 (three) times daily as needed for dizziness. Patient not taking: Reported on 01/13/2022    [provider]  Melatonin 10 MG TABS Take 1 tablet by mouth at bedtime.    [provider]  Multiple Vitamin (MULTIVITAMINS PO) Take 1 tablet by mouth daily.    [provider]  tretinoin (RETIN-A) 0.025 % cream Apply 1 application topically daily.    [provider]      Allergies    Lisinopril    Review of Systems   Review of Systems  Constitutional:  Negative for chills and fever.  HENT:  Negative for ear pain and sore throat.   Eyes:  Negative for pain and visual disturbance.  Respiratory:  Negative for cough and shortness of breath.   Cardiovascular:  Negative for chest pain and palpitations.  Gastrointestinal:  Negative for abdominal pain and vomiting.       Rectal pressure   Genitourinary:  Negative for dysuria and hematuria.  Musculoskeletal:  Negative for arthralgias and back pain.  Skin:  Negative for color change and rash.  Neurological:  Negative for seizures and syncope.  All other systems reviewed and are  negative.  Physical Exam Updated Vital Signs BP (!) 149/83 (BP Location: Right Arm)   Pulse 84   Temp 97.9 F (36.6 C) (Oral)   Resp 18   Ht 5' 6.5" (1.689 m)   Wt 88.5 kg   SpO2 100%   BMI 31.00 kg/m  Physical Exam Vitals and nursing note reviewed.  Constitutional:      General: She is not in acute distress.    Appearance: She is well-developed.  HENT:     Head: Normocephalic and atraumatic.  Eyes:     Conjunctiva/sclera: Conjunctivae normal.  Cardiovascular:     Rate and Rhythm: Normal rate and regular rhythm.     Heart sounds: No murmur  heard. Pulmonary:     Effort: Pulmonary effort is normal. No respiratory distress.     Breath sounds: Normal breath sounds.  Abdominal:     Palpations: Abdomen is soft.     Tenderness: There is no abdominal tenderness.  Musculoskeletal:        General: No swelling.     Cervical back: Neck supple.  Skin:    General: Skin is warm and dry.     Capillary Refill: Capillary refill takes less than 2 seconds.  Neurological:     Mental Status: She is alert.  Psychiatric:        Mood and Affect: Mood normal.         ED Results / Procedures / Treatments   Labs (all labs ordered are listed, but only abnormal results are displayed) Labs Reviewed - No data to display  EKG None  Radiology No results found.  Procedures Procedures    Medications Ordered in ED Medications  LORazepam (ATIVAN) tablet 1 mg (1 mg Oral Given 04/23/22 1512)  morphine (PF) 4 MG/ML injection 4 mg (4 mg Intramuscular Given 04/23/22 1510)  ondansetron (ZOFRAN-ODT) disintegrating tablet 4 mg (4 mg Oral Given 04/23/22 1509)    ED Course/ Medical Decision Making/ A&P                           Medical Decision Making Risk Prescription drug management.   Patient is a 76 yo female presenting for rectal pressure after constipation. Physical exam concerning for prolapsed internal hemrroids grade 4. Stool bolus palpated about a fingers length away. Attempt to manually reduce unsuccessful. Will reach out to GI. On coming provider to assume care.         Final Clinical Impression(s) / ED Diagnoses Final diagnoses:  Prolapsed internal hemorrhoids, grade 4    Rx / DC Orders ED Discharge Orders     None         Lianne Cure, DO 95/28/41 1601

## 2022-04-23 NOTE — ED Notes (Signed)
At bedside with physician for exam as a chaperone.

## 2022-04-23 NOTE — ED Notes (Signed)
Pt reassessed following enema and meds. Interventions successful, pt had large bowel movement. Provider aware. Pt tolerated well and feels better.

## 2022-04-23 NOTE — ED Provider Notes (Signed)
76 yo female here with concern for fecal impaction, constipation for 3 days.  Exam showed concern for likely high-grade hemorrhoid prolapse.  No evidence of thrombosis on my exam.  CT scan showed large rectosigmoid stool ball.  Patient was given enema and oral contrast subsequently had very large bowel movement passing the entirety I suspect of the stool ball.  She has had significant relief of her abdominal pressure.  I reexamined her anus and she continues to have soft, likely prolapsed internal hemorhoids.  The appearance is not consistent with prolapsed colon, I do not see evidence of any ischemic tissue.  I will give her colorectal surgery clinic to establish follow-up, I will provide lidocaine jelly and Proctofoam prescription for home, advised soaks as needed.  She is comfortable at this time, her pain is under control, she verbalized agreement with the plan   Wyvonnia Dusky, MD 04/23/22 2032

## 2022-04-23 NOTE — ED Notes (Signed)
Patient transported to CT 

## 2022-04-23 NOTE — ED Notes (Signed)
2nd sugar water gauge administered

## 2022-04-27 ENCOUNTER — Ambulatory Visit: Payer: Self-pay | Admitting: General Surgery

## 2022-04-27 DIAGNOSIS — N6489 Other specified disorders of breast: Secondary | ICD-10-CM | POA: Diagnosis not present

## 2022-05-05 DIAGNOSIS — M160 Bilateral primary osteoarthritis of hip: Secondary | ICD-10-CM | POA: Diagnosis not present

## 2022-05-07 DIAGNOSIS — H3093 Unspecified chorioretinal inflammation, bilateral: Secondary | ICD-10-CM | POA: Diagnosis not present

## 2022-05-07 DIAGNOSIS — H04123 Dry eye syndrome of bilateral lacrimal glands: Secondary | ICD-10-CM | POA: Diagnosis not present

## 2022-05-07 DIAGNOSIS — H35353 Cystoid macular degeneration, bilateral: Secondary | ICD-10-CM | POA: Diagnosis not present

## 2022-05-07 DIAGNOSIS — H401130 Primary open-angle glaucoma, bilateral, stage unspecified: Secondary | ICD-10-CM | POA: Diagnosis not present

## 2022-05-11 DIAGNOSIS — M25552 Pain in left hip: Secondary | ICD-10-CM | POA: Diagnosis not present

## 2022-05-14 DIAGNOSIS — M5416 Radiculopathy, lumbar region: Secondary | ICD-10-CM | POA: Diagnosis not present

## 2022-06-04 DIAGNOSIS — N1831 Chronic kidney disease, stage 3a: Secondary | ICD-10-CM | POA: Diagnosis not present

## 2022-06-04 DIAGNOSIS — N281 Cyst of kidney, acquired: Secondary | ICD-10-CM | POA: Diagnosis not present

## 2022-06-04 DIAGNOSIS — I10 Essential (primary) hypertension: Secondary | ICD-10-CM | POA: Diagnosis not present

## 2022-06-05 DIAGNOSIS — M5416 Radiculopathy, lumbar region: Secondary | ICD-10-CM | POA: Diagnosis not present

## 2022-06-05 DIAGNOSIS — M961 Postlaminectomy syndrome, not elsewhere classified: Secondary | ICD-10-CM | POA: Diagnosis not present

## 2022-06-07 DIAGNOSIS — K641 Second degree hemorrhoids: Secondary | ICD-10-CM | POA: Diagnosis not present

## 2022-06-17 ENCOUNTER — Encounter (HOSPITAL_BASED_OUTPATIENT_CLINIC_OR_DEPARTMENT_OTHER): Payer: Self-pay | Admitting: General Surgery

## 2022-06-17 ENCOUNTER — Other Ambulatory Visit: Payer: Self-pay

## 2022-06-22 DIAGNOSIS — I10 Essential (primary) hypertension: Secondary | ICD-10-CM | POA: Diagnosis not present

## 2022-06-22 DIAGNOSIS — Z6832 Body mass index (BMI) 32.0-32.9, adult: Secondary | ICD-10-CM | POA: Diagnosis not present

## 2022-06-22 DIAGNOSIS — R7309 Other abnormal glucose: Secondary | ICD-10-CM | POA: Diagnosis not present

## 2022-06-24 DIAGNOSIS — R928 Other abnormal and inconclusive findings on diagnostic imaging of breast: Secondary | ICD-10-CM | POA: Diagnosis not present

## 2022-06-25 ENCOUNTER — Encounter (HOSPITAL_BASED_OUTPATIENT_CLINIC_OR_DEPARTMENT_OTHER)
Admission: RE | Admit: 2022-06-25 | Discharge: 2022-06-25 | Disposition: A | Discharge status: Home / Self Care | Payer: Medicare PPO | Source: Ambulatory Visit | Attending: General Surgery | Admitting: General Surgery

## 2022-06-25 DIAGNOSIS — N6489 Other specified disorders of breast: Secondary | ICD-10-CM | POA: Diagnosis not present

## 2022-06-25 DIAGNOSIS — N6022 Fibroadenosis of left breast: Secondary | ICD-10-CM | POA: Diagnosis not present

## 2022-06-25 DIAGNOSIS — Z87891 Personal history of nicotine dependence: Secondary | ICD-10-CM | POA: Diagnosis not present

## 2022-06-25 DIAGNOSIS — Z01812 Encounter for preprocedural laboratory examination: Secondary | ICD-10-CM | POA: Insufficient documentation

## 2022-06-25 DIAGNOSIS — H3093 Unspecified chorioretinal inflammation, bilateral: Secondary | ICD-10-CM | POA: Diagnosis not present

## 2022-06-25 DIAGNOSIS — F419 Anxiety disorder, unspecified: Secondary | ICD-10-CM | POA: Diagnosis not present

## 2022-06-25 DIAGNOSIS — N6012 Diffuse cystic mastopathy of left breast: Secondary | ICD-10-CM | POA: Diagnosis not present

## 2022-06-25 DIAGNOSIS — I10 Essential (primary) hypertension: Secondary | ICD-10-CM | POA: Diagnosis not present

## 2022-06-25 DIAGNOSIS — D242 Benign neoplasm of left breast: Secondary | ICD-10-CM | POA: Diagnosis not present

## 2022-06-25 DIAGNOSIS — N6042 Mammary duct ectasia of left breast: Secondary | ICD-10-CM | POA: Diagnosis not present

## 2022-06-25 LAB — BASIC METABOLIC PANEL
Anion gap: 10 (ref 5–15)
BUN: 24 mg/dL — ABNORMAL HIGH (ref 8–23)
CO2: 26 mmol/L (ref 22–32)
Calcium: 9.2 mg/dL (ref 8.9–10.3)
Chloride: 104 mmol/L (ref 98–111)
Creatinine, Ser: 1.17 mg/dL — ABNORMAL HIGH (ref 0.44–1.00)
GFR, Estimated: 48 mL/min — ABNORMAL LOW (ref >60)
Glucose, Bld: 105 mg/dL — ABNORMAL HIGH (ref 70–99)
Potassium: 4.2 mmol/L (ref 3.5–5.1)
Sodium: 140 mmol/L (ref 135–145)

## 2022-06-25 NOTE — Progress Notes (Addendum)
Sent text reminding pt to come in for lab work and an EKG today.   Surgical soap given with instructions, pt verbalized understanding. Enhanced Recovery after Surgery  Enhanced Recovery after Surgery is a protocol used to improve the stress on your body and your recovery after surgery.  Patient Instructions  The night before surgery:  No food after midnight. ONLY clear liquids after midnight  The day of surgery (if you do NOT have diabetes):  Drink ONE (1) Pre-Surgery Clear Ensure as directed.   This drink was given to you during your hospital  pre-op appointment visit. The pre-op nurse will instruct you on the time to drink the  Pre-Surgery Ensure depending on your surgery time. Finish the drink at the designated time by the pre-op nurse.  Nothing else to drink after completing the  Pre-Surgery Clear Ensure.  The day of surgery (if you have diabetes): Drink ONE (1) Gatorade 2 (G2) as directed. This drink was given to you during your hospital  pre-op appointment visit.  The pre-op nurse will instruct you on the time to drink the   Gatorade 2 (G2) depending on your surgery time. Color of the Gatorade may vary. Red is not allowed. Nothing else to drink after completing the  Gatorade 2 (G2).         If office.you have questions, please contact your surgeon's office

## 2022-06-28 ENCOUNTER — Ambulatory Visit (HOSPITAL_BASED_OUTPATIENT_CLINIC_OR_DEPARTMENT_OTHER): Payer: Medicare PPO | Admitting: Anesthesiology

## 2022-06-28 ENCOUNTER — Encounter (HOSPITAL_BASED_OUTPATIENT_CLINIC_OR_DEPARTMENT_OTHER): Payer: Self-pay | Admitting: General Surgery

## 2022-06-28 ENCOUNTER — Encounter (HOSPITAL_BASED_OUTPATIENT_CLINIC_OR_DEPARTMENT_OTHER)
Admission: RE | Disposition: A | Discharge status: Home / Self Care | Payer: Self-pay | Source: Home / Self Care | Attending: General Surgery

## 2022-06-28 ENCOUNTER — Other Ambulatory Visit: Payer: Self-pay

## 2022-06-28 ENCOUNTER — Ambulatory Visit (HOSPITAL_BASED_OUTPATIENT_CLINIC_OR_DEPARTMENT_OTHER)
Admission: RE | Admit: 2022-06-28 | Discharge: 2022-06-28 | Disposition: A | Discharge status: Home / Self Care | Payer: Medicare PPO | Attending: General Surgery | Admitting: General Surgery

## 2022-06-28 DIAGNOSIS — N6022 Fibroadenosis of left breast: Secondary | ICD-10-CM | POA: Insufficient documentation

## 2022-06-28 DIAGNOSIS — Z87891 Personal history of nicotine dependence: Secondary | ICD-10-CM | POA: Diagnosis not present

## 2022-06-28 DIAGNOSIS — N6012 Diffuse cystic mastopathy of left breast: Secondary | ICD-10-CM | POA: Insufficient documentation

## 2022-06-28 DIAGNOSIS — N6489 Other specified disorders of breast: Secondary | ICD-10-CM | POA: Diagnosis not present

## 2022-06-28 DIAGNOSIS — D242 Benign neoplasm of left breast: Secondary | ICD-10-CM | POA: Diagnosis not present

## 2022-06-28 DIAGNOSIS — I10 Essential (primary) hypertension: Secondary | ICD-10-CM | POA: Insufficient documentation

## 2022-06-28 DIAGNOSIS — R920 Mammographic microcalcification found on diagnostic imaging of breast: Secondary | ICD-10-CM | POA: Diagnosis not present

## 2022-06-28 DIAGNOSIS — F419 Anxiety disorder, unspecified: Secondary | ICD-10-CM | POA: Insufficient documentation

## 2022-06-28 DIAGNOSIS — N6042 Mammary duct ectasia of left breast: Secondary | ICD-10-CM | POA: Diagnosis not present

## 2022-06-28 DIAGNOSIS — N62 Hypertrophy of breast: Secondary | ICD-10-CM | POA: Diagnosis not present

## 2022-06-28 DIAGNOSIS — R928 Other abnormal and inconclusive findings on diagnostic imaging of breast: Secondary | ICD-10-CM | POA: Diagnosis not present

## 2022-06-28 HISTORY — PX: BREAST LUMPECTOMY WITH RADIOACTIVE SEED LOCALIZATION: SHX6424

## 2022-06-28 SURGERY — BREAST LUMPECTOMY WITH RADIOACTIVE SEED LOCALIZATION
Anesthesia: General | Site: Breast | Laterality: Left

## 2022-06-28 MED ORDER — AMISULPRIDE (ANTIEMETIC) 5 MG/2ML IV SOLN: 10.0000 mg | Freq: Once | INTRAVENOUS | Status: DC | PRN

## 2022-06-28 MED ORDER — OXYCODONE HCL 5 MG PO TABS: 5.0000 mg | ORAL_TABLET | Freq: Four times a day (QID) | ORAL | 0 refills | Status: DC | PRN

## 2022-06-28 MED ORDER — LACTATED RINGERS IV SOLN: INTRAVENOUS | Status: DC

## 2022-06-28 MED ORDER — CELECOXIB 200 MG PO CAPS
200.0000 mg | ORAL_CAPSULE | ORAL | Status: AC
  Administered 2022-06-28: 200 mg via ORAL

## 2022-06-28 MED ORDER — FENTANYL CITRATE (PF) 100 MCG/2ML IJ SOLN
INTRAMUSCULAR | Status: DC | PRN
  Administered 2022-06-28: 50 ug via INTRAVENOUS

## 2022-06-28 MED ORDER — ACETAMINOPHEN 500 MG PO TABS
ORAL_TABLET | ORAL | Status: AC
  Filled 2022-06-28: qty 2

## 2022-06-28 MED ORDER — DEXAMETHASONE SODIUM PHOSPHATE 10 MG/ML IJ SOLN
INTRAMUSCULAR | Status: DC | PRN
  Administered 2022-06-28: 4 mg via INTRAVENOUS

## 2022-06-28 MED ORDER — GABAPENTIN 300 MG PO CAPS
ORAL_CAPSULE | ORAL | Status: AC
Start: 2022-06-28 — End: ?
  Filled 2022-06-28: qty 1

## 2022-06-28 MED ORDER — MIDAZOLAM HCL 5 MG/5ML IJ SOLN
INTRAMUSCULAR | Status: DC | PRN
  Administered 2022-06-28 (×2): 1 mg via INTRAVENOUS

## 2022-06-28 MED ORDER — CEFAZOLIN SODIUM-DEXTROSE 2-4 GM/100ML-% IV SOLN
2.0000 g | INTRAVENOUS | Status: AC
  Administered 2022-06-28: 2 g via INTRAVENOUS

## 2022-06-28 MED ORDER — CEFAZOLIN SODIUM-DEXTROSE 2-4 GM/100ML-% IV SOLN
INTRAVENOUS | Status: AC
  Filled 2022-06-28: qty 100

## 2022-06-28 MED ORDER — FENTANYL CITRATE (PF) 100 MCG/2ML IJ SOLN: 25.0000 ug | INTRAMUSCULAR | Status: DC | PRN

## 2022-06-28 MED ORDER — GABAPENTIN 300 MG PO CAPS: 300.0000 mg | ORAL_CAPSULE | ORAL | Status: DC

## 2022-06-28 MED ORDER — CHLORHEXIDINE GLUCONATE CLOTH 2 % EX PADS: 6.0000 | MEDICATED_PAD | Freq: Once | CUTANEOUS | Status: DC

## 2022-06-28 MED ORDER — FENTANYL CITRATE (PF) 100 MCG/2ML IJ SOLN
INTRAMUSCULAR | Status: AC
  Filled 2022-06-28: qty 2

## 2022-06-28 MED ORDER — ONDANSETRON HCL 4 MG/2ML IJ SOLN
INTRAMUSCULAR | Status: AC
  Filled 2022-06-28: qty 2

## 2022-06-28 MED ORDER — ACETAMINOPHEN 500 MG PO TABS
1000.0000 mg | ORAL_TABLET | ORAL | Status: AC
  Administered 2022-06-28: 1000 mg via ORAL

## 2022-06-28 MED ORDER — ONDANSETRON HCL 4 MG/2ML IJ SOLN
INTRAMUSCULAR | Status: DC | PRN
  Administered 2022-06-28: 4 mg via INTRAVENOUS

## 2022-06-28 MED ORDER — PROPOFOL 10 MG/ML IV BOLUS
INTRAVENOUS | Status: DC | PRN
  Administered 2022-06-28: 100 mg via INTRAVENOUS

## 2022-06-28 MED ORDER — MIDAZOLAM HCL 2 MG/2ML IJ SOLN
INTRAMUSCULAR | Status: AC
  Filled 2022-06-28: qty 2

## 2022-06-28 MED ORDER — DEXAMETHASONE SODIUM PHOSPHATE 10 MG/ML IJ SOLN
INTRAMUSCULAR | Status: AC
  Filled 2022-06-28: qty 1

## 2022-06-28 MED ORDER — LIDOCAINE 2% (20 MG/ML) 5 ML SYRINGE
INTRAMUSCULAR | Status: AC
  Filled 2022-06-28: qty 5

## 2022-06-28 MED ORDER — LIDOCAINE HCL 1 % IJ SOLN
INTRAMUSCULAR | Status: DC | PRN
  Administered 2022-06-28: 50 mg via INTRADERMAL

## 2022-06-28 MED ORDER — BUPIVACAINE-EPINEPHRINE (PF) 0.25% -1:200000 IJ SOLN
INTRAMUSCULAR | Status: DC | PRN
  Administered 2022-06-28: 20 mL

## 2022-06-28 MED ORDER — CELECOXIB 200 MG PO CAPS
ORAL_CAPSULE | ORAL | Status: AC
  Filled 2022-06-28: qty 1

## 2022-06-28 MED ORDER — ONDANSETRON HCL 4 MG/2ML IJ SOLN: 4.0000 mg | Freq: Once | INTRAMUSCULAR | Status: DC | PRN

## 2022-06-28 SURGICAL SUPPLY — 38 items
ADH SKN CLS APL DERMABOND .7 (GAUZE/BANDAGES/DRESSINGS) ×1
APL PRP STRL LF DISP 70% ISPRP (MISCELLANEOUS) ×1
BLADE SURG 15 STRL LF DISP TIS (BLADE) ×1 IMPLANT
BLADE SURG 15 STRL SS (BLADE) ×2
CANISTER SUC SOCK COL 7IN (MISCELLANEOUS) ×2 IMPLANT
CANISTER SUCT 1200ML W/VALVE (MISCELLANEOUS) ×2 IMPLANT
CHLORAPREP W/TINT 26 (MISCELLANEOUS) ×2 IMPLANT
COVER BACK TABLE 60X90IN (DRAPES) ×2 IMPLANT
COVER MAYO STAND STRL (DRAPES) ×2 IMPLANT
COVER PROBE W GEL 5X96 (DRAPES) ×2 IMPLANT
DERMABOND ADVANCED (GAUZE/BANDAGES/DRESSINGS) ×1
DERMABOND ADVANCED .7 DNX12 (GAUZE/BANDAGES/DRESSINGS) ×1 IMPLANT
DRAPE LAPAROSCOPIC ABDOMINAL (DRAPES) ×2 IMPLANT
DRAPE UTILITY XL STRL (DRAPES) ×2 IMPLANT
ELECT COATED BLADE 2.86 ST (ELECTRODE) ×2 IMPLANT
ELECT REM PT RETURN 9FT ADLT (ELECTROSURGICAL) ×2
ELECTRODE REM PT RTRN 9FT ADLT (ELECTROSURGICAL) ×1 IMPLANT
GLOVE BIO SURGEON STRL SZ 6.5 (GLOVE) ×1 IMPLANT
GLOVE BIO SURGEON STRL SZ7.5 (GLOVE) ×5 IMPLANT
GLOVE BIOGEL PI IND STRL 7.5 (GLOVE) IMPLANT
GLOVE BIOGEL PI INDICATOR 7.5 (GLOVE) ×1
GOWN STRL REUS W/ TWL LRG LVL3 (GOWN DISPOSABLE) ×2 IMPLANT
GOWN STRL REUS W/TWL LRG LVL3 (GOWN DISPOSABLE) ×4
KIT MARKER MARGIN INK (KITS) ×2 IMPLANT
NDL HYPO 25X1 1.5 SAFETY (NEEDLE) IMPLANT
NEEDLE HYPO 25X1 1.5 SAFETY (NEEDLE) ×2 IMPLANT
NS IRRIG 1000ML POUR BTL (IV SOLUTION) IMPLANT
PACK BASIN DAY SURGERY FS (CUSTOM PROCEDURE TRAY) ×2 IMPLANT
PENCIL SMOKE EVACUATOR (MISCELLANEOUS) ×2 IMPLANT
SLEEVE SCD COMPRESS KNEE MED (STOCKING) ×2 IMPLANT
SPONGE T-LAP 18X18 ~~LOC~~+RFID (SPONGE) ×2 IMPLANT
SUT MON AB 4-0 PC3 18 (SUTURE) ×2 IMPLANT
SUT VICRYL 3-0 CR8 SH (SUTURE) ×2 IMPLANT
SYR CONTROL 10ML LL (SYRINGE) ×1 IMPLANT
TOWEL GREEN STERILE FF (TOWEL DISPOSABLE) ×2 IMPLANT
TRAY FAXITRON CT DISP (TRAY / TRAY PROCEDURE) ×2 IMPLANT
TUBE CONNECTING 20X1/4 (TUBING) ×2 IMPLANT
YANKAUER SUCT BULB TIP NO VENT (SUCTIONS) IMPLANT

## 2022-06-28 NOTE — Anesthesia Postprocedure Evaluation (Signed)
Anesthesia Post Note  Patient: [facility]  Procedure(s) Performed: LEFT BREAST LUMPECTOMY WITH RADIOACTIVE SEED LOCALIZATION (Left: Breast)     Patient location during evaluation: PACU Anesthesia Type: General Level of consciousness: awake Pain management: pain level controlled Vital Signs Assessment: post-procedure vital signs reviewed and stable Respiratory status: spontaneous breathing, nonlabored ventilation, respiratory function stable and patient connected to nasal cannula oxygen Cardiovascular status: blood pressure returned to baseline and stable Postop Assessment: no apparent nausea or vomiting Anesthetic complications: no   No notable events documented.  Last Vitals:  Vitals:   06/28/22 1215 06/28/22 1244  BP: (!) 154/74 (!) 151/78  Pulse: 72 72  Resp: 18 16  Temp:  36.5 C  SpO2: 96% 96%    Last Pain:  Vitals:   06/28/22 1244  TempSrc:   PainSc: 0-No pain                 [name] P Raevon Broom

## 2022-06-28 NOTE — H&P (Signed)
REFERRING PHYSICIAN: Claudina Lick, MD  PROVIDER: Landry Corporal, MD  MRN: [id number] DOB: 31-Jan-1946 Subjective   Chief Complaint: New Consultation (L complex scelorsing lesion/)   History of Present Illness: [facility] is a 76 y.o. female who is seen today as an office consultation for evaluation of New Consultation (L complex scelorsing lesion/) .   We are asked to see the patient in consultation by Dr. Truman Hayward to evaluate her for a complex sclerosing lesion of the left breast. The patient is a 76 year old white female who recently went for a routine screening mammogram. At that time she was found to have a 7 mm area of distortion in the upper inner quadrant of the left breast. This was biopsied and came back as a complex sclerosing lesion. She has no personal or family history of breast cancer. She does have a family history of colon cancer in her mother. She is otherwise in pretty good health and does not smoke.  Review of Systems: A complete review of systems was obtained from the patient. I have reviewed this information and discussed as appropriate with the patient. See HPI as well for other ROS.  ROS   Medical History: Past Medical History:  Diagnosis Date   Arthritis   Glaucoma (increased eye pressure)   History of cancer   Patient Active Problem List  Diagnosis   Complex sclerosing lesion of left breast   Past Surgical History:  Procedure Laterality Date   CESAREAN SECTION   CHOLECYSTECTOMY    Allergies  Allergen Reactions   Lisinopril Other (See Comments)  COUGH   Current Outpatient Medications on File Prior to Visit  Medication Sig Dispense Refill   estradioL (ESTRACE) 0.01 % (0.1 mg/gram) vaginal cream estradiol 0.01% (0.1 mg/gram) vaginal cream INSERT 0.5 GRAMS VAGINALLY TWICE WEEKLY   hydrocortisone-pramoxine (EPIFOAM) 1-1 % foam Place rectally   atorvastatin (LIPITOR) 20 MG tablet atorvastatin 20 mg tablet TAKE 1 TABLET BY MOUTH EVERY DAY    brimonidine-timoloL (COMBIGAN) 0.2-0.5 % ophthalmic solution Combigan 0.2 %-0.5 % eye drops 1 [drp] by ophthalmic route.   buPROPion (WELLBUTRIN XL) 150 MG XL tablet bupropion HCl XL 150 mg 24 hr tablet, extended release   cycloSPORINE (RESTASIS) 0.05 % ophthalmic emulsion Restasis 0.05 % eye drops in a dropperette INSTILL 1 DROP IN BOTH EYES EVERY 12 HOURS   DULoxetine (CYMBALTA) 60 MG DR capsule duloxetine 60 mg capsule,delayed release TAKE 1 CAPSULE BY MOUTH EVERY DAY   hydroCHLOROthiazide (MICROZIDE) 12.5 mg capsule hydrochlorothiazide 12.5 mg capsule TAKE 1 CAPSULE BY MOUTH EVERY DAY IN THE MORNING   No current facility-administered medications on file prior to visit.   Family History  Problem Relation Age of Onset   Colon cancer Mother   Diabetes Sister    Social History   Tobacco Use  Smoking Status Never  Smokeless Tobacco Never    Social History   Socioeconomic History   Marital status: Married  Tobacco Use   Smoking status: Never   Smokeless tobacco: Never  Vaping Use   Vaping Use: Never used  Substance and Sexual Activity   Alcohol use: Yes   Drug use: Never   Objective:   Vitals:  BP: 130/80  Pulse: 81  Temp: 36.8 C (98.2 F)  SpO2: 91%  Weight: 90.4 kg (199 lb 3.2 oz)  Height: 168.9 cm (5' 6.5")   Body mass index is 31.67 kg/m.  Physical Exam Vitals reviewed.  Constitutional:  General: She is not in acute distress. Appearance: Normal  appearance.  HENT:  Head: Normocephalic and atraumatic.  Right Ear: External ear normal.  Left Ear: External ear normal.  Nose: Nose normal.  Mouth/Throat:  Mouth: Mucous membranes are moist.  Pharynx: Oropharynx is clear.  Eyes:  General: No scleral icterus. Extraocular Movements: Extraocular movements intact.  Conjunctiva/sclera: Conjunctivae normal.  Pupils: Pupils are equal, round, and reactive to light.  Cardiovascular:  Rate and Rhythm: Normal rate and regular rhythm.  Pulses: Normal pulses.   Heart sounds: Normal heart sounds.  Pulmonary:  Effort: Pulmonary effort is normal. No respiratory distress.  Breath sounds: Normal breath sounds.  Abdominal:  General: Bowel sounds are normal.  Palpations: Abdomen is soft.  Tenderness: There is no abdominal tenderness.  Musculoskeletal:  General: No swelling, tenderness or deformity. Normal range of motion.  Cervical back: Normal range of motion and neck supple.  Skin: General: Skin is warm and dry.  Coloration: Skin is not jaundiced.  Neurological:  General: No focal deficit present.  Mental Status: She is alert and oriented to person, place, and time.  Psychiatric:  Mood and Affect: Mood normal.  Behavior: Behavior normal.     Breast: There is a small palpable hematoma in the central portion of the left breast. Other than this there is no palpable mass in either breast. There is no palpable axillary, supraclavicular, or cervical lymphadenopathy.  Labs, Imaging and Diagnostic Testing:  Assessment and Plan:   Diagnoses and all orders for this visit:  Complex sclerosing lesion of left breast - [facility] Case Posting Request; Future    The patient appears to have a 7 mm area of complex sclerosing lesion in the upper inner quadrant of the left breast. Because of its appearance and because there is a 5 to 10% chance of missing something more significant the recommendation is to have this area removed. She would also like to have this done. I have discussed with her in detail the risks and benefits of the operation as well as some of the technical aspects including the use of a radioactive seed for localization and she understands and wishes to proceed.

## 2022-06-28 NOTE — Interval H&P Note (Signed)
History and Physical Interval Note:  06/28/2022 10:36 AM  [facility]  has presented today for surgery, with the diagnosis of LEFT BREAST COMPLEX SCLEROSING LESION.  The various methods of treatment have been discussed with the patient and family. After consideration of risks, benefits and other options for treatment, the patient has consented to  Procedure(s): LEFT BREAST LUMPECTOMY WITH RADIOACTIVE SEED LOCALIZATION (Left) as a surgical intervention.  The patient's history has been reviewed, patient examined, no change in status, stable for surgery.  I have reviewed the patient's chart and labs.  Questions were answered to the patient's satisfaction.     Autumn Messing III

## 2022-06-28 NOTE — Transfer of Care (Signed)
Immediate Anesthesia Transfer of Care Note  Patient: [facility]  Procedure(s) Performed: LEFT BREAST LUMPECTOMY WITH RADIOACTIVE SEED LOCALIZATION (Left: Breast)  Patient Location: PACU  Anesthesia Type:General  Level of Consciousness: drowsy  Airway & Oxygen Therapy: Patient Spontanous Breathing and Patient connected to face mask oxygen  Post-op Assessment: Report given to RN and Post -op Vital signs reviewed and stable  Post vital signs: Reviewed and stable  Last Vitals:  Vitals Value Taken Time  BP 154/85 06/28/22 1142  Temp    Pulse 66 06/28/22 1143  Resp 14 06/28/22 1143  SpO2 99 % 06/28/22 1143  Vitals shown include unvalidated device data.  Last Pain:  Vitals:   06/28/22 0904  TempSrc: Oral  PainSc: 0-No pain         Complications: No notable events documented.

## 2022-06-28 NOTE — Op Note (Signed)
06/28/2022  11:38 AM  PATIENT:  Meredith Cole  76 y.o. female  PRE-OPERATIVE DIAGNOSIS:  LEFT BREAST COMPLEX SCLEROSING LESION  POST-OPERATIVE DIAGNOSIS:  LEFT BREAST COMPLEX SCLEROSING LESION  PROCEDURE:  Procedure(s): LEFT BREAST LUMPECTOMY WITH RADIOACTIVE SEED LOCALIZATION (Left)  SURGEON:  Surgeon(s) and Role:    Jovita Kussmaul, MD - Primary  PHYSICIAN ASSISTANT:   ASSISTANTS: none   ANESTHESIA:   local and general  EBL:  10 mL   BLOOD ADMINISTERED:none  DRAINS: none   LOCAL MEDICATIONS USED:  MARCAINE     SPECIMEN:  Source of Specimen:  left breast tissue  DISPOSITION OF SPECIMEN:  PATHOLOGY  COUNTS:  YES  TOURNIQUET:  * No tourniquets in log *  DICTATION: .Dragon Dictation  After informed consent was obtained the patient was brought to the operating room and placed in the supine position on the operating table.  After adequate induction of general anesthesia the patient's left breast was prepped with ChloraPrep, allowed to dry, and draped in usual sterile manner.  An appropriate timeout was performed.  Previously an I-125 seed was placed in the upper portion of the left breast to mark an area of a complex sclerosing lesion.  The neoprobe was set to I-125 in the area of radioactivity was readily identified.  The area around this was infiltrated with quarter percent Marcaine.  A curvilinear incision was then made along the upper edge of the areola of the left breast with a 15 blade knife.  The incision was carried through the skin and subcutaneous tissue sharply with the electrocautery.  Dissection was then carried towards the radioactive seed under the direction of the neoprobe.  Once I more closely approached the radioactive seed I then removed a circular portion of breast tissue sharply with the electrocautery around the radioactive seed while checking the area of radioactivity frequently.  Once the specimen was removed it was oriented with the appropriate paint  colors.  A specimen radiograph was obtained that showed the clip and seed to be near the center of the specimen.  Specimen was then sent to pathology for further evaluation.  Hemostasis was achieved using the Bovie electrocautery.  The wound was then irrigated with saline and infiltrated with more quarter percent Marcaine.  The deep layer of the incision was then closed with layers of interrupted 3-0 Vicryl stitches.  The skin was closed with interrupted 4-0 Monocryl subcuticular stitches.  Dermabond dressings were applied.  The patient tolerated the procedure well.  At the end of the case all needle sponge and instrument counts were correct.  The patient was then awakened and taken to recovery in stable condition.  PLAN OF CARE: Discharge to home after PACU  PATIENT DISPOSITION:  PACU - hemodynamically stable.   Delay start of Pharmacological VTE agent (>24hrs) due to surgical blood loss or risk of bleeding: not applicable

## 2022-06-28 NOTE — Discharge Instructions (Addendum)
Took Tylenol '1000mg'$  at 9am. May take Tylenol at or after 3pm if needed. Do not exceed 3,000 mg in 24 hours. Took Celebrex at 9am. May take other NSAID (Aleve, Ibuprofen) at or after 3pm if needed..  Call your surgeon if you experience:   1.  Fever over 101.0. 2.  Inability to urinate. 3.  Nausea and/or vomiting. 4.  Extreme swelling or bruising at the surgical site. 5.  Continued bleeding from the incision. 6.  Increased pain, redness or drainage from the incision. 7.  Problems related to your pain medication. 8.  Any problems and/or concerns   Post Anesthesia Home Care Instructions  Activity: Get plenty of rest for the remainder of the day. A responsible individual must stay with you for 24 hours following the procedure.  For the next 24 hours, DO NOT: -Drive a car -Paediatric nurse -Drink alcoholic beverages -Take any medication unless instructed by your physician -Make any legal decisions or sign important papers.  Meals: Start with liquid foods such as gelatin or soup. Progress to regular foods as tolerated. Avoid greasy, spicy, heavy foods. If nausea and/or vomiting occur, drink only clear liquids until the nausea and/or vomiting subsides. Call your physician if vomiting continues.  Special Instructions/Symptoms: Your throat may feel dry or sore from the anesthesia or the breathing tube placed in your throat during surgery. If this causes discomfort, gargle with warm salt water. The discomfort should disappear within 24 hours.  If you had a scopolamine patch placed behind your ear for the management of post- operative nausea and/or vomiting:  1. The medication in the patch is effective for 72 hours, after which it should be removed.  Wrap patch in a tissue and discard in the trash. Wash hands thoroughly with soap and water. 2. You may remove the patch earlier than 72 hours if you experience unpleasant side effects which may include dry mouth, dizziness or visual  disturbances. 3. Avoid touching the patch. Wash your hands with soap and water after contact with the patch.

## 2022-06-28 NOTE — Anesthesia Procedure Notes (Signed)
Procedure Name: LMA Insertion Date/Time: 06/28/2022 11:04 AM  Performed by: Garrel Ridgel, CRNAPre-anesthesia Checklist: Patient identified, Emergency Drugs available, Suction available and Patient being monitored Patient Re-evaluated:Patient Re-evaluated prior to induction Oxygen Delivery Method: Circle system utilized Preoxygenation: Pre-oxygenation with 100% oxygen Induction Type: IV induction Ventilation: Mask ventilation without difficulty LMA: LMA flexible inserted LMA Size: 4.0 Tube type: Oral Number of attempts: 1 Placement Confirmation: positive ETCO2 and breath sounds checked- equal and bilateral Dental Injury: Teeth and Oropharynx as per pre-operative assessment

## 2022-06-28 NOTE — Anesthesia Preprocedure Evaluation (Signed)
Anesthesia Evaluation  Patient identified by MRN, date of birth, ID band Patient awake    Reviewed: Allergy & Precautions, NPO status , Patient's Chart, lab work & pertinent test results  Airway Mallampati: II  TM Distance: >3 FB Neck ROM: Full    Dental no notable dental hx.    Pulmonary former smoker,    Pulmonary exam normal        Cardiovascular hypertension, negative cardio ROS Normal cardiovascular exam     Neuro/Psych Anxiety negative neurological ROS     GI/Hepatic negative GI ROS, Neg liver ROS,   Endo/Other  negative endocrine ROS  Renal/GU negative Renal ROS     Musculoskeletal  (+) Arthritis ,   Abdominal (+) + obese,   Peds  Hematology negative hematology ROS (+)   Anesthesia Other Findings LEFT BREAST COMPLEX SCLEROSING LESION  Reproductive/Obstetrics                             Anesthesia Physical Anesthesia Plan  ASA: 2  Anesthesia Plan: General   Post-op Pain Management:    Induction: Intravenous  PONV Risk Score and Plan: 3 and Ondansetron, Dexamethasone and Treatment may vary due to age or medical condition  Airway Management Planned: LMA  Additional Equipment:   Intra-op Plan:   Post-operative Plan: Extubation in OR  Informed Consent: I have reviewed the patients History and Physical, chart, labs and discussed the procedure including the risks, benefits and alternatives for the proposed anesthesia with the patient or authorized representative who has indicated his/her understanding and acceptance.     Dental advisory given  Plan Discussed with: CRNA  Anesthesia Plan Comments:         Anesthesia Quick Evaluation

## 2022-06-29 ENCOUNTER — Encounter (HOSPITAL_BASED_OUTPATIENT_CLINIC_OR_DEPARTMENT_OTHER): Payer: Self-pay | Admitting: General Surgery

## 2022-06-29 LAB — SURGICAL PATHOLOGY

## 2022-07-02 DIAGNOSIS — G8929 Other chronic pain: Secondary | ICD-10-CM | POA: Diagnosis not present

## 2022-07-02 DIAGNOSIS — M438X6 Other specified deforming dorsopathies, lumbar region: Secondary | ICD-10-CM | POA: Diagnosis not present

## 2022-07-02 DIAGNOSIS — Z9889 Other specified postprocedural states: Secondary | ICD-10-CM | POA: Diagnosis not present

## 2022-07-02 DIAGNOSIS — M961 Postlaminectomy syndrome, not elsewhere classified: Secondary | ICD-10-CM | POA: Diagnosis not present

## 2022-07-02 DIAGNOSIS — M48061 Spinal stenosis, lumbar region without neurogenic claudication: Secondary | ICD-10-CM | POA: Diagnosis not present

## 2022-07-02 DIAGNOSIS — M4726 Other spondylosis with radiculopathy, lumbar region: Secondary | ICD-10-CM | POA: Diagnosis not present

## 2022-07-02 DIAGNOSIS — Z981 Arthrodesis status: Secondary | ICD-10-CM | POA: Diagnosis not present

## 2022-07-02 DIAGNOSIS — M4807 Spinal stenosis, lumbosacral region: Secondary | ICD-10-CM | POA: Diagnosis not present

## 2022-07-02 DIAGNOSIS — M4316 Spondylolisthesis, lumbar region: Secondary | ICD-10-CM | POA: Diagnosis not present

## 2022-07-02 DIAGNOSIS — M5117 Intervertebral disc disorders with radiculopathy, lumbosacral region: Secondary | ICD-10-CM | POA: Diagnosis not present

## 2022-07-05 DIAGNOSIS — N281 Cyst of kidney, acquired: Secondary | ICD-10-CM | POA: Diagnosis not present

## 2022-07-06 ENCOUNTER — Encounter (HOSPITAL_COMMUNITY): Payer: Self-pay

## 2022-08-03 DIAGNOSIS — I10 Essential (primary) hypertension: Secondary | ICD-10-CM | POA: Diagnosis not present

## 2022-08-03 DIAGNOSIS — R7309 Other abnormal glucose: Secondary | ICD-10-CM | POA: Diagnosis not present

## 2022-08-03 DIAGNOSIS — Z6831 Body mass index (BMI) 31.0-31.9, adult: Secondary | ICD-10-CM | POA: Diagnosis not present

## 2022-08-11 DIAGNOSIS — M4319 Spondylolisthesis, multiple sites in spine: Secondary | ICD-10-CM | POA: Diagnosis not present

## 2022-08-11 DIAGNOSIS — M961 Postlaminectomy syndrome, not elsewhere classified: Secondary | ICD-10-CM | POA: Diagnosis not present

## 2022-08-11 DIAGNOSIS — Z9889 Other specified postprocedural states: Secondary | ICD-10-CM | POA: Diagnosis not present

## 2022-08-11 DIAGNOSIS — M4150 Other secondary scoliosis, site unspecified: Secondary | ICD-10-CM | POA: Diagnosis not present

## 2022-08-16 DIAGNOSIS — M5416 Radiculopathy, lumbar region: Secondary | ICD-10-CM | POA: Diagnosis not present

## 2022-08-17 DIAGNOSIS — I10 Essential (primary) hypertension: Secondary | ICD-10-CM | POA: Diagnosis not present

## 2022-08-17 DIAGNOSIS — R0683 Snoring: Secondary | ICD-10-CM | POA: Diagnosis not present

## 2022-08-17 DIAGNOSIS — R002 Palpitations: Secondary | ICD-10-CM | POA: Diagnosis not present

## 2022-08-17 DIAGNOSIS — Z9189 Other specified personal risk factors, not elsewhere classified: Secondary | ICD-10-CM | POA: Diagnosis not present

## 2022-08-17 DIAGNOSIS — E785 Hyperlipidemia, unspecified: Secondary | ICD-10-CM | POA: Diagnosis not present

## 2022-08-27 DIAGNOSIS — M4727 Other spondylosis with radiculopathy, lumbosacral region: Secondary | ICD-10-CM | POA: Diagnosis not present

## 2022-08-27 DIAGNOSIS — M4317 Spondylolisthesis, lumbosacral region: Secondary | ICD-10-CM | POA: Diagnosis not present

## 2022-08-27 DIAGNOSIS — Z9889 Other specified postprocedural states: Secondary | ICD-10-CM | POA: Diagnosis not present

## 2022-08-27 DIAGNOSIS — M4316 Spondylolisthesis, lumbar region: Secondary | ICD-10-CM | POA: Diagnosis not present

## 2022-08-27 DIAGNOSIS — E785 Hyperlipidemia, unspecified: Secondary | ICD-10-CM | POA: Diagnosis not present

## 2022-08-27 DIAGNOSIS — I10 Essential (primary) hypertension: Secondary | ICD-10-CM | POA: Diagnosis not present

## 2022-08-27 DIAGNOSIS — M4150 Other secondary scoliosis, site unspecified: Secondary | ICD-10-CM | POA: Diagnosis not present

## 2022-08-27 DIAGNOSIS — M1612 Unilateral primary osteoarthritis, left hip: Secondary | ICD-10-CM | POA: Diagnosis not present

## 2022-08-27 DIAGNOSIS — M5116 Intervertebral disc disorders with radiculopathy, lumbar region: Secondary | ICD-10-CM | POA: Diagnosis not present

## 2022-08-27 DIAGNOSIS — M5186 Other intervertebral disc disorders, lumbar region: Secondary | ICD-10-CM | POA: Diagnosis not present

## 2022-08-27 DIAGNOSIS — Z981 Arthrodesis status: Secondary | ICD-10-CM | POA: Diagnosis not present

## 2022-08-27 DIAGNOSIS — M4807 Spinal stenosis, lumbosacral region: Secondary | ICD-10-CM | POA: Diagnosis not present

## 2022-08-27 DIAGNOSIS — M48061 Spinal stenosis, lumbar region without neurogenic claudication: Secondary | ICD-10-CM | POA: Diagnosis not present

## 2022-08-27 DIAGNOSIS — M961 Postlaminectomy syndrome, not elsewhere classified: Secondary | ICD-10-CM | POA: Diagnosis not present

## 2022-08-27 HISTORY — PX: SPINAL FUSION: SHX223

## 2022-09-04 ENCOUNTER — Emergency Department (HOSPITAL_BASED_OUTPATIENT_CLINIC_OR_DEPARTMENT_OTHER): Payer: Medicare PPO

## 2022-09-04 ENCOUNTER — Other Ambulatory Visit: Payer: Self-pay

## 2022-09-04 ENCOUNTER — Observation Stay (HOSPITAL_BASED_OUTPATIENT_CLINIC_OR_DEPARTMENT_OTHER)
Admission: EM | Admit: 2022-09-04 | Discharge: 2022-09-06 | Disposition: A | Discharge status: Home / Self Care | Payer: Medicare PPO | Attending: Internal Medicine | Admitting: Internal Medicine

## 2022-09-04 ENCOUNTER — Encounter (HOSPITAL_COMMUNITY): Payer: Self-pay

## 2022-09-04 ENCOUNTER — Encounter (HOSPITAL_BASED_OUTPATIENT_CLINIC_OR_DEPARTMENT_OTHER): Payer: Self-pay | Admitting: *Deleted

## 2022-09-04 DIAGNOSIS — N9489 Other specified conditions associated with female genital organs and menstrual cycle: Secondary | ICD-10-CM

## 2022-09-04 DIAGNOSIS — G8929 Other chronic pain: Secondary | ICD-10-CM | POA: Insufficient documentation

## 2022-09-04 DIAGNOSIS — Z951 Presence of aortocoronary bypass graft: Secondary | ICD-10-CM | POA: Diagnosis not present

## 2022-09-04 DIAGNOSIS — R079 Chest pain, unspecified: Secondary | ICD-10-CM

## 2022-09-04 DIAGNOSIS — E785 Hyperlipidemia, unspecified: Secondary | ICD-10-CM | POA: Diagnosis present

## 2022-09-04 DIAGNOSIS — Z87891 Personal history of nicotine dependence: Secondary | ICD-10-CM | POA: Diagnosis not present

## 2022-09-04 DIAGNOSIS — Z79899 Other long term (current) drug therapy: Secondary | ICD-10-CM | POA: Diagnosis not present

## 2022-09-04 DIAGNOSIS — I10 Essential (primary) hypertension: Secondary | ICD-10-CM | POA: Diagnosis not present

## 2022-09-04 DIAGNOSIS — M545 Low back pain, unspecified: Secondary | ICD-10-CM | POA: Insufficient documentation

## 2022-09-04 DIAGNOSIS — R0789 Other chest pain: Secondary | ICD-10-CM | POA: Diagnosis not present

## 2022-09-04 DIAGNOSIS — D72829 Elevated white blood cell count, unspecified: Secondary | ICD-10-CM | POA: Insufficient documentation

## 2022-09-04 DIAGNOSIS — E876 Hypokalemia: Secondary | ICD-10-CM | POA: Diagnosis not present

## 2022-09-04 DIAGNOSIS — E278 Other specified disorders of adrenal gland: Secondary | ICD-10-CM | POA: Insufficient documentation

## 2022-09-04 DIAGNOSIS — I700 Atherosclerosis of aorta: Secondary | ICD-10-CM | POA: Diagnosis not present

## 2022-09-04 LAB — BASIC METABOLIC PANEL
Anion gap: 16 — ABNORMAL HIGH (ref 5–15)
BUN: 14 mg/dL (ref 8–23)
CO2: 23 mmol/L (ref 22–32)
Calcium: 9.8 mg/dL (ref 8.9–10.3)
Chloride: 101 mmol/L (ref 98–111)
Creatinine, Ser: 1.13 mg/dL — ABNORMAL HIGH (ref 0.44–1.00)
GFR, Estimated: 50 mL/min — ABNORMAL LOW (ref >60)
Glucose, Bld: 138 mg/dL — ABNORMAL HIGH (ref 70–99)
Potassium: 3.4 mmol/L — ABNORMAL LOW (ref 3.5–5.1)
Sodium: 140 mmol/L (ref 135–145)

## 2022-09-04 LAB — CBC
HCT: 36.8 % (ref 36.0–46.0)
Hemoglobin: 12.5 g/dL (ref 12.0–15.0)
MCH: 33.5 pg (ref 26.0–34.0)
MCHC: 34 g/dL (ref 30.0–36.0)
MCV: 98.7 fL (ref 80.0–100.0)
Platelets: 447 10*3/uL — ABNORMAL HIGH (ref 150–400)
RBC: 3.73 MIL/uL — ABNORMAL LOW (ref 3.87–5.11)
RDW: 12.4 % (ref 11.5–15.5)
WBC: 11.7 10*3/uL — ABNORMAL HIGH (ref 4.0–10.5)
nRBC: 0 % (ref 0.0–0.2)

## 2022-09-04 LAB — TROPONIN I (HIGH SENSITIVITY): Troponin I (High Sensitivity): 3 ng/L (ref <18)

## 2022-09-04 MED ORDER — ONDANSETRON HCL 4 MG/2ML IJ SOLN
4.0000 mg | Freq: Once | INTRAMUSCULAR | Status: AC
  Administered 2022-09-04: 4 mg via INTRAVENOUS
  Filled 2022-09-04: qty 2

## 2022-09-04 MED ORDER — NITROGLYCERIN 0.4 MG SL SUBL
0.4000 mg | SUBLINGUAL_TABLET | Freq: Once | SUBLINGUAL | Status: AC
Start: 2022-09-04 — End: 2022-09-04
  Administered 2022-09-04: 0.4 mg via SUBLINGUAL
  Filled 2022-09-04: qty 1

## 2022-09-04 MED ORDER — KETOROLAC TROMETHAMINE 30 MG/ML IJ SOLN
15.0000 mg | Freq: Once | INTRAMUSCULAR | Status: AC
  Administered 2022-09-04: 15 mg via INTRAVENOUS
  Filled 2022-09-04: qty 1

## 2022-09-04 MED ORDER — IOHEXOL 350 MG/ML SOLN
100.0000 mL | Freq: Once | INTRAVENOUS | Status: AC | PRN
  Administered 2022-09-04: 100 mL via INTRAVENOUS

## 2022-09-04 MED ORDER — MORPHINE SULFATE (PF) 4 MG/ML IV SOLN
4.0000 mg | Freq: Once | INTRAVENOUS | Status: AC
  Administered 2022-09-04: 4 mg via INTRAVENOUS
  Filled 2022-09-04: qty 1

## 2022-09-04 MED ORDER — ASPIRIN 81 MG PO CHEW
324.0000 mg | CHEWABLE_TABLET | Freq: Once | ORAL | Status: AC
  Administered 2022-09-04: 324 mg via ORAL
  Filled 2022-09-04: qty 4

## 2022-09-04 NOTE — ED Notes (Signed)
Dr Wyvonnia Dusky is seeing pt

## 2022-09-04 NOTE — ED Notes (Signed)
RT place PIV in left AC and obtained blood samples w/out difficulty. Pt tolerated well. PIV secured, saline locked, flushed.

## 2022-09-04 NOTE — ED Triage Notes (Signed)
Pt had spinal fusion by Dr. Jenne Campus at [facility] Friday the 9/22 and the recovery was going well.  Pt arrives in severe pain.  Today at 6pm she began having pain in left chest and this pain is going into left arm and left back.  This pain is also going down back. This pain began at rest.

## 2022-09-04 NOTE — ED Provider Notes (Signed)
[facility] EMERGENCY DEPT Provider Note   CSN: [id number] Arrival date & time: 09/04/22  2028     History  Chief Complaint  Patient presents with   Back Pain   Chest Pain    Meredith Cole is a 76 y.o. female.  Level 5 caveat for acuity of condition.  Patient underwent lumbar fusion on September 22 at [facility].  She been doing very well until tonight.  She had sudden onset left-sided chest pain going to her flank and back about 6 PM.  She describes the pain as a severe crushing pain involving her left chest involving her left arm and back.  Associate with some shortness of breath due to the pain.  No cough or fever.  No leg pain or leg swelling.  States this is totally different than her back pain.  She is never had this kind of pain before.  Denies any cardiac history.  Denies any history of blood thinner use.  No numbness or tingling.  No history of diabetes or hypertension. No new weakness, numbness or tingling.  No bowel or bladder incontinence.  The history is provided by the patient. The history is limited by the condition of the patient.  Back Pain Associated symptoms: chest pain   Associated symptoms: no dysuria, no fever, no headaches and no weakness   Chest Pain Associated symptoms: back pain and shortness of breath   Associated symptoms: no dizziness, no fever, no headache, no nausea, no vomiting and no weakness        Home Medications Prior to Admission medications   Medication Sig Start Date End Date Taking? Authorizing Provider  acetaminophen (TYLENOL) 500 MG tablet Take 1 tablet by mouth every 4 (four) hours as needed.    [provider]  atorvastatin (LIPITOR) 20 MG tablet Take 20 mg by mouth at bedtime.     [provider]  brimonidine-timolol (COMBIGAN) 0.2-0.5 % ophthalmic solution Place 1 drop into both eyes 2 (two) times daily.    [provider]  buPROPion (WELLBUTRIN XL) 150 MG 24 hr tablet Take  150 mg by mouth daily.    [provider]  cycloSPORINE (RESTASIS) 0.05 % ophthalmic emulsion Place 1 drop into both eyes 2 (two) times daily.    [provider]  dorzolamide-timolol (COSOPT) 22.3-6.8 MG/ML ophthalmic solution Place 1 drop into both eyes daily. 12/09/21   [provider]  DULoxetine (CYMBALTA) 60 MG capsule Take 60 mg by mouth daily.    [provider]  estradiol (ESTRACE) 0.1 MG/GM vaginal cream 0.5 gm 01/24/21   [provider]  fexofenadine (ALLEGRA) 180 MG tablet Take 180 mg by mouth daily.      [provider]  hydrochlorothiazide (MICROZIDE) 12.5 MG capsule Take 12.5 mg by mouth daily.    [provider]  Latanoprostene Bunod (VYZULTA) 0.024 % SOLN Vyzulta 0.024 % eye drops  INSTILL 1 DROP IN BOTH EYES EVERY DAY IN THE EVENING Patient not taking: Reported on 01/13/2022    [provider]  meclizine (ANTIVERT) 25 MG tablet Take 25 mg by mouth 3 (three) times daily as needed for dizziness. Patient not taking: Reported on 01/13/2022    [provider]  Melatonin 10 MG TABS Take 1 tablet by mouth at bedtime.    [provider]  Multiple Vitamin (MULTIVITAMINS PO) Take 1 tablet by mouth daily.    [provider]  oxyCODONE (ROXICODONE) 5 MG immediate release tablet Take 1 tablet (  5 mg total) by mouth every 6 (six) hours as needed for severe pain. 06/28/22   Autumn Messing III, MD  tretinoin (RETIN-A) 0.025 % cream Apply 1 application topically daily.    [provider]      Allergies    Lisinopril    Review of Systems   Review of Systems  Constitutional:  Negative for activity change, appetite change and fever.  Respiratory:  Positive for chest tightness and shortness of breath.   Cardiovascular:  Positive for chest pain.  Gastrointestinal:  Negative for nausea and vomiting.  Genitourinary:  Negative for dysuria.  Musculoskeletal:  Positive for back pain.  Skin:  Negative for  rash.  Neurological:  Negative for dizziness, weakness and headaches.   all other systems are negative except as noted in the HPI and PMH.    Physical Exam Updated Vital Signs BP (!) 174/83 (BP Location: Left Arm)   Pulse 93   Temp 97.9 F (36.6 C) (Oral)   Resp (!) 22   Wt 85.7 kg   SpO2 100%   BMI 30.05 kg/m  Physical Exam Vitals and nursing note reviewed.  Constitutional:      General: She is in acute distress.     Appearance: She is well-developed. She is ill-appearing.     Comments: Uncomfortable  HENT:     Head: Normocephalic and atraumatic.     Mouth/Throat:     Pharynx: No oropharyngeal exudate.  Eyes:     Conjunctiva/sclera: Conjunctivae normal.     Pupils: Pupils are equal, round, and reactive to light.  Neck:     Comments: No meningismus. Cardiovascular:     Rate and Rhythm: Normal rate and regular rhythm.     Heart sounds: Normal heart sounds. No murmur heard. Pulmonary:     Effort: Pulmonary effort is normal. No respiratory distress.     Breath sounds: Normal breath sounds.     Comments: No rash.  Chest pain is not reproducible Chest:     Chest wall: No tenderness.  Abdominal:     Palpations: Abdomen is soft.     Tenderness: There is no abdominal tenderness. There is no guarding or rebound.  Musculoskeletal:        General: Tenderness present. Normal range of motion.     Cervical back: Normal range of motion and neck supple.     Comments: Intact DP and PT pulses.  Intact radial pulses bilaterally  Healing Lumbar incision with scattered ecchymosis.  No warmth or erythema.  Skin:    General: Skin is warm.  Neurological:     Mental Status: She is alert and oriented to person, place, and time.     Cranial Nerves: No cranial nerve deficit.     Motor: No abnormal muscle tone.     Coordination: Coordination normal.     Comments:  5/5 strength throughout. CN 2-12 intact.Equal grip strength.   Psychiatric:        Behavior: Behavior normal.     ED  Results / Procedures / Treatments   Labs (all labs ordered are listed, but only abnormal results are displayed) Labs Reviewed  BASIC METABOLIC PANEL - Abnormal; Notable for the following components:      Result Value   Potassium 3.4 (*)    Glucose, Bld 138 (*)    Creatinine, Ser 1.13 (*)    GFR, Estimated 50 (*)    Anion gap 16 (*)    All other components within normal limits  CBC -  Abnormal; Notable for the following components:   WBC 11.7 (*)    RBC 3.73 (*)    Platelets 447 (*)    All other components within normal limits  HEPATIC FUNCTION PANEL  LIPASE, BLOOD  TROPONIN I (HIGH SENSITIVITY)  TROPONIN I (HIGH SENSITIVITY)    EKG EKG Interpretation  Date/Time:  Saturday September 04 2022 21:03:21 EDT Ventricular Rate:  89 PR Interval:  130 QRS Duration: 86 QT Interval:  368 QTC Calculation: 447 R Axis:   38 Text Interpretation: Normal sinus rhythm ST & T wave abnormality, consider inferior ischemia Abnormal ECG When compared with ECG of 25-Jun-2022 10:49, T wave inversion now evident in Inferior leads No significant change was found Confirmed by Ezequiel Essex [phone]) on 09/04/2022 9:23:08 PM  Radiology CT Angio Chest/Abd/Pel for Dissection W and/or Wo Contrast  Result Date: 09/04/2022 CLINICAL DATA:  Chest and abdominal pain EXAM: CT ANGIOGRAPHY CHEST, ABDOMEN AND PELVIS TECHNIQUE: Non-contrast CT of the chest was initially obtained. Multidetector CT imaging through the chest, abdomen and pelvis was performed using the standard protocol during bolus administration of intravenous contrast. Multiplanar reconstructed images and MIPs were obtained and reviewed to evaluate the vascular anatomy. RADIATION DOSE REDUCTION: This exam was performed according to the departmental dose-optimization program which includes automated exposure control, adjustment of the mA and/or kV according to patient size and/or use of iterative reconstruction technique. CONTRAST:  133m OMNIPAQUE IOHEXOL  350 MG/ML SOLN COMPARISON:  04/23/2022 FINDINGS: CTA CHEST FINDINGS Cardiovascular: Initial precontrast images demonstrate atherosclerotic calcification. No hyperdense crescent to suggest acute aortic injury is noted. Post-contrast images demonstrate similar aortic atherosclerotic changes without aneurysmal dilatation or dissection. Heart is not significantly enlarged in size. Mild coronary calcifications are seen. Pulmonary artery shows no evidence of pulmonary emboli although not specifically timed for embolus evaluation. Mediastinum/Nodes: Thoracic inlet is within normal limits. Scattered small mediastinal nodes are seen although not significant by size criteria. These may be reactive in nature. The esophagus is within normal limits. Lungs/Pleura: Lungs are well aerated bilaterally. No focal infiltrate or sizable effusion is seen. No parenchymal nodules are seen. No pneumothorax is noted. Musculoskeletal: No acute rib abnormality is noted. Mild degenerative changes of the thoracic spine are seen. Postsurgical changes are noted in the lower cervical spine. No compression deformity is noted. Review of the MIP images confirms the above findings. CTA ABDOMEN AND PELVIS FINDINGS VASCULAR Aorta: Normal caliber aorta without aneurysm, dissection, vasculitis or significant stenosis. Celiac: Patent without evidence of aneurysm, dissection, vasculitis or significant stenosis. SMA: Patent without evidence of aneurysm, dissection, vasculitis or significant stenosis. Renals: Both renal arteries are patent without evidence of aneurysm, dissection, vasculitis, fibromuscular dysplasia or significant stenosis. IMA: Patent without evidence of aneurysm, dissection, vasculitis or significant stenosis. Inflow: Iliacs demonstrate mild atherosclerotic calcifications. No aneurysmal dilatation or dissection is seen. Veins: No specific venous abnormality is noted. Review of the MIP images confirms the above findings. NON-VASCULAR  Hepatobiliary: Fatty infiltration of the liver is noted. The gallbladder has been surgically removed. Pancreas: Unremarkable. No pancreatic ductal dilatation or surrounding inflammatory changes. Spleen: Normal in size without focal abnormality. Adrenals/Urinary Tract: Adrenal glands are within normal limits. Kidneys demonstrate a normal enhancement pattern. No renal calculi are seen. Large lobular renal cyst is noted on the left measuring 6.1 cm stable in appearance from a prior CT examination. Given its stability in simple appearing nature. No further follow-up is recommended. No obstructive changes are seen. Bladder is decompressed. Stomach/Bowel: Very mild diverticular change of the colon is  noted. No obstructive or inflammatory changes of the colon are seen. Changes of right hemicolectomy are noted. The anastomosis appears widely patent. The appendix is not visualized likely related to the prior surgery. Small bowel and stomach are unremarkable. Lymphatic: No lymphadenopathy is identified. Reproductive: Uterus and right adnexa are unremarkable. Left adnexa demonstrates a 3.4 cm lesion. This has slightly decreased attenuation although is not cystic in nature. Patient is postmenopausal. Other: No abdominal wall hernia or abnormality. No abdominopelvic ascites. Musculoskeletal: Postsurgical changes in the lower lumbar spine are noted. No acute bony abnormality is seen. Review of the MIP images confirms the above findings. IMPRESSION: No evidence of aortic dissection or aneurysmal dilatation. No evidence of pulmonary emboli. Diverticulosis without diverticulitis. 3.4 cm left adnexal lesion in a postmenopausal patient. Given the postmenopausal state prompt follow-up ultrasound is recommended. Reference: JACR 2020 Feb;17(2):248-254 Electronically Signed   By: Inez Catalina M.D.   On: 09/04/2022 22:57    Procedures .Critical Care  Performed by: Ezequiel Essex, MD Authorized by: Ezequiel Essex, MD   Critical  care provider statement:    Critical care time (minutes):  30   Critical care time was exclusive of:  Separately billable procedures and treating other patients   Critical care was necessary to treat or prevent imminent or life-threatening deterioration of the following conditions: chest pain concern for aortic dissection.   Critical care was time spent personally by me on the following activities:  Development of treatment plan with patient or surrogate, discussions with consultants, evaluation of patient's response to treatment, examination of patient, ordering and review of laboratory studies, ordering and review of radiographic studies, ordering and performing treatments and interventions, pulse oximetry, re-evaluation of patient's condition and review of old charts   I assumed direction of critical care for this patient from another provider in my specialty: no     Care discussed with: admitting provider       Medications Ordered in ED Medications  morphine (PF) 4 MG/ML injection 4 mg (has no administration in time range)  ondansetron (ZOFRAN) injection 4 mg (has no administration in time range)    ED Course/ Medical Decision Making/ A&P                           Medical Decision Making Amount and/or Complexity of Data Reviewed Labs: ordered. Decision-making details documented in ED Course. Radiology: ordered and independent interpretation performed. Decision-making details documented in ED Course. ECG/medicine tests: ordered and independent interpretation performed. Decision-making details documented in ED Course.  Risk OTC drugs. Prescription drug management. Decision regarding hospitalization.   Severe onset of crushing chest pain involving her left arm and leg.  Recent spinal fusion about 1 week ago.  EKG shows no acute ischemia.  Patient quite uncomfortable.  There is high concern for pulmonary embolism or aortic dissection given her degree of discomfort.  Given aspirin,  morphine, Zofran.  Will obtain labs and imaging.  Troponin is negative.  CT angiogram is negative for dissection or pulmonary embolism or other acute pathology.  Heart score is 6.  Pain gradually improved with multiple doses of nitroglycerin, morphine and ketorolac.  Unclear etiology of patient's chest pain but does not appear related to her recent spinal procedure.  There is no evidence of aortic dissection or pulmonary embolism.  Initial troponin is negative second 1 is pending.  Recurrent chest pain that is recurrent for unstable angina. Will admit overnight given her recurrent chest pain of uncertain  etiology.  Discussed with Dr. Alcario Drought.        Final Clinical Impression(s) / ED Diagnoses Final diagnoses:  Chest pain, unspecified type    Rx / DC Orders ED Discharge Orders     None         Julyan Gales, Annie Main, MD 09/05/22 [phone]

## 2022-09-05 ENCOUNTER — Observation Stay (HOSPITAL_COMMUNITY): Payer: Medicare PPO

## 2022-09-05 ENCOUNTER — Encounter (HOSPITAL_COMMUNITY): Payer: Self-pay

## 2022-09-05 DIAGNOSIS — Z87891 Personal history of nicotine dependence: Secondary | ICD-10-CM | POA: Diagnosis not present

## 2022-09-05 DIAGNOSIS — I340 Nonrheumatic mitral (valve) insufficiency: Secondary | ICD-10-CM | POA: Diagnosis not present

## 2022-09-05 DIAGNOSIS — M545 Low back pain, unspecified: Secondary | ICD-10-CM | POA: Diagnosis not present

## 2022-09-05 DIAGNOSIS — R079 Chest pain, unspecified: Secondary | ICD-10-CM | POA: Diagnosis not present

## 2022-09-05 DIAGNOSIS — N9489 Other specified conditions associated with female genital organs and menstrual cycle: Secondary | ICD-10-CM

## 2022-09-05 DIAGNOSIS — E785 Hyperlipidemia, unspecified: Secondary | ICD-10-CM

## 2022-09-05 DIAGNOSIS — I10 Essential (primary) hypertension: Secondary | ICD-10-CM | POA: Diagnosis not present

## 2022-09-05 DIAGNOSIS — Z79899 Other long term (current) drug therapy: Secondary | ICD-10-CM | POA: Diagnosis not present

## 2022-09-05 DIAGNOSIS — D72829 Elevated white blood cell count, unspecified: Secondary | ICD-10-CM | POA: Diagnosis not present

## 2022-09-05 DIAGNOSIS — N838 Other noninflammatory disorders of ovary, fallopian tube and broad ligament: Secondary | ICD-10-CM | POA: Diagnosis not present

## 2022-09-05 DIAGNOSIS — R109 Unspecified abdominal pain: Secondary | ICD-10-CM | POA: Diagnosis not present

## 2022-09-05 DIAGNOSIS — R0789 Other chest pain: Secondary | ICD-10-CM | POA: Diagnosis not present

## 2022-09-05 DIAGNOSIS — Z951 Presence of aortocoronary bypass graft: Secondary | ICD-10-CM | POA: Diagnosis not present

## 2022-09-05 DIAGNOSIS — R1909 Other intra-abdominal and pelvic swelling, mass and lump: Secondary | ICD-10-CM | POA: Diagnosis not present

## 2022-09-05 DIAGNOSIS — E876 Hypokalemia: Secondary | ICD-10-CM | POA: Diagnosis not present

## 2022-09-05 DIAGNOSIS — E278 Other specified disorders of adrenal gland: Secondary | ICD-10-CM | POA: Diagnosis not present

## 2022-09-05 DIAGNOSIS — G8929 Other chronic pain: Secondary | ICD-10-CM | POA: Diagnosis not present

## 2022-09-05 LAB — CBC
HCT: 31.9 % — ABNORMAL LOW (ref 36.0–46.0)
Hemoglobin: 10.9 g/dL — ABNORMAL LOW (ref 12.0–15.0)
MCH: 33.6 pg (ref 26.0–34.0)
MCHC: 34.2 g/dL (ref 30.0–36.0)
MCV: 98.5 fL (ref 80.0–100.0)
Platelets: 354 10*3/uL (ref 150–400)
RBC: 3.24 MIL/uL — ABNORMAL LOW (ref 3.87–5.11)
RDW: 12.7 % (ref 11.5–15.5)
WBC: 12.1 10*3/uL — ABNORMAL HIGH (ref 4.0–10.5)
nRBC: 0 % (ref 0.0–0.2)

## 2022-09-05 LAB — BASIC METABOLIC PANEL
Anion gap: 7 (ref 5–15)
BUN: 11 mg/dL (ref 8–23)
CO2: 26 mmol/L (ref 22–32)
Calcium: 9.3 mg/dL (ref 8.9–10.3)
Chloride: 104 mmol/L (ref 98–111)
Creatinine, Ser: 1.26 mg/dL — ABNORMAL HIGH (ref 0.44–1.00)
GFR, Estimated: 44 mL/min — ABNORMAL LOW (ref >60)
Glucose, Bld: 123 mg/dL — ABNORMAL HIGH (ref 70–99)
Potassium: 3.8 mmol/L (ref 3.5–5.1)
Sodium: 137 mmol/L (ref 135–145)

## 2022-09-05 LAB — ECHOCARDIOGRAM COMPLETE
AR max vel: 2.14 cm2
AV Area VTI: 2.21 cm2
AV Area mean vel: 2.18 cm2
AV Mean grad: 4 mmHg
AV Peak grad: 7.2 mmHg
Ao pk vel: 1.34 m/s
Area-P 1/2: 4.29 cm2
Calc EF: 58.6 %
Height: 67 in
MV VTI: 2.85 cm2
S' Lateral: 3.6 cm
Single Plane A2C EF: 58.4 %
Single Plane A4C EF: 54.4 %
Weight: 3086.44 oz

## 2022-09-05 LAB — LIPID PANEL
Cholesterol: 156 mg/dL (ref 0–200)
HDL: 47 mg/dL (ref >40)
LDL Cholesterol: 87 mg/dL (ref 0–99)
Total CHOL/HDL Ratio: 3.3 RATIO
Triglycerides: 108 mg/dL (ref <150)
VLDL: 22 mg/dL (ref 0–40)

## 2022-09-05 LAB — TROPONIN I (HIGH SENSITIVITY): Troponin I (High Sensitivity): 3 ng/L (ref <18)

## 2022-09-05 LAB — HEPATIC FUNCTION PANEL
ALT: 9 U/L (ref 0–44)
AST: 13 U/L — ABNORMAL LOW (ref 15–41)
Albumin: 3.5 g/dL (ref 3.5–5.0)
Alkaline Phosphatase: 70 U/L (ref 38–126)
Bilirubin, Direct: 0.2 mg/dL (ref 0.0–0.2)
Indirect Bilirubin: 0.4 mg/dL (ref 0.3–0.9)
Total Bilirubin: 0.6 mg/dL (ref 0.3–1.2)
Total Protein: 6.3 g/dL — ABNORMAL LOW (ref 6.5–8.1)

## 2022-09-05 LAB — MAGNESIUM: Magnesium: 2 mg/dL (ref 1.7–2.4)

## 2022-09-05 LAB — MRSA NEXT GEN BY PCR, NASAL: MRSA by PCR Next Gen: NOT DETECTED

## 2022-09-05 LAB — LIPASE, BLOOD: Lipase: 15 U/L (ref 11–51)

## 2022-09-05 MED ORDER — NITROGLYCERIN 0.4 MG SL SUBL: 0.4000 mg | SUBLINGUAL_TABLET | SUBLINGUAL | Status: DC | PRN

## 2022-09-05 MED ORDER — ACETAMINOPHEN 500 MG PO TABS
1000.0000 mg | ORAL_TABLET | Freq: Three times a day (TID) | ORAL | Status: DC
  Administered 2022-09-05 – 2022-09-06 (×2): 1000 mg via ORAL
  Filled 2022-09-05 (×2): qty 2

## 2022-09-05 MED ORDER — CYCLOBENZAPRINE HCL 10 MG PO TABS
5.0000 mg | ORAL_TABLET | Freq: Three times a day (TID) | ORAL | Status: DC | PRN
  Administered 2022-09-05: 5 mg via ORAL
  Filled 2022-09-05: qty 1

## 2022-09-05 MED ORDER — HYDROCHLOROTHIAZIDE 12.5 MG PO TABS
12.5000 mg | ORAL_TABLET | Freq: Every day | ORAL | Status: DC
  Administered 2022-09-05 – 2022-09-06 (×2): 12.5 mg via ORAL
  Filled 2022-09-05 (×3): qty 1

## 2022-09-05 MED ORDER — ACETAMINOPHEN 650 MG RE SUPP: 650.0000 mg | Freq: Four times a day (QID) | RECTAL | Status: DC | PRN

## 2022-09-05 MED ORDER — POTASSIUM CHLORIDE CRYS ER 20 MEQ PO TBCR
40.0000 meq | EXTENDED_RELEASE_TABLET | Freq: Once | ORAL | Status: AC
  Administered 2022-09-05: 40 meq via ORAL
  Filled 2022-09-05: qty 2

## 2022-09-05 MED ORDER — ATORVASTATIN CALCIUM 10 MG PO TABS
20.0000 mg | ORAL_TABLET | Freq: Every day | ORAL | Status: DC
  Administered 2022-09-05: 20 mg via ORAL
  Filled 2022-09-05: qty 2

## 2022-09-05 MED ORDER — PANTOPRAZOLE SODIUM 40 MG PO TBEC
40.0000 mg | DELAYED_RELEASE_TABLET | Freq: Every day | ORAL | Status: DC
  Administered 2022-09-05: 40 mg via ORAL
  Filled 2022-09-05: qty 1

## 2022-09-05 MED ORDER — ACETAMINOPHEN 325 MG PO TABS
650.0000 mg | ORAL_TABLET | Freq: Four times a day (QID) | ORAL | Status: DC | PRN
  Administered 2022-09-05: 650 mg via ORAL
  Filled 2022-09-05: qty 2

## 2022-09-05 MED ORDER — MORPHINE SULFATE (PF) 2 MG/ML IV SOLN
2.0000 mg | INTRAVENOUS | Status: DC | PRN
  Administered 2022-09-05 (×2): 2 mg via INTRAVENOUS
  Filled 2022-09-05 (×2): qty 1

## 2022-09-05 MED ORDER — KETOROLAC TROMETHAMINE 15 MG/ML IJ SOLN
15.0000 mg | Freq: Three times a day (TID) | INTRAMUSCULAR | Status: DC | PRN
  Administered 2022-09-05: 15 mg via INTRAVENOUS
  Filled 2022-09-05: qty 1

## 2022-09-05 MED ORDER — LIDOCAINE 5 % EX PTCH
1.0000 | MEDICATED_PATCH | CUTANEOUS | Status: DC
  Administered 2022-09-05 – 2022-09-06 (×2): 1 via TRANSDERMAL
  Filled 2022-09-05 (×2): qty 1

## 2022-09-05 MED ORDER — OXYCODONE HCL 5 MG PO TABS
5.0000 mg | ORAL_TABLET | Freq: Four times a day (QID) | ORAL | Status: DC | PRN
  Administered 2022-09-05: 10 mg via ORAL
  Filled 2022-09-05: qty 2

## 2022-09-05 MED ORDER — NITROGLYCERIN 0.4 MG SL SUBL
0.4000 mg | SUBLINGUAL_TABLET | Freq: Once | SUBLINGUAL | Status: AC
  Administered 2022-09-05: 0.4 mg via SUBLINGUAL
  Filled 2022-09-05: qty 1

## 2022-09-05 MED ORDER — HYDROCODONE-ACETAMINOPHEN 5-325 MG PO TABS: 1.0000 | ORAL_TABLET | ORAL | Status: DC | PRN

## 2022-09-05 MED ORDER — ENOXAPARIN SODIUM 40 MG/0.4ML IJ SOSY
40.0000 mg | PREFILLED_SYRINGE | INTRAMUSCULAR | Status: DC
  Administered 2022-09-05 – 2022-09-06 (×2): 40 mg via SUBCUTANEOUS
  Filled 2022-09-05 (×2): qty 0.4

## 2022-09-05 MED ORDER — DULOXETINE HCL 60 MG PO CPEP
60.0000 mg | ORAL_CAPSULE | Freq: Every day | ORAL | Status: DC
  Administered 2022-09-05 – 2022-09-06 (×2): 60 mg via ORAL
  Filled 2022-09-05 (×2): qty 1

## 2022-09-05 MED ORDER — POLYVINYL ALCOHOL 1.4 % OP SOLN
1.0000 [drp] | Freq: Every day | OPHTHALMIC | Status: DC | PRN
  Filled 2022-09-05: qty 15

## 2022-09-05 MED ORDER — PREGABALIN 25 MG PO CAPS
25.0000 mg | ORAL_CAPSULE | Freq: Every day | ORAL | Status: DC
  Administered 2022-09-05 – 2022-09-06 (×2): 25 mg via ORAL
  Filled 2022-09-05 (×2): qty 1

## 2022-09-05 MED ORDER — METOPROLOL TARTRATE 25 MG PO TABS
25.0000 mg | ORAL_TABLET | Freq: Two times a day (BID) | ORAL | Status: DC
  Administered 2022-09-05 – 2022-09-06 (×3): 25 mg via ORAL
  Filled 2022-09-05 (×3): qty 1

## 2022-09-05 MED ORDER — KETOROLAC TROMETHAMINE 30 MG/ML IJ SOLN: 30.0000 mg | Freq: Three times a day (TID) | INTRAMUSCULAR | Status: DC | PRN

## 2022-09-05 MED ORDER — DORZOLAMIDE HCL-TIMOLOL MAL 2-0.5 % OP SOLN
1.0000 [drp] | Freq: Two times a day (BID) | OPHTHALMIC | Status: DC
  Administered 2022-09-05 – 2022-09-06 (×3): 1 [drp] via OPHTHALMIC
  Filled 2022-09-05 (×2): qty 10

## 2022-09-05 MED ORDER — BUPROPION HCL ER (XL) 150 MG PO TB24
150.0000 mg | ORAL_TABLET | Freq: Every day | ORAL | Status: DC
  Administered 2022-09-05 – 2022-09-06 (×2): 150 mg via ORAL
  Filled 2022-09-05 (×2): qty 1

## 2022-09-05 MED ORDER — HYDRALAZINE HCL 20 MG/ML IJ SOLN
10.0000 mg | Freq: Four times a day (QID) | INTRAMUSCULAR | Status: DC | PRN
  Administered 2022-09-05: 10 mg via INTRAVENOUS
  Filled 2022-09-05: qty 1

## 2022-09-05 NOTE — H&P (Signed)
History and Physical    Meredith Cole KDT:[id number] DOB: Feb 19, 1946 DOA: 09/04/2022  PCP: London Pepper, MD  Patient coming from: [place] [facility]  Chief Complaint: Chest pain  HPI: Meredith Cole is a 76 y.o. female with medical history significant of hypertension, hyperlipidemia, melanoma, left breast lumpectomy with radioactive seed localization 06/2022, depression/anxiety, lumbar spinal stenosis status post L4-S1 PLIF on 08/27/2022 at [place] presented to the ED with sudden onset severe left-sided chest pain.  Vital signs on arrival to the ED: Temperature 97.9 F, pulse 93, respiratory rate 22, blood pressure 174/83, SPO2 100% on room air.  Labs showing WBC 11.7, hemoglobin 12.5, platelet count 447k, potassium 3.4, creatinine 1.1 (stable), troponin negative x2, no elevation of lipase or LFTs.  CTA negative for aortic dissection or PE.  Patient was given aspirin 324 mg, Toradol, morphine, sublingual nitroglycerin, and Zofran.  Patient states she had back surgery done a week ago.  Yesterday around 5:30 PM while sitting she experienced sudden onset, 10 out of 10 intensity, sharp left-sided lower chest/upper abdominal/substernal pain which radiated to her back and her left arm.  States pain has now significantly improved after she received pain medications in the ED but not completely resolved.  She is no longer having chest pain at this time but still having some pain in her left upper back.  She denies history of CAD or any anginal symptoms in the past.  Denies nausea, vomiting, or GERD symptoms.  No other complaints.  Review of Systems:  Review of Systems  All other systems reviewed and are negative.   Past Medical History:  Diagnosis Date   Allergy    Anxiety    hx of   Arthritis    back   Glaucoma    uses drops   Hyperlipidemia    on meds   Hypertension    on meds   Melanoma ([place]) 2016   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    Trichuriasis - whipworm 2009   Incidental at colonoscopy - treated with mebendazole   Vertigo     Past Surgical History:  Procedure Laterality Date   ANTERIOR CERVICAL DECOMP/DISCECTOMY FUSION N/A 06/13/2019   Procedure: ANTERIOR CERVICAL DECOMPRESSION/DISCECTOMY FUSION C6-7;  Surgeon: Melina Schools, MD;  Location: [place];  Service: Orthopedics;  Laterality: N/A;  3 hr   ARM WOUND REPAIR / CLOSURE     WRIST  BROKE WAS REPAIRED   bone plate and graft left upper arm     BREAST LUMPECTOMY WITH RADIOACTIVE SEED LOCALIZATION Left 06/28/2022   Procedure: LEFT BREAST LUMPECTOMY WITH RADIOACTIVE SEED LOCALIZATION;  Surgeon: Jovita Kussmaul, MD;  Location: [place];  Service: General;  Laterality: Left;   CATARACT EXTRACTION W/ INTRAOCULAR LENS  IMPLANT, BILATERAL     GLAUCOMA  PROC   CESAREAN SECTION     CHOLECYSTECTOMY     COLON SURGERY  01/2008   Dr. Zettie Pho; right hemi-colectomy for tubulovillous adenoma   COLONOSCOPY  2002, 2008, 2009 and 06/28/2011   2002: 21m polyp, 2008-2009: large ascending tubulovillous adenoma. 2012: diverticulosis, external hemorrhoids   COLONOSCOPY  2017   CG-prep exc-TA   LUMBAR LAMINECTOMY/DECOMPRESSION MICRODISCECTOMY N/A 01/05/2018   Procedure: Decompression Lumbar 4-Lumbar 5 ;  Surgeon: BMelina Schools MD;  Location: [place]  Service: Orthopedics;  Laterality: N/A;  120 mins   MELANOMA EXCISION     face   NM RENAL LASIX (ARabunHX)     POLYPECTOMY  2017  TA   SPINAL FUSION  08/27/2022   TONSILLECTOMY     WISDOM TOOTH EXTRACTION       reports that she quit smoking about 50 years ago. Her smoking use included cigarettes. She has a 3.00 pack-year smoking history. She has never used smokeless tobacco. She reports current alcohol use of about 2.0 standard drinks of alcohol per week. She reports that she does not use drugs.  Allergies  Allergen Reactions   Lisinopril Other (See Comments)    COUGH    Family History  Problem Relation Age of  Onset   Colon polyps Mother 110   Colon cancer Mother 31       died at age 21   Heart disease Father    Esophageal cancer Neg Hx    Pancreatic cancer Neg Hx    Rectal cancer Neg Hx    Stomach cancer Neg Hx     Prior to Admission medications   Medication Sig Start Date End Date Taking? Authorizing Provider  acetaminophen (TYLENOL) 500 MG tablet Take 1 tablet by mouth every 4 (four) hours as needed.    [provider]  atorvastatin (LIPITOR) 20 MG tablet Take 20 mg by mouth at bedtime.     [provider]  brimonidine-timolol (COMBIGAN) 0.2-0.5 % ophthalmic solution Place 1 drop into both eyes 2 (two) times daily.    [provider]  buPROPion (WELLBUTRIN XL) 150 MG 24 hr tablet Take 150 mg by mouth daily.    [provider]  cycloSPORINE (RESTASIS) 0.05 % ophthalmic emulsion Place 1 drop into both eyes 2 (two) times daily.    [provider]  dorzolamide-timolol (COSOPT) 22.3-6.8 MG/ML ophthalmic solution Place 1 drop into both eyes daily. 12/09/21   [provider]  DULoxetine (CYMBALTA) 60 MG capsule Take 60 mg by mouth daily.    [provider]  estradiol (ESTRACE) 0.1 MG/GM vaginal cream 0.5 gm 01/24/21   [provider]  fexofenadine (ALLEGRA) 180 MG tablet Take 180 mg by mouth daily.      [provider]  hydrochlorothiazide (MICROZIDE) 12.5 MG capsule Take 12.5 mg by mouth daily.    [provider]  Latanoprostene Bunod (VYZULTA) 0.024 % SOLN Vyzulta 0.024 % eye drops  INSTILL 1 DROP IN BOTH EYES EVERY DAY IN THE EVENING Patient not taking: Reported on 01/13/2022    [provider]  meclizine (ANTIVERT) 25 MG tablet Take 25 mg by mouth 3 (three) times daily as needed for dizziness. Patient not taking: Reported on 01/13/2022    [provider]  Melatonin 10 MG TABS Take 1 tablet by mouth at bedtime.    [provider]  Multiple Vitamin (MULTIVITAMINS PO) Take 1 tablet by  mouth daily.    [provider]  oxyCODONE (ROXICODONE) 5 MG immediate release tablet Take 1 tablet (5 mg total) by mouth every 6 (six) hours as needed for severe pain. 06/28/22   Autumn Messing III, MD  tretinoin (RETIN-A) 0.025 % cream Apply 1 application topically daily.    [provider]    Physical Exam: Vitals:   09/05/22 0030 09/05/22 0100 09/05/22 0209 09/05/22 0217  BP: (!) 150/64 (!) 153/68  (!) 149/63  Pulse: 81 72  82  Resp: '14 16  14  '$ Temp:  98.1 F (36.7 C)    TempSrc:      SpO2: 98% 98%  99%  Weight:   87.5 kg   Height:   '5\' 7"'$  (1.702 m)  Physical Exam Vitals reviewed.  Constitutional:      General: She is not in acute distress. HENT:     Head: Normocephalic and atraumatic.  Eyes:     Extraocular Movements: Extraocular movements intact.  Cardiovascular:     Rate and Rhythm: Normal rate and regular rhythm.     Pulses: Normal pulses.  Pulmonary:     Effort: Pulmonary effort is normal. No respiratory distress.     Breath sounds: Normal breath sounds. No wheezing or rales.  Abdominal:     General: Bowel sounds are normal. There is no distension.     Palpations: Abdomen is soft.     Tenderness: There is no abdominal tenderness.  Musculoskeletal:        General: No swelling or tenderness.     Cervical back: Normal range of motion.  Skin:    General: Skin is warm and dry.  Neurological:     General: No focal deficit present.     Mental Status: She is alert and oriented to person, place, and time.     Labs on Admission: I have personally reviewed following labs and imaging studies  CBC: Recent Labs  Lab 09/04/22 2117  WBC 11.7*  HGB 12.5  HCT 36.8  MCV 98.7  PLT 188*   Basic Metabolic Panel: Recent Labs  Lab 09/04/22 2117  NA 140  K 3.4*  CL 101  CO2 23  GLUCOSE 138*  BUN 14  CREATININE 1.13*  CALCIUM 9.8   GFR: Estimated Creatinine Clearance: 47.4 mL/min (A) (by C-G formula based on SCr of 1.13 mg/dL (H)). Liver  Function Tests: Recent Labs  Lab 09/04/22 2343  AST 13*  ALT 9  ALKPHOS 70  BILITOT 0.6  PROT 6.3*  ALBUMIN 3.5   Recent Labs  Lab 09/04/22 2343  LIPASE 15   No results for input(s): "AMMONIA" in the last 168 hours. Coagulation Profile: No results for input(s): "INR", "PROTIME" in the last 168 hours. Cardiac Enzymes: No results for input(s): "CKTOTAL", "CKMB", "CKMBINDEX", "TROPONINI" in the last 168 hours. BNP (last 3 results) No results for input(s): "PROBNP" in the last 8760 hours. HbA1C: No results for input(s): "HGBA1C" in the last 72 hours. CBG: No results for input(s): "GLUCAP" in the last 168 hours. Lipid Profile: No results for input(s): "CHOL", "HDL", "LDLCALC", "TRIG", "CHOLHDL", "LDLDIRECT" in the last 72 hours. Thyroid Function Tests: No results for input(s): "TSH", "T4TOTAL", "FREET4", "T3FREE", "THYROIDAB" in the last 72 hours. Anemia Panel: No results for input(s): "VITAMINB12", "FOLATE", "FERRITIN", "TIBC", "IRON", "RETICCTPCT" in the last 72 hours. Urine analysis:    Component Value Date/Time   COLORURINE YELLOW 06/06/2019 1051   APPEARANCEUR CLEAR 06/06/2019 1051   LABSPEC 1.013 06/06/2019 1051   PHURINE 6.0 06/06/2019 1051   GLUCOSEU NEGATIVE 06/06/2019 1051   HGBUR NEGATIVE 06/06/2019 1051   BILIRUBINUR NEGATIVE 06/06/2019 1051   [place] 06/06/2019 1051   PROTEINUR NEGATIVE 06/06/2019 1051   NITRITE NEGATIVE 06/06/2019 1051   LEUKOCYTESUR MODERATE (A) 06/06/2019 1051    Radiological Exams on Admission: CT Angio Chest/Abd/Pel for Dissection W and/or Wo Contrast  Result Date: 09/04/2022 CLINICAL DATA:  Chest and abdominal pain EXAM: CT ANGIOGRAPHY CHEST, ABDOMEN AND PELVIS TECHNIQUE: Non-contrast CT of the chest was initially obtained. Multidetector CT imaging through the chest, abdomen and pelvis was performed using the standard protocol during bolus administration of intravenous contrast. Multiplanar reconstructed images and MIPs  were obtained and reviewed to evaluate the vascular anatomy. RADIATION DOSE REDUCTION: This exam was  performed according to the departmental dose-optimization program which includes automated exposure control, adjustment of the mA and/or kV according to patient size and/or use of iterative reconstruction technique. CONTRAST:  189m OMNIPAQUE IOHEXOL 350 MG/ML SOLN COMPARISON:  04/23/2022 FINDINGS: CTA CHEST FINDINGS Cardiovascular: Initial precontrast images demonstrate atherosclerotic calcification. No hyperdense crescent to suggest acute aortic injury is noted. Post-contrast images demonstrate similar aortic atherosclerotic changes without aneurysmal dilatation or dissection. Heart is not significantly enlarged in size. Mild coronary calcifications are seen. Pulmonary artery shows no evidence of pulmonary emboli although not specifically timed for embolus evaluation. Mediastinum/Nodes: Thoracic inlet is within normal limits. Scattered small mediastinal nodes are seen although not significant by size criteria. These may be reactive in nature. The esophagus is within normal limits. Lungs/Pleura: Lungs are well aerated bilaterally. No focal infiltrate or sizable effusion is seen. No parenchymal nodules are seen. No pneumothorax is noted. Musculoskeletal: No acute rib abnormality is noted. Mild degenerative changes of the thoracic spine are seen. Postsurgical changes are noted in the lower cervical spine. No compression deformity is noted. Review of the MIP images confirms the above findings. CTA ABDOMEN AND PELVIS FINDINGS VASCULAR Aorta: Normal caliber aorta without aneurysm, dissection, vasculitis or significant stenosis. Celiac: Patent without evidence of aneurysm, dissection, vasculitis or significant stenosis. SMA: Patent without evidence of aneurysm, dissection, vasculitis or significant stenosis. Renals: Both renal arteries are patent without evidence of aneurysm, dissection, vasculitis, fibromuscular  dysplasia or significant stenosis. IMA: Patent without evidence of aneurysm, dissection, vasculitis or significant stenosis. Inflow: Iliacs demonstrate mild atherosclerotic calcifications. No aneurysmal dilatation or dissection is seen. Veins: No specific venous abnormality is noted. Review of the MIP images confirms the above findings. NON-VASCULAR Hepatobiliary: Fatty infiltration of the liver is noted. The gallbladder has been surgically removed. Pancreas: Unremarkable. No pancreatic ductal dilatation or surrounding inflammatory changes. Spleen: Normal in size without focal abnormality. Adrenals/Urinary Tract: Adrenal glands are within normal limits. Kidneys demonstrate a normal enhancement pattern. No renal calculi are seen. Large lobular renal cyst is noted on the left measuring 6.1 cm stable in appearance from a prior CT examination. Given its stability in simple appearing nature. No further follow-up is recommended. No obstructive changes are seen. Bladder is decompressed. Stomach/Bowel: Very mild diverticular change of the colon is noted. No obstructive or inflammatory changes of the colon are seen. Changes of right hemicolectomy are noted. The anastomosis appears widely patent. The appendix is not visualized likely related to the prior surgery. Small bowel and stomach are unremarkable. Lymphatic: No lymphadenopathy is identified. Reproductive: Uterus and right adnexa are unremarkable. Left adnexa demonstrates a 3.4 cm lesion. This has slightly decreased attenuation although is not cystic in nature. Patient is postmenopausal. Other: No abdominal wall hernia or abnormality. No abdominopelvic ascites. Musculoskeletal: Postsurgical changes in the lower lumbar spine are noted. No acute bony abnormality is seen. Review of the MIP images confirms the above findings. IMPRESSION: No evidence of aortic dissection or aneurysmal dilatation. No evidence of pulmonary emboli. Diverticulosis without diverticulitis. 3.4 cm  left adnexal lesion in a postmenopausal patient. Given the postmenopausal state prompt follow-up ultrasound is recommended. Reference: JACR 2020 Feb;17(2):248-254 Electronically Signed   By: MInez CatalinaM.D.   On: 09/04/2022 22:57    EKG: Independently reviewed.  Sinus rhythm, artifact.  Assessment and Plan  Chest/back pain Work-up done in the ED not suggestive of aortic dissection, PE, or ACS.  Does have risk factors for CAD but denies history of prior anginal symptoms.  ?Musculoskeletal component in the setting  of recent back surgery.  Chest pain has now resolved but still having left-sided upper back pain. -Cardiac monitoring -Echocardiogram -Pain management: Lidocaine patch, Toradol as needed, Tylenol as needed  Left adnexal lesion CT showing a 3.4 cm left adnexal lesion in this postmenopausal patient. -Discussed with the patient and pelvic ultrasound ordered for further evaluation.  Borderline leukocytosis Likely reactive.  No fever or signs of infection. -Repeat CBC in a.m.  Mild hypokalemia -Replace potassium and continue to monitor -Check magnesium level  Hypertension: Blood pressure improved with systolic currently in the 140s. Hyperlipidemia Depression/anxiety -Continue home meds after pharmacy med rec is done.  DVT prophylaxis: Lovenox Code Status: Full Code (discussed with the patient) Family Communication: No family available at this time. Level of care: Progressive Care Unit Admission status: It is my clinical opinion that referral for OBSERVATION is reasonable and necessary in this patient based on the above information provided. The aforementioned taken together are felt to place the patient at high risk for further clinical deterioration. However, it is anticipated that the patient may be medically stable for discharge from the hospital within 24 to 48 hours.   Shela Leff MD [facility]  If 7PM-7AM, please contact  night-coverage [email or web address]  09/05/2022, 3:26 AM

## 2022-09-05 NOTE — Progress Notes (Signed)
Pain now localized to her upper back area BP remains elevated She doesn't feel comfortable going home Will add lyrica, try to control pain with oral narcotics before consideration of discharge. She has been getting mostly IV Morphine/Toradol. If pain continues may need more imaging Echo with stable EF.

## 2022-09-05 NOTE — Progress Notes (Signed)
  Echocardiogram 2D Echocardiogram has been performed.  Meredith Cole 09/05/2022, 2:23 PM

## 2022-09-05 NOTE — Consult Note (Signed)
CARDIOLOGY CONSULT NOTE  Patient ID: Meredith Cole MRN: [id number] DOB/AGE: 1946/08/01 76 y.o.  Admit date: 09/04/2022 Referring Physician: [facility] Reason for Consultation:  Chest pain  HPI:   76 y.o. Caucasian female  with hypertension, hyperlipidemia, recent lumbar fusion, admitted with chest/back/left lower abdominal pain  Patient underwent lumbar fusion surgery at [facility] 10 days ago, and was recovering at home.  On 09/04/2022 evening, she had severe left lower abdominal pain that radiated to her chest and back.  Pain was "20/10" at its worst.  Left lower abdominal and chest pain lasted for about 1 hour and resolved with pain medications in the ER.  Constant back pain still persists.  Cardiac work-up showed no acute ischemic changes on EKG, high sensitive troponin negative x2, no dissection on CTA, mild aorta and coronary calcification.  Echocardiogram pending.  Incidental finding of left adnexal mass on CT scan, further corroborated by ultrasound.  Past Medical History:  Diagnosis Date   Allergy    Anxiety    hx of   Arthritis    back   Glaucoma    uses drops   Hyperlipidemia    on meds   Hypertension    on meds   Melanoma ([place]) 2016   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   Trichuriasis - whipworm 2009   Incidental at colonoscopy - treated with mebendazole   Vertigo      Past Surgical History:  Procedure Laterality Date   ANTERIOR CERVICAL DECOMP/DISCECTOMY FUSION N/A 06/13/2019   Procedure: ANTERIOR CERVICAL DECOMPRESSION/DISCECTOMY FUSION C6-7;  Surgeon: Melina Schools, MD;  Location: [place];  Service: Orthopedics;  Laterality: N/A;  3 hr   ARM WOUND REPAIR / CLOSURE     WRIST  BROKE WAS REPAIRED   bone plate and graft left upper arm     BREAST LUMPECTOMY WITH RADIOACTIVE SEED LOCALIZATION Left 06/28/2022   Procedure: LEFT BREAST LUMPECTOMY WITH RADIOACTIVE SEED LOCALIZATION;  Surgeon: Jovita Kussmaul, MD;  Location: [place];  Service: General;  Laterality: Left;   CATARACT EXTRACTION W/ INTRAOCULAR LENS  IMPLANT, BILATERAL     GLAUCOMA  PROC   CESAREAN SECTION     CHOLECYSTECTOMY     COLON SURGERY  01/2008   Dr. Zettie Pho; right hemi-colectomy for tubulovillous adenoma   COLONOSCOPY  2002, 2008, 2009 and 06/28/2011   2002: 54m polyp, 2008-2009: large ascending tubulovillous adenoma. 2012: diverticulosis, external hemorrhoids   COLONOSCOPY  2017   CG-prep exc-TA   LUMBAR LAMINECTOMY/DECOMPRESSION MICRODISCECTOMY N/A 01/05/2018   Procedure: Decompression Lumbar 4-Lumbar 5 ;  Surgeon: BMelina Schools MD;  [place]  Service: Orthopedics;  Laterality: N/A;  120 mins   MELANOMA EXCISION     face   NM RENAL LASIX (A[place]HX)     POLYPECTOMY  2017   TA   SPINAL FUSION  08/27/2022   TONSILLECTOMY     WISDOM TOOTH EXTRACTION        Family History  Problem Relation Age of Onset   Colon polyps Mother 488  Colon cancer Mother 487      died at age 77  Heart disease Father    Esophageal cancer Neg Hx    Pancreatic cancer Neg Hx    Rectal cancer Neg Hx    Stomach cancer Neg Hx      Social History: Social History   Socioeconomic History   Marital status: Married    Spouse name: Not on file  Number of children: Not on file   Years of education: Not on file   Highest education level: Not on file  Occupational History   Not on file  Tobacco Use   Smoking status: Former    Packs/day: 1.00    Years: 3.00    Total pack years: 3.00    Types: Cigarettes    Quit date: 09/24/1971    Years since quitting: 50.9   Smokeless tobacco: Never  Vaping Use   Vaping Use: Never used  Substance and Sexual Activity   Alcohol use: Yes    Alcohol/week: 2.0 standard drinks of alcohol    Types: 2 Glasses of wine per week    Comment: occ   Drug use: No   Sexual activity: Not Currently  Other Topics Concern   Not on file  Social History Narrative   Not on file   Social Determinants of Health    Financial Resource Strain: Not on file  Food Insecurity: No Food Insecurity (09/05/2022)   Hunger Vital Sign    Worried About Running Out of Food in the Last Year: Never true    Ran Out of Food in the Last Year: Never true  Transportation Needs: No Transportation Needs (09/05/2022)   PRAPARE - Hydrologist (Medical): No    Lack of Transportation (Non-Medical): No  Physical Activity: Not on file  Stress: Not on file  Social Connections: Not on file  Intimate Partner Violence: Not At Risk (09/05/2022)   Humiliation, Afraid, Rape, and Kick questionnaire    Fear of Current or Ex-Partner: No    Emotionally Abused: No    Physically Abused: No    Sexually Abused: No     Medications Prior to Admission  Medication Sig Dispense Refill Last Dose   acetaminophen (TYLENOL) 500 MG tablet Take 1 tablet by mouth every 4 (four) hours as needed.      atorvastatin (LIPITOR) 20 MG tablet Take 20 mg by mouth at bedtime.       brimonidine-timolol (COMBIGAN) 0.2-0.5 % ophthalmic solution Place 1 drop into both eyes 2 (two) times daily.      buPROPion (WELLBUTRIN XL) 150 MG 24 hr tablet Take 150 mg by mouth daily.      cycloSPORINE (RESTASIS) 0.05 % ophthalmic emulsion Place 1 drop into both eyes 2 (two) times daily.      dorzolamide-timolol (COSOPT) 22.3-6.8 MG/ML ophthalmic solution Place 1 drop into both eyes daily.      DULoxetine (CYMBALTA) 60 MG capsule Take 60 mg by mouth daily.      estradiol (ESTRACE) 0.1 MG/GM vaginal cream 0.5 gm      fexofenadine (ALLEGRA) 180 MG tablet Take 180 mg by mouth daily.        hydrochlorothiazide (MICROZIDE) 12.5 MG capsule Take 12.5 mg by mouth daily.      Latanoprostene Bunod (VYZULTA) 0.024 % SOLN Vyzulta 0.024 % eye drops  INSTILL 1 DROP IN BOTH EYES EVERY DAY IN THE EVENING (Patient not taking: Reported on 01/13/2022)      meclizine (ANTIVERT) 25 MG tablet Take 25 mg by mouth 3 (three) times daily as needed for dizziness. (Patient not  taking: Reported on 01/13/2022)      Melatonin 10 MG TABS Take 1 tablet by mouth at bedtime.      Multiple Vitamin (MULTIVITAMINS PO) Take 1 tablet by mouth daily.      oxyCODONE (ROXICODONE) 5 MG immediate release tablet Take 1 tablet (5 mg total) by  mouth every 6 (six) hours as needed for severe pain. 10 tablet 0    tretinoin (RETIN-A) 0.025 % cream Apply 1 application topically daily.       Review of Systems  Cardiovascular:  Positive for chest pain. Negative for dyspnea on exertion, leg swelling, palpitations and syncope.  Musculoskeletal:  Positive for back pain.  Gastrointestinal:  Positive for abdominal pain.      Physical Exam: Physical Exam Vitals and nursing note reviewed.  Constitutional:      General: She is not in acute distress. Neck:     Vascular: No JVD.  Cardiovascular:     Rate and Rhythm: Normal rate and regular rhythm.     Heart sounds: Normal heart sounds. No murmur heard. Pulmonary:     Effort: Pulmonary effort is normal.     Breath sounds: Normal breath sounds. No wheezing or rales.  Musculoskeletal:     Right lower leg: No edema.     Left lower leg: No edema.        Imaging/tests reviewed and independently interpreted: Lab Results: CBC, BMP, trop HS  Cardiac Studies:  Telemetry 09/04/2022: No significant arrhythmia  EKG 09/04/2022: Sinus rhythm Nonspecific ST-T abnormality  Echocardiogram: Pending  CTA C/A/P 09/04/2022: No evidence of aortic dissection or aneurysmal dilatation. No evidence of pulmonary emboli. Diverticulosis without diverticulitis. 3.4 cm left adnexal lesion in a postmenopausal patient. Given the postmenopausal state prompt follow-up ultrasound is recommended. Reference: JACR 2020 Feb;17(2):248-254 Aorta and coronary mild calcification.   Assessment & Recommendations:  76 y.o. Caucasian female  with hypertension, hyperlipidemia, recent lumbar fusion, admitted with chest/back/left lower abdominal  pain  Chest/back/lower abdominal pain: ACS, aortic dissection ruled out.  Very low suspicion for PE. Incidental finding of aorta and coronary mild calcification, unlikely to the cause of patient's pain. Incidental finding of left adnexal mass, unsure if it is related to her pain. From cardiac standpoint, agree with echocardiogram.  Do not recommend any other inpatient testing at this time. Check lipid panel, continue Lipitor. Blood pressure elevated today, likely due to pain.  Much better controlled at home.  I will see her on as-needed basis.  Discussed interpretation of tests and management recommendations with the primary team     Nigel Mormon, MD Pager: [phone] Office: [phone]

## 2022-09-05 NOTE — Care Management Obs Status (Signed)
[facility] NOTIFICATION   Patient Details  Name: Meredith Cole MRN: [id number] Date of Birth: May 20, 1946   Medicare Observation Status Notification Given:  Yes    Rogerio Boutelle G., RN 09/05/2022, 9:28 AM

## 2022-09-05 NOTE — Progress Notes (Signed)
PROGRESS NOTE        PATIENT DETAILS Name: Meredith Cole Age: 76 y.o. Sex: female Date of Birth: February 08, 1946 Admit Date: 09/04/2022 Admitting Physician No admitting provider for patient encounter. CBJ:SEGBTD, Marjory Lies, MD  Brief Summary: Patient is a 76 y.o.  female with history of HTN, HLD, left breast lumpectomy with radioactive seed localization July 2023, recent low back surgery for spinal stenosis on 08/27/2022-presented with sudden onset of left-sided flank/chest pain-radiating to the center of the chest and to her left upper extremity.  CTA chest was negative for PE/aortic dissection-she was subsequently transferred to the [facility] service for further evaluation and treatment.  Significant events: 9/30>> admit to [facility] for evaluation of chest pain  Significant studies: 9/30>> CTA chest/abdomen/pelvis: No PE/dissection.  3.4 cm left adnexal lesion. 10/01>> pelvic ultrasound: Solid appearing structure in the left adnexa  Significant microbiology data: None  Procedures: None  Consults: Cardiology  Subjective: Continues to have intermittent chest pain-however less severe than on initial presentation.  Objective: Vitals: Blood pressure (!) 154/61, pulse 82, temperature 97.9 F (36.6 C), temperature source Oral, resp. rate 14, height '5\' 7"'$  (1.702 m), weight 87.5 kg, SpO2 97 %.   Exam: Gen Exam:Alert awake-not in any distress HEENT:atraumatic, normocephalic Chest: B/L clear to auscultation anteriorly CVS:S1S2 regular Abdomen:soft non tender, non distended Extremities:no edema Neurology: Non focal Skin: no rash  Pertinent Labs/Radiology:    Latest Ref Rng & Units 09/05/2022    5:52 AM 09/04/2022    9:17 PM 06/06/2019   10:51 AM  CBC  WBC 4.0 - 10.5 K/uL 12.1  11.7  6.8   Hemoglobin 12.0 - 15.0 g/dL 10.9  12.5  13.3   Hematocrit 36.0 - 46.0 % 31.9  36.8  40.0   Platelets 150 - 400 K/uL 354  447  302     Lab Results  Component Value Date   NA  137 09/05/2022   K 3.8 09/05/2022   CL 104 09/05/2022   CO2 26 09/05/2022      Assessment/Plan: Chest pain: With both typical/atypical features-has numerous risk factors (HTN, HLD, strong family history-Sister had CABG in her early 64s).  Thankfully EKG/troponin/CTA chest negative-have consulted cardiology for further assistance.  Continue aspirin/statin-add beta-blocker.  Continue as needed Toradol/morphine/nitroglycerin.  HLD: Continue statin  HTN: Start beta-blocker-follow/optimize  Chronic back pain/lumbar spinal stenosis: S/p recent surgery on 9/23 at [facility] needed narcotics.  Left adnexal lesion: Incidental finding-stable for further work-up to be done in the outpatient setting.  Depression/anxiety: Resume home meds once medication reconciliation has been completed.  Obesity: Estimated body mass index is 30.21 kg/m as calculated from the following:   Height as of this encounter: '5\' 7"'$  (1.702 m).   Weight as of this encounter: 87.5 kg.   Code status:   Code Status: Full Code   DVT Prophylaxis: enoxaparin (LOVENOX) injection 40 mg Start: 09/05/22 0445   Family Communication: None at bedside   Disposition Plan: Status is: Observation The patient will require care spanning > 2 midnights and should be moved to inpatient because: Continues to have chest pain-cardiology evaluation in progress-awaiting echo.  Not yet stable for discharge.   Planned Discharge Destination:Home when work-up is complete.   Diet: Diet Order             Diet Heart Room service appropriate? Yes;  Fluid consistency: Thin  Diet effective now                     Antimicrobial agents: Anti-infectives (From admission, onward)    None        MEDICATIONS: Scheduled Meds:  atorvastatin  20 mg Oral QHS   enoxaparin (LOVENOX) injection  40 mg Subcutaneous Q24H   lidocaine  1 patch Transdermal Q24H   metoprolol tartrate  25 mg Oral  BID   pantoprazole  40 mg Oral Q1200   Continuous Infusions: PRN Meds:.acetaminophen **OR** acetaminophen, ketorolac, morphine injection, nitroGLYCERIN   I have personally reviewed following labs and imaging studies  LABORATORY DATA: CBC: Recent Labs  Lab 09/04/22 2117 09/05/22 0552  WBC 11.7* 12.1*  HGB 12.5 10.9*  HCT 36.8 31.9*  MCV 98.7 98.5  PLT 447* 818    Basic Metabolic Panel: Recent Labs  Lab 09/04/22 2117 09/05/22 0552  NA 140 137  K 3.4* 3.8  CL 101 104  CO2 23 26  GLUCOSE 138* 123*  BUN 14 11  CREATININE 1.13* 1.26*  CALCIUM 9.8 9.3  MG  --  2.0    GFR: Estimated Creatinine Clearance: 42.5 mL/min (A) (by C-G formula based on SCr of 1.26 mg/dL (H)).  Liver Function Tests: Recent Labs  Lab 09/04/22 2343  AST 13*  ALT 9  ALKPHOS 70  BILITOT 0.6  PROT 6.3*  ALBUMIN 3.5   Recent Labs  Lab 09/04/22 2343  LIPASE 15   No results for input(s): "AMMONIA" in the last 168 hours.  Coagulation Profile: No results for input(s): "INR", "PROTIME" in the last 168 hours.  Cardiac Enzymes: No results for input(s): "CKTOTAL", "CKMB", "CKMBINDEX", "TROPONINI" in the last 168 hours.  BNP (last 3 results) No results for input(s): "PROBNP" in the last 8760 hours.  Lipid Profile: No results for input(s): "CHOL", "HDL", "LDLCALC", "TRIG", "CHOLHDL", "LDLDIRECT" in the last 72 hours.  Thyroid Function Tests: No results for input(s): "TSH", "T4TOTAL", "FREET4", "T3FREE", "THYROIDAB" in the last 72 hours.  Anemia Panel: No results for input(s): "VITAMINB12", "FOLATE", "FERRITIN", "TIBC", "IRON", "RETICCTPCT" in the last 72 hours.  Urine analysis:    Component Value Date/Time   COLORURINE YELLOW 06/06/2019 1051   APPEARANCEUR CLEAR 06/06/2019 1051   LABSPEC 1.013 06/06/2019 1051   PHURINE 6.0 06/06/2019 1051   GLUCOSEU NEGATIVE 06/06/2019 1051   HGBUR NEGATIVE 06/06/2019 1051   BILIRUBINUR NEGATIVE 06/06/2019 1051   [place] 06/06/2019  1051   PROTEINUR NEGATIVE 06/06/2019 1051   NITRITE NEGATIVE 06/06/2019 1051   LEUKOCYTESUR MODERATE (A) 06/06/2019 1051    Sepsis Labs: Lactic Acid, Venous No results found for: "LATICACIDVEN"  MICROBIOLOGY: Recent Results (from the past 240 hour(s))  MRSA Next Gen by PCR, Nasal     Status: None   Collection Time: 09/05/22  2:15 AM   Specimen: Nasal Mucosa; Nasal Swab  Result Value Ref Range Status   MRSA by PCR Next Gen NOT DETECTED NOT DETECTED Final    Comment: (NOTE) The GeneXpert MRSA Assay (FDA approved for NASAL specimens only), is one component of a comprehensive MRSA colonization surveillance program. It is not intended to diagnose MRSA infection nor to guide or monitor treatment for MRSA infections. Test performance is not FDA approved in patients less than 58 years old. Performed at [facility], [place] [address], [place],  [address]     RADIOLOGY STUDIES/RESULTS: US PELVIC COMPLETE WITH TRANSVAGINAL  Result Date: 09/05/2022 CLINICAL DATA:  Evaluate adnexal  mass identified on recent CT. EXAM: TRANSABDOMINAL AND TRANSVAGINAL ULTRASOUND OF PELVIS TECHNIQUE: Both transabdominal and transvaginal ultrasound examinations of the pelvis were performed. Transabdominal technique was performed for global imaging of the pelvis including uterus, ovaries, adnexal regions, and pelvic cul-de-sac. It was necessary to proceed with endovaginal exam following the transabdominal exam to visualize the uterus, endometrium and bilateral adnexa. COMPARISON:  CT AP 09/04/2022 FINDINGS: Uterus Measurements: 4.6 x 2.4 x 3.7 cm = volume: 21.4 mL. No fibroids or other mass visualized. Endometrium Thickness: 2.9 mm. Fluid containing low level internal echoes noted within the endometrial canal. Right ovary Measurements: Not visualized Left ovary The left ovary is not visualized. There is a indeterminate, solid appearing structure within the left adnexa which measures 3.1 x 2.3 by 2.6 cm. No  increased vascularity noted. Other findings None IMPRESSION: 1. Indeterminate solid-appearing structure within the left adnexa measuring up to 3.1 cm. Recommend further evaluation with pre and post contrast pelvic MRI. 2. Complex fluid within the endometrial canal. This can be further evaluated with pelvic MRI. 3. Nonvisualization of the right ovary. Electronically Signed   By: Kerby Moors M.D.   On: 09/05/2022 08:13   CT Angio Chest/Abd/Pel for Dissection W and/or Wo Contrast  Result Date: 09/04/2022 CLINICAL DATA:  Chest and abdominal pain EXAM: CT ANGIOGRAPHY CHEST, ABDOMEN AND PELVIS TECHNIQUE: Non-contrast CT of the chest was initially obtained. Multidetector CT imaging through the chest, abdomen and pelvis was performed using the standard protocol during bolus administration of intravenous contrast. Multiplanar reconstructed images and MIPs were obtained and reviewed to evaluate the vascular anatomy. RADIATION DOSE REDUCTION: This exam was performed according to the departmental dose-optimization program which includes automated exposure control, adjustment of the mA and/or kV according to patient size and/or use of iterative reconstruction technique. CONTRAST:  164m OMNIPAQUE IOHEXOL 350 MG/ML SOLN COMPARISON:  04/23/2022 FINDINGS: CTA CHEST FINDINGS Cardiovascular: Initial precontrast images demonstrate atherosclerotic calcification. No hyperdense crescent to suggest acute aortic injury is noted. Post-contrast images demonstrate similar aortic atherosclerotic changes without aneurysmal dilatation or dissection. Heart is not significantly enlarged in size. Mild coronary calcifications are seen. Pulmonary artery shows no evidence of pulmonary emboli although not specifically timed for embolus evaluation. Mediastinum/Nodes: Thoracic inlet is within normal limits. Scattered small mediastinal nodes are seen although not significant by size criteria. These may be reactive in nature. The esophagus is within  normal limits. Lungs/Pleura: Lungs are well aerated bilaterally. No focal infiltrate or sizable effusion is seen. No parenchymal nodules are seen. No pneumothorax is noted. Musculoskeletal: No acute rib abnormality is noted. Mild degenerative changes of the thoracic spine are seen. Postsurgical changes are noted in the lower cervical spine. No compression deformity is noted. Review of the MIP images confirms the above findings. CTA ABDOMEN AND PELVIS FINDINGS VASCULAR Aorta: Normal caliber aorta without aneurysm, dissection, vasculitis or significant stenosis. Celiac: Patent without evidence of aneurysm, dissection, vasculitis or significant stenosis. SMA: Patent without evidence of aneurysm, dissection, vasculitis or significant stenosis. Renals: Both renal arteries are patent without evidence of aneurysm, dissection, vasculitis, fibromuscular dysplasia or significant stenosis. IMA: Patent without evidence of aneurysm, dissection, vasculitis or significant stenosis. Inflow: Iliacs demonstrate mild atherosclerotic calcifications. No aneurysmal dilatation or dissection is seen. Veins: No specific venous abnormality is noted. Review of the MIP images confirms the above findings. NON-VASCULAR Hepatobiliary: Fatty infiltration of the liver is noted. The gallbladder has been surgically removed. Pancreas: Unremarkable. No pancreatic ductal dilatation or surrounding inflammatory changes. Spleen: Normal in size without focal abnormality.  Adrenals/Urinary Tract: Adrenal glands are within normal limits. Kidneys demonstrate a normal enhancement pattern. No renal calculi are seen. Large lobular renal cyst is noted on the left measuring 6.1 cm stable in appearance from a prior CT examination. Given its stability in simple appearing nature. No further follow-up is recommended. No obstructive changes are seen. Bladder is decompressed. Stomach/Bowel: Very mild diverticular change of the colon is noted. No obstructive or  inflammatory changes of the colon are seen. Changes of right hemicolectomy are noted. The anastomosis appears widely patent. The appendix is not visualized likely related to the prior surgery. Small bowel and stomach are unremarkable. Lymphatic: No lymphadenopathy is identified. Reproductive: Uterus and right adnexa are unremarkable. Left adnexa demonstrates a 3.4 cm lesion. This has slightly decreased attenuation although is not cystic in nature. Patient is postmenopausal. Other: No abdominal wall hernia or abnormality. No abdominopelvic ascites. Musculoskeletal: Postsurgical changes in the lower lumbar spine are noted. No acute bony abnormality is seen. Review of the MIP images confirms the above findings. IMPRESSION: No evidence of aortic dissection or aneurysmal dilatation. No evidence of pulmonary emboli. Diverticulosis without diverticulitis. 3.4 cm left adnexal lesion in a postmenopausal patient. Given the postmenopausal state prompt follow-up ultrasound is recommended. Reference: JACR 2020 Feb;17(2):248-254 Electronically Signed   By: Inez Catalina M.D.   On: 09/04/2022 22:57     LOS: 0 days   Oren Binet, MD  [facility]    To contact the attending provider between 7A-7P or the covering provider during after hours 7P-7A, please log into the web site [email or web address] and access using universal [place] password for that web site. If you do not have the password, please call the hospital operator.  09/05/2022, 9:42 AM

## 2022-09-05 NOTE — ED Notes (Signed)
Gave report to [facility]

## 2022-09-05 NOTE — ED Notes (Signed)
[facility] called for transportation 

## 2022-09-06 DIAGNOSIS — N9489 Other specified conditions associated with female genital organs and menstrual cycle: Secondary | ICD-10-CM | POA: Diagnosis not present

## 2022-09-06 DIAGNOSIS — I10 Essential (primary) hypertension: Secondary | ICD-10-CM | POA: Diagnosis not present

## 2022-09-06 DIAGNOSIS — R079 Chest pain, unspecified: Secondary | ICD-10-CM | POA: Diagnosis not present

## 2022-09-06 DIAGNOSIS — E785 Hyperlipidemia, unspecified: Secondary | ICD-10-CM | POA: Diagnosis not present

## 2022-09-06 MED ORDER — MORPHINE SULFATE (PF) 2 MG/ML IV SOLN: 1.0000 mg | INTRAVENOUS | Status: DC | PRN

## 2022-09-06 NOTE — Progress Notes (Signed)
Seen and examined Pain has resolved-appears comfortable. Has muscle relaxants, narcotics from her most recent hospitalization at [facility] following lumbar spine surgery. Plan is to discharge-she will make an appointment with her outpatient physicians including her spine surgeon. See discharge summary for details.

## 2022-09-06 NOTE — Plan of Care (Signed)

## 2022-09-06 NOTE — Discharge Summary (Signed)
PATIENT DETAILS Name: Meredith Cole Age: 76 y.o. Sex: female Date of Birth: 1946-03-06 MRN: [id number]. Admitting Physician: No admitting provider for patient encounter. LFY:[id number], Marjory Lies, MD  Admit Date: 09/04/2022 Discharge date: 09/06/2022  Recommendations for Outpatient Follow-up:  Follow up with PCP in 1-2 weeks Please obtain CMP/CBC in one week Please ensure follow-up with orthopedic/spine surgeon Left adnexal lesion seen incidentally-we will need outpatient work-up/GYN referral.  Admitted From:  Home  Disposition: Home   Discharge Condition: good  CODE STATUS:   Code Status: Full Code   Diet recommendation:  Diet Order             Diet - low sodium heart healthy           Diet Heart Room service appropriate? Yes; Fluid consistency: Thin  Diet effective now                    Brief Summary: Patient is a 76 y.o.  female with history of HTN, HLD, left breast lumpectomy with radioactive seed localization July 2023, recent low back surgery for spinal stenosis on 08/27/2022-presented with sudden onset of left-sided flank/chest pain-radiating to the center of the chest and to her left upper extremity.  CTA chest was negative for PE/aortic dissection-she was subsequently transferred to the [facility] service for further evaluation and treatment.   Significant events: 9/30>> admit to [facility] for evaluation of chest pain   Significant studies: 9/30>> CTA chest/abdomen/pelvis: No PE/dissection.  3.4 cm left adnexal lesion. 10/01>> pelvic ultrasound: Solid appearing structure in the left adnexa   Significant microbiology data: None   Procedures: None   Consults: Cardiology  Brief Hospital Course: Chest pain/back pain: With both typical/atypical features-has numerous risk factors (HTN, HLD, strong family history-Sister had CABG in her early 12s).  Thankfully ACS ruled out.  CTA chest negative for dissection/PE.  Echo with preserved EF and without any wall motion  abnormalities.  Evaluated by cardiology-felt not to noncardiac pain.  On 10/1-her chest pain/flank pain had resolved-and she had pain in the upper back-this was managed with supportive care-Tylenol/muscle relaxant/as needed narcotics.  This morning-all of her pain has completely resolved-including back/flank/chest pain.  Suspect this was likely musculoskeletal in nature-she has narcotics/muscle relaxants from her most recent back surgery-she will follow-up with her orthopedic surgeon/primary care practitioner for further continued care.  Stable for discharge today.     HLD: Continue statin   HTN: We will resume her usual antihypertensive medications on discharge.   Chronic back pain/lumbar spinal stenosis: S/p recent surgery on 9/23 at [facility] needed narcotics.   Left adnexal lesion: Incidental finding-stable for further work-up to be done in the outpatient setting.  Patient/daughter aware of this finding and need for further evaluation to be done in the outpatient setting.   Depression/anxiety: Resume home meds once medication reconciliation has been completed.   Obesity: Estimated body mass index is 30.21 kg/m as calculated from the following:   Height as of this encounter: '5\' 7"'$  (1.702 m).   Weight as of this encounter: 87.5 kg.    Discharge Diagnoses:  Principal Problem:   Chest pain, rule out acute myocardial infarction Active Problems:   HLD (hyperlipidemia)   Adnexal mass   Hypokalemia   HTN (hypertension)   Discharge Instructions:  Activity:  As tolerated  Discharge Instructions     Call MD for:  persistant nausea and vomiting   Complete by: As directed    Call MD  for:  severe uncontrolled pain   Complete by: As directed    Diet - low sodium heart healthy   Complete by: As directed    Discharge instructions   Complete by: As directed    Follow with Primary MD  London Pepper, MD in 1-2 weeks  Please follow-up with  your spine surgeon at [facility] in the next week or so.  Incidental finding-you were found to have a left ovarian lesion on CT imaging, this was confirmed on a pelvic ultrasound.  Please ask your primary care practitioner for outpatient referral to gynecology.  In some instances-these lesions can turn cancerous, hence you would need further work-up.  Please get a complete blood count and chemistry panel checked by your Primary MD at your next visit, and again as instructed by your Primary MD.  Get Medicines reviewed and adjusted: Please take all your medications with you for your next visit with your Primary MD  Laboratory/radiological data: Please request your Primary MD to go over all hospital tests and procedure/radiological results at the follow up, please ask your Primary MD to get all Hospital records sent to his/her office.  In some cases, they will be blood work, cultures and biopsy results pending at the time of your discharge. Please request that your primary care M.D. follows up on these results.  Also Note the following: If you experience worsening of your admission symptoms, develop shortness of breath, life threatening emergency, suicidal or homicidal thoughts you must seek medical attention immediately by calling 911 or calling your MD immediately  if symptoms less severe.  You must read complete instructions/literature along with all the possible adverse reactions/side effects for all the Medicines you take and that have been prescribed to you. Take any new Medicines after you have completely understood and accpet all the possible adverse reactions/side effects.   Do not drive when taking Pain medications or sleeping medications (Benzodaizepines)  Do not take more than prescribed Pain, Sleep and Anxiety Medications. It is not advisable to combine anxiety,sleep and pain medications without talking with your primary care practitioner  Special Instructions: If  you have smoked or chewed Tobacco  in the last 2 yrs please stop smoking, stop any regular Alcohol  and or any Recreational drug use.  Wear Seat belts while driving.  Please note: You were cared for by a hospitalist during your hospital stay. Once you are discharged, your primary care physician will handle any further medical issues. Please note that NO REFILLS for any discharge medications will be authorized once you are discharged, as it is imperative that you return to your primary care physician (or establish a relationship with a primary care physician if you do not have one) for your post hospital discharge needs so that they can reassess your need for medications and monitor your lab values.   Increase activity slowly   Complete by: As directed    No dressing needed   Complete by: As directed       Allergies as of 09/06/2022       Reactions   Lisinopril Other (See Comments)   COUGH        Medication List     STOP taking these medications    acetaminophen 500 MG tablet Commonly known as: TYLENOL       TAKE these medications    atorvastatin 20 MG tablet Commonly known as: LIPITOR Take 20 mg by mouth at bedtime.   buPROPion 150 MG 24 hr  tablet Commonly known as: WELLBUTRIN XL Take 150 mg by mouth daily.   dorzolamidel-timolol 22.3-6.8 MG/ML Soln ophthalmic solution Commonly known as: COSOPT Place 1 drop into both eyes 2 (two) times daily.   DULoxetine 60 MG capsule Commonly known as: CYMBALTA Take 60 mg by mouth daily.   estradiol 0.1 MG/GM vaginal cream Commonly known as: ESTRACE Place 1 Applicatorful vaginally once a week.   fexofenadine 180 MG tablet Commonly known as: ALLEGRA Take 180 mg by mouth daily.   hydrochlorothiazide 12.5 MG capsule Commonly known as: MICROZIDE Take 12.5 mg by mouth daily.   HYDROcodone-acetaminophen 5-325 MG tablet Commonly known as: NORCO/VICODIN Take 1-2 tablets by mouth every 4 (four) hours as needed for pain.    Melatonin 10 MG Tabs Take 1 tablet by mouth at bedtime as needed (sleep).   methocarbamol 500 MG tablet Commonly known as: ROBAXIN Take 500 mg by mouth 3 (three) times daily.   MULTIVITAMINS PO Take 1 tablet by mouth daily.   Ozempic (0.25 or 0.5 MG/DOSE) 2 MG/3ML Sopn Generic drug: Semaglutide(0.25 or 0.'5MG'$ /DOS) Inject 0.5 mg into the skin once a week. Monday   Systane Balance 0.6 % Soln Generic drug: Propylene Glycol Place 1 drop into both eyes daily as needed (dry eyes).   tretinoin 0.025 % cream Commonly known as: RETIN-A Apply 1 application  topically 3 (three) times a week.               Discharge Care Instructions  (From admission, onward)           Start     Ordered   09/06/22 0000  No dressing needed        09/06/22 2703            Follow-up Information     London Pepper, MD. Schedule an appointment as soon as possible for a visit in 1 week(s).   Specialty: Family Medicine Contact information: [address] [place] [address] [phone]                Allergies  Allergen Reactions   Lisinopril Other (See Comments)    COUGH     Other Procedures/Studies: ECHOCARDIOGRAM COMPLETE  Result Date: 09/05/2022    ECHOCARDIOGRAM REPORT   Patient Name:   Meredith Cole Date of Exam: 09/05/2022 Medical Rec #:  [id number]         Height:       67.0 in Accession #:    [id number]        Weight:       192.9 lb Date of Birth:  [birth year]         BSA:          1.991 m Patient Age:    67 years          BP:           156/87 mmHg Patient Gender: F                 HR:           70 bpm. Exam Location:  Inpatient Procedure: 2D Echo, Cardiac Doppler and Color Doppler Indications:     Chest pain  History:         Patient has no prior history of Echocardiogram examinations.                  Risk Factors:Former Smoker, Hypertension and Dyslipidemia.  Sonographer:     Clayton Lefort RDCS (AE) Referring  Phys:  VASUNDHRA RATHORE Diagnosing  Phys: Vernell Leep MD IMPRESSIONS  1. Left ventricular ejection fraction, by estimation, is 55 to 60%. The left ventricle has normal function. The left ventricle has no regional wall motion abnormalities. Left ventricular diastolic parameters were normal.  2. Right ventricular systolic function is normal. The right ventricular size is normal.  3. The mitral valve is normal in structure. Mild mitral valve regurgitation. No evidence of mitral stenosis.  4. The aortic valve is normal in structure. Aortic valve regurgitation is not visualized. No aortic stenosis is present. FINDINGS  Left Ventricle: Left ventricular ejection fraction, by estimation, is 55 to 60%. The left ventricle has normal function. The left ventricle has no regional wall motion abnormalities. The left ventricular internal cavity size was normal in size. There is  no left ventricular hypertrophy. Left ventricular diastolic parameters were normal. Right Ventricle: The right ventricular size is normal. No increase in right ventricular wall thickness. Right ventricular systolic function is normal. Left Atrium: Left atrial size was normal in size. Right Atrium: Right atrial size was normal in size. Pericardium: There is no evidence of pericardial effusion. Mitral Valve: The mitral valve is normal in structure. Mild mitral valve regurgitation. No evidence of mitral valve stenosis. MV peak gradient, 3.4 mmHg. The mean mitral valve gradient is 1.0 mmHg. Tricuspid Valve: The tricuspid valve is normal in structure. Tricuspid valve regurgitation is mild . No evidence of tricuspid stenosis. Aortic Valve: The aortic valve is normal in structure. Aortic valve regurgitation is not visualized. No aortic stenosis is present. Aortic valve mean gradient measures 4.0 mmHg. Aortic valve peak gradient measures 7.2 mmHg. Aortic valve area, by VTI measures 2.21 cm. Pulmonic Valve: The pulmonic valve was normal in structure. Pulmonic valve regurgitation is not  visualized. No evidence of pulmonic stenosis. Aorta: The aortic root is normal in size and structure. IAS/Shunts: The interatrial septum was not assessed.  LEFT VENTRICLE PLAX 2D LVIDd:         4.70 cm     Diastology LVIDs:         3.60 cm     LV e' medial:    9.57 cm/s LV PW:         1.00 cm     LV E/e' medial:  10.2 LV IVS:        0.90 cm     LV e' lateral:   10.40 cm/s LVOT diam:     1.90 cm     LV E/e' lateral: 9.4 LV SV:         67 LV SV Index:   34 LVOT Area:     2.84 cm  LV Volumes (MOD) LV vol d, MOD A2C: 80.5 ml LV vol d, MOD A4C: 97.3 ml LV vol s, MOD A2C: 33.5 ml LV vol s, MOD A4C: 44.4 ml LV SV MOD A2C:     47.0 ml LV SV MOD A4C:     97.3 ml LV SV MOD BP:      54.4 ml RIGHT VENTRICLE             IVC RV Basal diam:  2.50 cm     IVC diam: 1.40 cm RV S prime:     10.30 cm/s TAPSE (M-mode): 2.0 cm LEFT ATRIUM             Index        RIGHT ATRIUM           Index LA diam:  3.00 cm 1.51 cm/m   RA Area:     11.00 cm LA Vol (A2C):   29.9 ml 15.01 ml/m  RA Volume:   23.20 ml  11.65 ml/m LA Vol (A4C):   31.2 ml 15.67 ml/m LA Biplane Vol: 33.8 ml 16.97 ml/m  AORTIC VALVE AV Area (Vmax):    2.14 cm AV Area (Vmean):   2.18 cm AV Area (VTI):     2.21 cm AV Vmax:           134.00 cm/s AV Vmean:          89.800 cm/s AV VTI:            0.305 m AV Peak Grad:      7.2 mmHg AV Mean Grad:      4.0 mmHg LVOT Vmax:         101.00 cm/s LVOT Vmean:        69.200 cm/s LVOT VTI:          0.238 m LVOT/AV VTI ratio: 0.78  AORTA Ao Root diam: 2.90 cm Ao Asc diam:  2.80 cm MITRAL VALVE MV Area (PHT): 4.29 cm    SHUNTS MV Area VTI:   2.85 cm    Systemic VTI:  0.24 m MV Peak grad:  3.4 mmHg    Systemic Diam: 1.90 cm MV Mean grad:  1.0 mmHg MV Vmax:       0.92 m/s MV Vmean:      57.2 cm/s MV Decel Time: 177 msec MV E velocity: 97.30 cm/s MV A velocity: 80.90 cm/s MV E/A ratio:  1.20 Manish Patwardhan MD Electronically signed by Vernell Leep MD Signature Date/Time: 09/05/2022/2:43:06 PM    Final    US PELVIC  COMPLETE WITH TRANSVAGINAL  Result Date: 09/05/2022 CLINICAL DATA:  Evaluate adnexal mass identified on recent CT. EXAM: TRANSABDOMINAL AND TRANSVAGINAL ULTRASOUND OF PELVIS TECHNIQUE: Both transabdominal and transvaginal ultrasound examinations of the pelvis were performed. Transabdominal technique was performed for global imaging of the pelvis including uterus, ovaries, adnexal regions, and pelvic cul-de-sac. It was necessary to proceed with endovaginal exam following the transabdominal exam to visualize the uterus, endometrium and bilateral adnexa. COMPARISON:  CT AP 09/04/2022 FINDINGS: Uterus Measurements: 4.6 x 2.4 x 3.7 cm = volume: 21.4 mL. No fibroids or other mass visualized. Endometrium Thickness: 2.9 mm. Fluid containing low level internal echoes noted within the endometrial canal. Right ovary Measurements: Not visualized Left ovary The left ovary is not visualized. There is a indeterminate, solid appearing structure within the left adnexa which measures 3.1 x 2.3 by 2.6 cm. No increased vascularity noted. Other findings None IMPRESSION: 1. Indeterminate solid-appearing structure within the left adnexa measuring up to 3.1 cm. Recommend further evaluation with pre and post contrast pelvic MRI. 2. Complex fluid within the endometrial canal. This can be further evaluated with pelvic MRI. 3. Nonvisualization of the right ovary. Electronically Signed   By: Kerby Moors M.D.   On: 09/05/2022 08:13   CT Angio Chest/Abd/Pel for Dissection W and/or Wo Contrast  Result Date: 09/04/2022 CLINICAL DATA:  Chest and abdominal pain EXAM: CT ANGIOGRAPHY CHEST, ABDOMEN AND PELVIS TECHNIQUE: Non-contrast CT of the chest was initially obtained. Multidetector CT imaging through the chest, abdomen and pelvis was performed using the standard protocol during bolus administration of intravenous contrast. Multiplanar reconstructed images and MIPs were obtained and reviewed to evaluate the vascular anatomy. RADIATION DOSE  REDUCTION: This exam was performed according to the departmental dose-optimization program which includes automated exposure  control, adjustment of the mA and/or kV according to patient size and/or use of iterative reconstruction technique. CONTRAST:  158m OMNIPAQUE IOHEXOL 350 MG/ML SOLN COMPARISON:  04/23/2022 FINDINGS: CTA CHEST FINDINGS Cardiovascular: Initial precontrast images demonstrate atherosclerotic calcification. No hyperdense crescent to suggest acute aortic injury is noted. Post-contrast images demonstrate similar aortic atherosclerotic changes without aneurysmal dilatation or dissection. Heart is not significantly enlarged in size. Mild coronary calcifications are seen. Pulmonary artery shows no evidence of pulmonary emboli although not specifically timed for embolus evaluation. Mediastinum/Nodes: Thoracic inlet is within normal limits. Scattered small mediastinal nodes are seen although not significant by size criteria. These may be reactive in nature. The esophagus is within normal limits. Lungs/Pleura: Lungs are well aerated bilaterally. No focal infiltrate or sizable effusion is seen. No parenchymal nodules are seen. No pneumothorax is noted. Musculoskeletal: No acute rib abnormality is noted. Mild degenerative changes of the thoracic spine are seen. Postsurgical changes are noted in the lower cervical spine. No compression deformity is noted. Review of the MIP images confirms the above findings. CTA ABDOMEN AND PELVIS FINDINGS VASCULAR Aorta: Normal caliber aorta without aneurysm, dissection, vasculitis or significant stenosis. Celiac: Patent without evidence of aneurysm, dissection, vasculitis or significant stenosis. SMA: Patent without evidence of aneurysm, dissection, vasculitis or significant stenosis. Renals: Both renal arteries are patent without evidence of aneurysm, dissection, vasculitis, fibromuscular dysplasia or significant stenosis. IMA: Patent without evidence of aneurysm,  dissection, vasculitis or significant stenosis. Inflow: Iliacs demonstrate mild atherosclerotic calcifications. No aneurysmal dilatation or dissection is seen. Veins: No specific venous abnormality is noted. Review of the MIP images confirms the above findings. NON-VASCULAR Hepatobiliary: Fatty infiltration of the liver is noted. The gallbladder has been surgically removed. Pancreas: Unremarkable. No pancreatic ductal dilatation or surrounding inflammatory changes. Spleen: Normal in size without focal abnormality. Adrenals/Urinary Tract: Adrenal glands are within normal limits. Kidneys demonstrate a normal enhancement pattern. No renal calculi are seen. Large lobular renal cyst is noted on the left measuring 6.1 cm stable in appearance from a prior CT examination. Given its stability in simple appearing nature. No further follow-up is recommended. No obstructive changes are seen. Bladder is decompressed. Stomach/Bowel: Very mild diverticular change of the colon is noted. No obstructive or inflammatory changes of the colon are seen. Changes of right hemicolectomy are noted. The anastomosis appears widely patent. The appendix is not visualized likely related to the prior surgery. Small bowel and stomach are unremarkable. Lymphatic: No lymphadenopathy is identified. Reproductive: Uterus and right adnexa are unremarkable. Left adnexa demonstrates a 3.4 cm lesion. This has slightly decreased attenuation although is not cystic in nature. Patient is postmenopausal. Other: No abdominal wall hernia or abnormality. No abdominopelvic ascites. Musculoskeletal: Postsurgical changes in the lower lumbar spine are noted. No acute bony abnormality is seen. Review of the MIP images confirms the above findings. IMPRESSION: No evidence of aortic dissection or aneurysmal dilatation. No evidence of pulmonary emboli. Diverticulosis without diverticulitis. 3.4 cm left adnexal lesion in a postmenopausal patient. Given the postmenopausal  state prompt follow-up ultrasound is recommended. Reference: JACR 2020 Feb;17(2):248-254 Electronically Signed   By: MInez CatalinaM.D.   On: 09/04/2022 22:57     TODAY-DAY OF DISCHARGE:  Subjective:   Meredith Cole has no headache,no chest abdominal pain,no new weakness tingling or numbness, feels much better wants to go home today.   Objective:   Blood pressure 116/61, pulse 69, temperature 98.3 F (36.8 C), temperature source Oral, resp. rate 17, height '5\' 7"'$  (1.702 m), weight 87.5  kg, SpO2 93 %.  Intake/Output Summary (Last 24 hours) at 09/06/2022 0859 Last data filed at 09/06/2022 0800 Gross per 24 hour  Intake 920 ml  Output 600 ml  Net 320 ml   Filed Weights   09/04/22 2058 09/05/22 0209  Weight: 85.7 kg 87.5 kg    Exam: Awake Alert, Oriented *3, No new F.N deficits, Normal affect [place].AT,PERRAL Supple Neck,No JVD, No cervical lymphadenopathy appriciated.  Symmetrical Chest wall movement, Good air movement bilaterally, CTAB RRR,No Gallops,Rubs or new Murmurs, No Parasternal Heave +ve B.Sounds, Abd Soft, Non tender, No organomegaly appriciated, No rebound -guarding or rigidity. No Cyanosis, Clubbing or edema, No new Rash or bruise   PERTINENT RADIOLOGIC STUDIES: ECHOCARDIOGRAM COMPLETE  Result Date: 09/05/2022    ECHOCARDIOGRAM REPORT   Patient Name:   Meredith Cole Date of Exam: 09/05/2022 Medical Rec #:  [id number]         Height:       67.0 in Accession #:    [id number]        Weight:       192.9 lb Date of Birth:  1946/08/16         BSA:          1.991 m Patient Age:    38 years          BP:           156/87 mmHg Patient Gender: F                 HR:           70 bpm. Exam Location:  Inpatient Procedure: 2D Echo, Cardiac Doppler and Color Doppler Indications:     Chest pain  History:         Patient has no prior history of Echocardiogram examinations.                  Risk Factors:Former Smoker, Hypertension and Dyslipidemia.  Sonographer:     Clayton Lefort RDCS  (AE) Referring Phys:  Shela Leff Diagnosing Phys: Vernell Leep MD IMPRESSIONS  1. Left ventricular ejection fraction, by estimation, is 55 to 60%. The left ventricle has normal function. The left ventricle has no regional wall motion abnormalities. Left ventricular diastolic parameters were normal.  2. Right ventricular systolic function is normal. The right ventricular size is normal.  3. The mitral valve is normal in structure. Mild mitral valve regurgitation. No evidence of mitral stenosis.  4. The aortic valve is normal in structure. Aortic valve regurgitation is not visualized. No aortic stenosis is present. FINDINGS  Left Ventricle: Left ventricular ejection fraction, by estimation, is 55 to 60%. The left ventricle has normal function. The left ventricle has no regional wall motion abnormalities. The left ventricular internal cavity size was normal in size. There is  no left ventricular hypertrophy. Left ventricular diastolic parameters were normal. Right Ventricle: The right ventricular size is normal. No increase in right ventricular wall thickness. Right ventricular systolic function is normal. Left Atrium: Left atrial size was normal in size. Right Atrium: Right atrial size was normal in size. Pericardium: There is no evidence of pericardial effusion. Mitral Valve: The mitral valve is normal in structure. Mild mitral valve regurgitation. No evidence of mitral valve stenosis. MV peak gradient, 3.4 mmHg. The mean mitral valve gradient is 1.0 mmHg. Tricuspid Valve: The tricuspid valve is normal in structure. Tricuspid valve regurgitation is mild . No evidence of tricuspid stenosis. Aortic Valve: The aortic valve is normal in  structure. Aortic valve regurgitation is not visualized. No aortic stenosis is present. Aortic valve mean gradient measures 4.0 mmHg. Aortic valve peak gradient measures 7.2 mmHg. Aortic valve area, by VTI measures 2.21 cm. Pulmonic Valve: The pulmonic valve was normal in  structure. Pulmonic valve regurgitation is not visualized. No evidence of pulmonic stenosis. Aorta: The aortic root is normal in size and structure. IAS/Shunts: The interatrial septum was not assessed.  LEFT VENTRICLE PLAX 2D LVIDd:         4.70 cm     Diastology LVIDs:         3.60 cm     LV e' medial:    9.57 cm/s LV PW:         1.00 cm     LV E/e' medial:  10.2 LV IVS:        0.90 cm     LV e' lateral:   10.40 cm/s LVOT diam:     1.90 cm     LV E/e' lateral: 9.4 LV SV:         67 LV SV Index:   34 LVOT Area:     2.84 cm  LV Volumes (MOD) LV vol d, MOD A2C: 80.5 ml LV vol d, MOD A4C: 97.3 ml LV vol s, MOD A2C: 33.5 ml LV vol s, MOD A4C: 44.4 ml LV SV MOD A2C:     47.0 ml LV SV MOD A4C:     97.3 ml LV SV MOD BP:      54.4 ml RIGHT VENTRICLE             IVC RV Basal diam:  2.50 cm     IVC diam: 1.40 cm RV S prime:     10.30 cm/s TAPSE (M-mode): 2.0 cm LEFT ATRIUM             Index        RIGHT ATRIUM           Index LA diam:        3.00 cm 1.51 cm/m   RA Area:     11.00 cm LA Vol (A2C):   29.9 ml 15.01 ml/m  RA Volume:   23.20 ml  11.65 ml/m LA Vol (A4C):   31.2 ml 15.67 ml/m LA Biplane Vol: 33.8 ml 16.97 ml/m  AORTIC VALVE AV Area (Vmax):    2.14 cm AV Area (Vmean):   2.18 cm AV Area (VTI):     2.21 cm AV Vmax:           134.00 cm/s AV Vmean:          89.800 cm/s AV VTI:            0.305 m AV Peak Grad:      7.2 mmHg AV Mean Grad:      4.0 mmHg LVOT Vmax:         101.00 cm/s LVOT Vmean:        69.200 cm/s LVOT VTI:          0.238 m LVOT/AV VTI ratio: 0.78  AORTA Ao Root diam: 2.90 cm Ao Asc diam:  2.80 cm MITRAL VALVE MV Area (PHT): 4.29 cm    SHUNTS MV Area VTI:   2.85 cm    Systemic VTI:  0.24 m MV Peak grad:  3.4 mmHg    Systemic Diam: 1.90 cm MV Mean grad:  1.0 mmHg MV Vmax:       0.92 m/s MV Vmean:  57.2 cm/s MV Decel Time: 177 msec MV E velocity: 97.30 cm/s MV A velocity: 80.90 cm/s MV E/A ratio:  1.20 Manish Patwardhan MD Electronically signed by Vernell Leep MD Signature Date/Time:  09/05/2022/2:43:06 PM    Final    US PELVIC COMPLETE WITH TRANSVAGINAL  Result Date: 09/05/2022 CLINICAL DATA:  Evaluate adnexal mass identified on recent CT. EXAM: TRANSABDOMINAL AND TRANSVAGINAL ULTRASOUND OF PELVIS TECHNIQUE: Both transabdominal and transvaginal ultrasound examinations of the pelvis were performed. Transabdominal technique was performed for global imaging of the pelvis including uterus, ovaries, adnexal regions, and pelvic cul-de-sac. It was necessary to proceed with endovaginal exam following the transabdominal exam to visualize the uterus, endometrium and bilateral adnexa. COMPARISON:  CT AP 09/04/2022 FINDINGS: Uterus Measurements: 4.6 x 2.4 x 3.7 cm = volume: 21.4 mL. No fibroids or other mass visualized. Endometrium Thickness: 2.9 mm. Fluid containing low level internal echoes noted within the endometrial canal. Right ovary Measurements: Not visualized Left ovary The left ovary is not visualized. There is a indeterminate, solid appearing structure within the left adnexa which measures 3.1 x 2.3 by 2.6 cm. No increased vascularity noted. Other findings None IMPRESSION: 1. Indeterminate solid-appearing structure within the left adnexa measuring up to 3.1 cm. Recommend further evaluation with pre and post contrast pelvic MRI. 2. Complex fluid within the endometrial canal. This can be further evaluated with pelvic MRI. 3. Nonvisualization of the right ovary. Electronically Signed   By: Kerby Moors M.D.   On: 09/05/2022 08:13   CT Angio Chest/Abd/Pel for Dissection W and/or Wo Contrast  Result Date: 09/04/2022 CLINICAL DATA:  Chest and abdominal pain EXAM: CT ANGIOGRAPHY CHEST, ABDOMEN AND PELVIS TECHNIQUE: Non-contrast CT of the chest was initially obtained. Multidetector CT imaging through the chest, abdomen and pelvis was performed using the standard protocol during bolus administration of intravenous contrast. Multiplanar reconstructed images and MIPs were obtained and reviewed to  evaluate the vascular anatomy. RADIATION DOSE REDUCTION: This exam was performed according to the departmental dose-optimization program which includes automated exposure control, adjustment of the mA and/or kV according to patient size and/or use of iterative reconstruction technique. CONTRAST:  151m OMNIPAQUE IOHEXOL 350 MG/ML SOLN COMPARISON:  04/23/2022 FINDINGS: CTA CHEST FINDINGS Cardiovascular: Initial precontrast images demonstrate atherosclerotic calcification. No hyperdense crescent to suggest acute aortic injury is noted. Post-contrast images demonstrate similar aortic atherosclerotic changes without aneurysmal dilatation or dissection. Heart is not significantly enlarged in size. Mild coronary calcifications are seen. Pulmonary artery shows no evidence of pulmonary emboli although not specifically timed for embolus evaluation. Mediastinum/Nodes: Thoracic inlet is within normal limits. Scattered small mediastinal nodes are seen although not significant by size criteria. These may be reactive in nature. The esophagus is within normal limits. Lungs/Pleura: Lungs are well aerated bilaterally. No focal infiltrate or sizable effusion is seen. No parenchymal nodules are seen. No pneumothorax is noted. Musculoskeletal: No acute rib abnormality is noted. Mild degenerative changes of the thoracic spine are seen. Postsurgical changes are noted in the lower cervical spine. No compression deformity is noted. Review of the MIP images confirms the above findings. CTA ABDOMEN AND PELVIS FINDINGS VASCULAR Aorta: Normal caliber aorta without aneurysm, dissection, vasculitis or significant stenosis. Celiac: Patent without evidence of aneurysm, dissection, vasculitis or significant stenosis. SMA: Patent without evidence of aneurysm, dissection, vasculitis or significant stenosis. Renals: Both renal arteries are patent without evidence of aneurysm, dissection, vasculitis, fibromuscular dysplasia or significant stenosis.  IMA: Patent without evidence of aneurysm, dissection, vasculitis or significant stenosis. Inflow: Iliacs demonstrate mild  atherosclerotic calcifications. No aneurysmal dilatation or dissection is seen. Veins: No specific venous abnormality is noted. Review of the MIP images confirms the above findings. NON-VASCULAR Hepatobiliary: Fatty infiltration of the liver is noted. The gallbladder has been surgically removed. Pancreas: Unremarkable. No pancreatic ductal dilatation or surrounding inflammatory changes. Spleen: Normal in size without focal abnormality. Adrenals/Urinary Tract: Adrenal glands are within normal limits. Kidneys demonstrate a normal enhancement pattern. No renal calculi are seen. Large lobular renal cyst is noted on the left measuring 6.1 cm stable in appearance from a prior CT examination. Given its stability in simple appearing nature. No further follow-up is recommended. No obstructive changes are seen. Bladder is decompressed. Stomach/Bowel: Very mild diverticular change of the colon is noted. No obstructive or inflammatory changes of the colon are seen. Changes of right hemicolectomy are noted. The anastomosis appears widely patent. The appendix is not visualized likely related to the prior surgery. Small bowel and stomach are unremarkable. Lymphatic: No lymphadenopathy is identified. Reproductive: Uterus and right adnexa are unremarkable. Left adnexa demonstrates a 3.4 cm lesion. This has slightly decreased attenuation although is not cystic in nature. Patient is postmenopausal. Other: No abdominal wall hernia or abnormality. No abdominopelvic ascites. Musculoskeletal: Postsurgical changes in the lower lumbar spine are noted. No acute bony abnormality is seen. Review of the MIP images confirms the above findings. IMPRESSION: No evidence of aortic dissection or aneurysmal dilatation. No evidence of pulmonary emboli. Diverticulosis without diverticulitis. 3.4 cm left adnexal lesion in a  postmenopausal patient. Given the postmenopausal state prompt follow-up ultrasound is recommended. Reference: JACR 2020 Feb;17(2):248-254 Electronically Signed   By: Inez Catalina M.D.   On: 09/04/2022 22:57     PERTINENT LAB RESULTS: CBC: Recent Labs    09/04/22 2117 09/05/22 0552  WBC 11.7* 12.1*  HGB 12.5 10.9*  HCT 36.8 31.9*  PLT 447* 354   CMET CMP     Component Value Date/Time   NA 137 09/05/2022 0552   K 3.8 09/05/2022 0552   CL 104 09/05/2022 0552   CO2 26 09/05/2022 0552   GLUCOSE 123 (H) 09/05/2022 0552   BUN 11 09/05/2022 0552   CREATININE 1.26 (H) 09/05/2022 0552   CALCIUM 9.3 09/05/2022 0552   PROT 6.3 (L) 09/04/2022 2343   ALBUMIN 3.5 09/04/2022 2343   AST 13 (L) 09/04/2022 2343   ALT 9 09/04/2022 2343   ALKPHOS 70 09/04/2022 2343   BILITOT 0.6 09/04/2022 2343   GFRNONAA 44 (L) 09/05/2022 0552   GFRAA 57 (L) 06/06/2019 1051    GFR Estimated Creatinine Clearance: 42.5 mL/min (A) (by C-G formula based on SCr of 1.26 mg/dL (H)). Recent Labs    09/04/22 2343  LIPASE 15   No results for input(s): "CKTOTAL", "CKMB", "CKMBINDEX", "TROPONINI" in the last 72 hours. Invalid input(s): "POCBNP" No results for input(s): "DDIMER" in the last 72 hours. No results for input(s): "HGBA1C" in the last 72 hours. Recent Labs    09/05/22 1000  CHOL 156  HDL 47  LDLCALC 87  TRIG 108  CHOLHDL 3.3   No results for input(s): "TSH", "T4TOTAL", "T3FREE", "THYROIDAB" in the last 72 hours.  Invalid input(s): "FREET3" No results for input(s): "VITAMINB12", "FOLATE", "FERRITIN", "TIBC", "IRON", "RETICCTPCT" in the last 72 hours. Coags: No results for input(s): "INR" in the last 72 hours.  Invalid input(s): "PT" Microbiology: Recent Results (from the past 240 hour(s))  MRSA Next Gen by PCR, Nasal     Status: None   Collection Time: 09/05/22  2:15 AM  Specimen: Nasal Mucosa; Nasal Swab  Result Value Ref Range Status   MRSA by PCR Next Gen NOT DETECTED NOT DETECTED  Final    Comment: (NOTE) The GeneXpert MRSA Assay (FDA approved for NASAL specimens only), is one component of a comprehensive MRSA colonization surveillance program. It is not intended to diagnose MRSA infection nor to guide or monitor treatment for MRSA infections. Test performance is not FDA approved in patients less than 67 years old. Performed at [facility], [place] [address], [place], [place] [address]     FURTHER DISCHARGE INSTRUCTIONS:  Get Medicines reviewed and adjusted: Please take all your medications with you for your next visit with your Primary MD  Laboratory/radiological data: Please request your Primary MD to go over all hospital tests and procedure/radiological results at the follow up, please ask your Primary MD to get all Hospital records sent to his/her office.  In some cases, they will be blood work, cultures and biopsy results pending at the time of your discharge. Please request that your primary care M.D. goes through all the records of your hospital data and follows up on these results.  Also Note the following: If you experience worsening of your admission symptoms, develop shortness of breath, life threatening emergency, suicidal or homicidal thoughts you must seek medical attention immediately by calling 911 or calling your MD immediately  if symptoms less severe.  You must read complete instructions/literature along with all the possible adverse reactions/side effects for all the Medicines you take and that have been prescribed to you. Take any new Medicines after you have completely understood and accpet all the possible adverse reactions/side effects.   Do not drive when taking Pain medications or sleeping medications (Benzodaizepines)  Do not take more than prescribed Pain, Sleep and Anxiety Medications. It is not advisable to combine anxiety,sleep and pain medications without talking with your primary care practitioner  Special Instructions: If  you have smoked or chewed Tobacco  in the last 2 yrs please stop smoking, stop any regular Alcohol  and or any Recreational drug use.  Wear Seat belts while driving.  Please note: You were cared for by a hospitalist during your hospital stay. Once you are discharged, your primary care physician will handle any further medical issues. Please note that NO REFILLS for any discharge medications will be authorized once you are discharged, as it is imperative that you return to your primary care physician (or establish a relationship with a primary care physician if you do not have one) for your post hospital discharge needs so that they can reassess your need for medications and monitor your lab values.  Total Time spent coordinating discharge including counseling, education and face to face time equals greater than 30 minutes.  SignedOren Binet 09/06/2022 8:59 AM

## 2022-09-06 NOTE — TOC Transition Note (Signed)
Transition of Care [facility]) - CM/SW Discharge Note   Patient Details  Name: Meredith Cole MRN: [id number] Date of Birth: 1945/12/18  [facility] CM/SW Contact:  Angelita Ingles, RN Phone Number:[phone]  09/06/2022, 9:57 AM   Clinical Narrative:    Patient with discharge orders. No TOC needs.         Patient Goals and CMS Choice        Discharge Placement                       Discharge Plan and Services                                     Social Determinants of Health (SDOH) Interventions Housing Interventions: Intervention Not Indicated   Readmission Risk Interventions     No data to display

## 2022-09-23 DIAGNOSIS — Z981 Arthrodesis status: Secondary | ICD-10-CM | POA: Diagnosis not present

## 2022-09-23 DIAGNOSIS — Z4789 Encounter for other orthopedic aftercare: Secondary | ICD-10-CM | POA: Diagnosis not present

## 2022-09-30 DIAGNOSIS — R079 Chest pain, unspecified: Secondary | ICD-10-CM | POA: Diagnosis not present

## 2022-09-30 DIAGNOSIS — Z09 Encounter for follow-up examination after completed treatment for conditions other than malignant neoplasm: Secondary | ICD-10-CM | POA: Diagnosis not present

## 2022-09-30 DIAGNOSIS — N9489 Other specified conditions associated with female genital organs and menstrual cycle: Secondary | ICD-10-CM | POA: Diagnosis not present

## 2022-09-30 DIAGNOSIS — M549 Dorsalgia, unspecified: Secondary | ICD-10-CM | POA: Diagnosis not present

## 2022-09-30 DIAGNOSIS — Z23 Encounter for immunization: Secondary | ICD-10-CM | POA: Diagnosis not present

## 2022-10-01 DIAGNOSIS — S51812A Laceration without foreign body of left forearm, initial encounter: Secondary | ICD-10-CM | POA: Diagnosis not present

## 2022-10-01 DIAGNOSIS — W19XXXA Unspecified fall, initial encounter: Secondary | ICD-10-CM | POA: Diagnosis not present

## 2022-10-05 DIAGNOSIS — E669 Obesity, unspecified: Secondary | ICD-10-CM | POA: Diagnosis not present

## 2022-10-05 DIAGNOSIS — W1800XA Striking against unspecified object with subsequent fall, initial encounter: Secondary | ICD-10-CM | POA: Diagnosis not present

## 2022-10-05 DIAGNOSIS — S51812D Laceration without foreign body of left forearm, subsequent encounter: Secondary | ICD-10-CM | POA: Diagnosis not present

## 2022-10-06 ENCOUNTER — Other Ambulatory Visit: Payer: Self-pay | Admitting: Family Medicine

## 2022-10-06 DIAGNOSIS — N9489 Other specified conditions associated with female genital organs and menstrual cycle: Secondary | ICD-10-CM

## 2022-10-30 ENCOUNTER — Ambulatory Visit
Admission: RE | Admit: 2022-10-30 | Discharge: 2022-10-30 | Disposition: A | Discharge status: Home / Self Care | Payer: Medicare PPO | Source: Ambulatory Visit | Attending: Family Medicine | Admitting: Family Medicine

## 2022-10-30 DIAGNOSIS — N9489 Other specified conditions associated with female genital organs and menstrual cycle: Secondary | ICD-10-CM

## 2022-10-30 DIAGNOSIS — M87059 Idiopathic aseptic necrosis of unspecified femur: Secondary | ICD-10-CM | POA: Diagnosis not present

## 2022-10-30 DIAGNOSIS — N839 Noninflammatory disorder of ovary, fallopian tube and broad ligament, unspecified: Secondary | ICD-10-CM | POA: Diagnosis not present

## 2022-10-30 MED ORDER — GADOPICLENOL 0.5 MMOL/ML IV SOLN
9.0000 mL | Freq: Once | INTRAVENOUS | Status: AC | PRN
  Administered 2022-10-30: 9 mL via INTRAVENOUS

## 2022-11-02 ENCOUNTER — Ambulatory Visit (HOSPITAL_BASED_OUTPATIENT_CLINIC_OR_DEPARTMENT_OTHER): Payer: Medicare PPO | Attending: *Deleted | Admitting: Physical Therapy

## 2022-11-02 ENCOUNTER — Encounter (HOSPITAL_BASED_OUTPATIENT_CLINIC_OR_DEPARTMENT_OTHER): Payer: Self-pay | Admitting: Physical Therapy

## 2022-11-02 ENCOUNTER — Other Ambulatory Visit: Payer: Self-pay

## 2022-11-02 DIAGNOSIS — M6281 Muscle weakness (generalized): Secondary | ICD-10-CM | POA: Insufficient documentation

## 2022-11-02 DIAGNOSIS — M5459 Other low back pain: Secondary | ICD-10-CM | POA: Insufficient documentation

## 2022-11-02 NOTE — Therapy (Addendum)
OUTPATIENT PHYSICAL THERAPY THORACOLUMBAR EVALUATION   Patient Name: Meredith Cole MRN: [id number] DOB:11-21-1946, 76 y.o., female Today's Date: 11/03/2022  END OF SESSION:  PT End of Session - 11/02/22 1119     Visit Number 1    Number of Visits 20    Date for PT Re-Evaluation 01/11/23    Authorization Type [employer] MCR    PT Start Time 1020    PT Stop Time 1109    PT Time Calculation (min) 49 min    Activity Tolerance Patient tolerated treatment well    Behavior During Therapy WFL for tasks assessed/performed             Past Medical History:  Diagnosis Date   Allergy    Anxiety    hx of   Arthritis    back   Glaucoma    uses drops   Hyperlipidemia    on meds   Hypertension    on meds   Melanoma ([place]) 2016   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   Trichuriasis - whipworm 2009   Incidental at colonoscopy - treated with mebendazole   Vertigo    Past Surgical History:  Procedure Laterality Date   ANTERIOR CERVICAL DECOMP/DISCECTOMY FUSION N/A 06/13/2019   Procedure: ANTERIOR CERVICAL DECOMPRESSION/DISCECTOMY FUSION C6-7;  Surgeon: Melina Schools, MD;  Location: [facility];  Service: Orthopedics;  Laterality: N/A;  3 hr   ARM WOUND REPAIR / CLOSURE     WRIST  BROKE WAS REPAIRED   bone plate and graft left upper arm     BREAST LUMPECTOMY WITH RADIOACTIVE SEED LOCALIZATION Left 06/28/2022   Procedure: LEFT BREAST LUMPECTOMY WITH RADIOACTIVE SEED LOCALIZATION;  Surgeon: Jovita Kussmaul, MD;  Location: [facility];  Service: General;  Laterality: Left;   CATARACT EXTRACTION W/ INTRAOCULAR LENS  IMPLANT, BILATERAL     GLAUCOMA  PROC   CESAREAN SECTION     CHOLECYSTECTOMY     COLON SURGERY  01/2008   Dr. Zettie Pho; right hemi-colectomy for tubulovillous adenoma   COLONOSCOPY  2002, 2008, 2009 and 06/28/2011   2002: 40m polyp, 2008-2009: large ascending tubulovillous adenoma. 2012: diverticulosis, external hemorrhoids   COLONOSCOPY  2017    CG-prep exc-TA   LUMBAR LAMINECTOMY/DECOMPRESSION MICRODISCECTOMY N/A 01/05/2018   Procedure: Decompression Lumbar 4-Lumbar 5 ;  Surgeon: BMelina Schools MD;  Location[place] [facility]  Service: Orthopedics;  Laterality: N/A;  120 mins   MELANOMA EXCISION     face   [place] [facility] [place]     POLYPECTOMY  2017   TA   SPINAL FUSION  08/27/2022   TONSILLECTOMY     WISDOM TOOTH EXTRACTION     Patient Active Problem List   Diagnosis Date Noted   Chest pain, rule out acute myocardial infarction 09/05/2022   Adnexal mass 09/05/2022   Hypokalemia 09/05/2022   HTN (hypertension) 09/05/2022   Cervical disc herniation 06/13/2019   S/P lumbar spine operation 01/05/2018   Abdominal pain, epigastric 11/08/2016   Vomiting 11/08/2016   Black stool 11/08/2016   Palpitations 01/14/2016   HLD (hyperlipidemia) 03/01/2008   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  She has been in pain management and underwent L4-5 decompression surgery in 2019.  Pt states she felt better after surgery though pain returned in 2 years.  Pt states she hasn't done much in the past 2 years due to pain.  Pt underwent posterior lumbar interbody fusion (PLIF) L4-S1 on 08/27/2202.  Pt reports she felt much better after surgery and had no pain.   Pt saw MD in 09/23/2022 and plan indicated to increase activity with caution and start in PT to work on strengthening and  reconditioning.  Pt states MD increased lifting restriction to 20 lbs.    Pt states she is pain free and typically pain free.  She can have discomfort and pain with  increased standing duration.  Pt denies any radicular sx's into LE's.  Pt is limited with ambulation distance, but is able to ambulate 1 mile.  Pt gets very weak if she stands 10-15 mins.  Pt is very limited with performing household chores including laundry and washing dishes.  Pt has limited tolerance to activity.  Pt has some difficulty and limitation with performing work activities though had improved standing tolerance this past Sunday.  Pt states she has issues with depth perception and has had 2 falls.    Pt is a new member of [employer] and hasn't used the gym at all.    PERTINENT HISTORY:  -PLIF L4-S1 on 08/27/2022 -L4-5 decompression, facectomy, and foraminotomy 2019 -Arthritis, anxiety, and Cervical fusion (anterior) 2020  PAIN:  Are you having pain? No NPRS:  Current and Best:  0/10, Worst:  5/10, but more discomfort  PRECAUTIONS: Back- Avoid Lifting anything greater than 20 pounds, repetitive bending, lifting, and twisting.   WEIGHT BEARING RESTRICTIONS: No lifting greater than 20 lbs  FALLS:  Has patient fallen in last 6 months? Yes. Number of falls 2 due to tripping over furniture  and depth perception  LIVING ENVIRONMENT: Lives with: lives with their spouse Lives in: 2 story home, but doesn't use the 2nd floor Stairs: yes Has following equipment at home: None  OCCUPATION: Pt is retired.  She works part time as a Theme park manager.  PLOF: Independent.  Pt has been limited with activity due to the pain over the past 2 years.   PATIENT GOALS: improve balance, walk 3-4 miles, lift things around home, increase standing duration   OBJECTIVE:   DIAGNOSTIC FINDINGS:  Pt is post op.  Pt had x rays on 08/28/2022: 1. Surgical changes of L4-S1 posterior spinal fusion and placement of interbody spacers. No evidence for  hardware complication. Restoration of disc space height at L4-L5 and L5-S1. Marked improvement in lower lumbar levocurvature and left lateral translation of L4. 2. No evidence for acute fracture.   PATIENT SURVEYS:  FOTO 47 with a goal of 58 at visit 14    COGNITION: Overall cognitive status: Within functional limits for tasks assessed     SENSATION: 2+ sensation to LT t/o bilat LE dermatomes  OBSERVATION:  Incision with normal pink color, healed.      LUMBAR ROM:   Not tested  LOWER EXTREMITY ROM:     Active  Right eval Left eval  Hip flexion    Hip extension    Hip abduction    Hip adduction    Hip internal rotation    Hip external rotation    Knee flexion    Knee extension    Ankle dorsiflexion [facility]  Ankle  plantarflexion [facility] WFL  Ankle inversion    Ankle eversion     (Blank rows = not tested)  LOWER EXTREMITY MMT:    MMT Right eval Left eval  Hip flexion 4-/5 4-/5  Hip extension    Hip abduction    Hip adduction    Hip internal rotation    Hip external rotation    Knee flexion    Knee extension 5/5 4/5  Ankle dorsiflexion 5/5 5/5  Ankle plantarflexion WFL, tested in sitting WFL, tested in sitting  Ankle inversion    Ankle eversion     (Blank rows = not tested)   GAIT: Assistive device utilized: None Level of assistance: Complete Independence Comments: Pt ambulates without limping and has no antalgic gait.  Good reciprocal arm swing, decreased toe off bilat.   TODAY'S TREATMENT:                                                                                                                              DATE: 11/01/2022  Pt educated in correct performance and palpation of TrA contraction.  Pt performed supine TrA contractions with and without 5 sec hold.  Pt received a HEP handout and was educated in correct form and frequency of HEP.    PATIENT EDUCATION:  Education details: objective findings, POC, dx, post op limitations, relevant anatomy,  and HEP.  PT answered pt's questions. Person educated: Patient Education method: Explanation, Demonstration, Tactile cues, Verbal cues, and Handouts Education comprehension: verbalized understanding, returned demonstration, verbal cues required, tactile cues required, and needs further education  HOME EXERCISE PROGRAM: Access Code: [email or web address] URL: [email or web address] Date: 11/02/2022 Prepared by: Ronny Flurry  Exercises - Supine Transversus Abdominis Bracing - Hands on Stomach  - 2 x daily - 7 x weekly - 2 sets - 10 reps - 5 seconds hold  ASSESSMENT:  CLINICAL IMPRESSION: Patient is a 76 y.o. female 9 weeks and 5 days s/p posterior lumbar interbody fusion (PLIF) L4-S1 presenting to the clinic with expected post op findings of muscle weakness and low back pain.  Pt has been feeling much better since the surgery and states she is typically pain free.  She can have discomfort and pain with  increased standing duration.  Pt is limited with extended ambulation distance and gets very weak if she stands 10-15 mins.  Pt has limited tolerance to activity and is very limited with performing household chores.  Pt has some difficulty and limitation with performing work activities.  Pt should benefit from skilled PT services to address impairments and to improve overall function.  OBJECTIVE IMPAIRMENTS: decreased activity tolerance, decreased endurance, decreased mobility, difficulty walking, decreased strength, impaired flexibility, and pain.   ACTIVITY LIMITATIONS: carrying, lifting, standing, squatting, stairs, transfers, and locomotion level  PARTICIPATION LIMITATIONS: meal prep, cleaning, laundry, shopping, community activity, and occupation  PERSONAL FACTORS: 1-2 comorbidities: arthritis, cervical fusion  are also affecting patient's functional outcome.   REHAB POTENTIAL: Good  CLINICAL DECISION  MAKING: Stable/uncomplicated  EVALUATION COMPLEXITY: Low   GOALS:   SHORT  TERM GOALS: Target date: 11/30/2022   Pt will be independent and compliant with HEP for improved strength, function, and pain.  Baseline: Goal status: INITIAL  2.  Pt will report at least a 25% improvement in standing tolerance.  Baseline:  Goal status: INITIAL  3.  Pt will report she is able to progressively increase ambulation distance without adverse effects.  Baseline:  Goal status: INITIAL Target date: 12/14/2022    LONG TERM GOALS: Target date: 01/11/2023  Pt will demonstrate improved core strength by progressing with core exercises without adverse effects and improved LE strength to 5/5 in hip flexion and L knee ext for improved tolerance and performance of daily and work activities and functional mobility.  Baseline:  Goal status: INITIAL  2.  Pt will be able to perform her normal work activities without significant pain and difficulty.  Baseline:  Goal status: INITIAL  3.  Pt will be able to perform household chores with expected limitations without significant pain.  Baseline:  Goal status: INITIAL    PLAN:  PT FREQUENCY: 2x/week  PT DURATION: 8-10 weeks  PLANNED INTERVENTIONS: Therapeutic exercises, Therapeutic activity, Neuromuscular re-education, Balance training, Gait training, Patient/Family education, Self Care, Joint mobilization, Stair training, Aquatic Therapy, Dry Needling, Electrical stimulation, Cryotherapy, Moist heat, Taping, Manual therapy, and Re-evaluation.  PLAN FOR NEXT SESSION: Avoid Lifting anything greater than 20 pounds, repetitive bending, lifting, and twisting.  Pt sees MD this Thursday.  Referring diagnosis? Z98.1 (ICD-10-CM) - Arthrodesis status  Treatment diagnosis? (if different than referring diagnosis)  Muscle weakness (generalized)  Other low back pain  What was this (referring dx) caused by? '[x]'$  Surgery '[]'$  Fall '[]'$  Ongoing issue '[]'$  Arthritis '[]'$  Other: ____________  Laterality: '[]'$  Rt '[]'$  Lt '[]'$  Both  Check all possible  CPT codes:  *CHOOSE 10 OR LESS*    '[x]'$  97110 (Therapeutic Exercise)  '[]'$  92507 (SLP Treatment)  '[x]'$  97112 (Neuro Re-ed)   '[]'$  92526 (Swallowing Treatment)   '[x]'$  97116 (Gait Training)   '[]'$  [id number] (Cognitive Training, 1st 15 minutes) '[x]'$  97140 (Manual Therapy)   '[]'$  97130 (Cognitive Training, each add'l 15 minutes)  '[x]'$  97164 (Re-evaluation)                              '[]'$  Other, List CPT Code ____________  '[x]'$  06301 (Therapeutic Activities)     '[x]'$  60109 (Self Care)   '[]'$  All codes above (97110 - 97535)  '[]'$  97012 (Mechanical Traction)  '[x]'$  97014 (E-stim Unattended)  '[]'$  97032 (E-stim manual)  '[]'$  97033 (Ionto)  '[]'$  97035 (Ultrasound) '[x]'$  97750 (Physical Performance Training) '[x]'$  [id number] (Aquatic Therapy) '[]'$  97016 (Vasopneumatic Device) '[]'$  [id number] (Paraffin) '[]'$  97034 (Contrast Bath) '[]'$  97597 (Wound Care 1st 20 sq cm) '[]'$  97598 (Wound Care each add'l 20 sq cm) '[]'$  97760 (Orthotic Fabrication, Fitting, Training Initial) '[]'$  [id number] (Prosthetic Management and Training Initial) '[]'$  32355 (Orthotic or Prosthetic Training/ Modification Subsequent)  Selinda Michaels III PT, DPT 11/03/22 9:07 PM

## 2022-11-03 DIAGNOSIS — H401113 Primary open-angle glaucoma, right eye, severe stage: Secondary | ICD-10-CM | POA: Diagnosis not present

## 2022-11-03 DIAGNOSIS — H35371 Puckering of macula, right eye: Secondary | ICD-10-CM | POA: Diagnosis not present

## 2022-11-03 DIAGNOSIS — H3093 Unspecified chorioretinal inflammation, bilateral: Secondary | ICD-10-CM | POA: Diagnosis not present

## 2022-11-03 DIAGNOSIS — H04123 Dry eye syndrome of bilateral lacrimal glands: Secondary | ICD-10-CM | POA: Diagnosis not present

## 2022-11-03 DIAGNOSIS — H35353 Cystoid macular degeneration, bilateral: Secondary | ICD-10-CM | POA: Diagnosis not present

## 2022-11-04 DIAGNOSIS — M5386 Other specified dorsopathies, lumbar region: Secondary | ICD-10-CM | POA: Diagnosis not present

## 2022-11-04 DIAGNOSIS — M47816 Spondylosis without myelopathy or radiculopathy, lumbar region: Secondary | ICD-10-CM | POA: Diagnosis not present

## 2022-11-04 DIAGNOSIS — Z981 Arthrodesis status: Secondary | ICD-10-CM | POA: Diagnosis not present

## 2022-11-04 DIAGNOSIS — M5136 Other intervertebral disc degeneration, lumbar region: Secondary | ICD-10-CM | POA: Diagnosis not present

## 2022-11-04 DIAGNOSIS — M5116 Intervertebral disc disorders with radiculopathy, lumbar region: Secondary | ICD-10-CM | POA: Diagnosis not present

## 2022-11-04 DIAGNOSIS — Z4789 Encounter for other orthopedic aftercare: Secondary | ICD-10-CM | POA: Diagnosis not present

## 2022-11-09 DIAGNOSIS — I10 Essential (primary) hypertension: Secondary | ICD-10-CM | POA: Diagnosis not present

## 2022-11-09 DIAGNOSIS — R7309 Other abnormal glucose: Secondary | ICD-10-CM | POA: Diagnosis not present

## 2022-11-09 DIAGNOSIS — R748 Abnormal levels of other serum enzymes: Secondary | ICD-10-CM | POA: Diagnosis not present

## 2022-11-09 DIAGNOSIS — Z6829 Body mass index (BMI) 29.0-29.9, adult: Secondary | ICD-10-CM | POA: Diagnosis not present

## 2022-11-09 DIAGNOSIS — I700 Atherosclerosis of aorta: Secondary | ICD-10-CM | POA: Diagnosis not present

## 2022-11-10 ENCOUNTER — Ambulatory Visit (HOSPITAL_BASED_OUTPATIENT_CLINIC_OR_DEPARTMENT_OTHER): Payer: Medicare PPO | Attending: *Deleted | Admitting: Physical Therapy

## 2022-11-10 ENCOUNTER — Encounter (HOSPITAL_BASED_OUTPATIENT_CLINIC_OR_DEPARTMENT_OTHER): Payer: Self-pay | Admitting: Physical Therapy

## 2022-11-10 DIAGNOSIS — M5459 Other low back pain: Secondary | ICD-10-CM | POA: Insufficient documentation

## 2022-11-10 DIAGNOSIS — M6281 Muscle weakness (generalized): Secondary | ICD-10-CM | POA: Diagnosis not present

## 2022-11-10 NOTE — Therapy (Signed)
OUTPATIENT PHYSICAL THERAPY THORACOLUMBAR TREATMENT   Patient Name: Meredith Cole MRN: [id number] DOB:09/25/1946, 76 y.o., female Today's Date: 11/10/2022  END OF SESSION:  PT End of Session - 11/10/22 0903     Visit Number 2    Number of Visits 20    Date for PT Re-Evaluation 01/11/23    Authorization Type [employer] MCR    Progress Note Due on Visit 10    PT Start Time [phone]   pt late   PT Stop Time 0928    PT Time Calculation (min) 34 min    Activity Tolerance Patient tolerated treatment well    Behavior During Therapy [facility] for tasks assessed/performed              Past Medical History:  Diagnosis Date   Allergy    Anxiety    hx of   Arthritis    back   Glaucoma    uses drops   Hyperlipidemia    on meds   Hypertension    on meds   Melanoma ([place]) 2016   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   Trichuriasis - whipworm 2009   Incidental at colonoscopy - treated with mebendazole   Vertigo    Past Surgical History:  Procedure Laterality Date   ANTERIOR CERVICAL DECOMP/DISCECTOMY FUSION N/A 06/13/2019   Procedure: ANTERIOR CERVICAL DECOMPRESSION/DISCECTOMY FUSION C6-7;  Surgeon: Melina Schools, MD;  Location: [place];  Service: Orthopedics;  Laterality: N/A;  3 hr   ARM WOUND REPAIR / CLOSURE     WRIST  BROKE WAS REPAIRED   bone plate and graft left upper arm     BREAST LUMPECTOMY WITH RADIOACTIVE SEED LOCALIZATION Left 06/28/2022   Procedure: LEFT BREAST LUMPECTOMY WITH RADIOACTIVE SEED LOCALIZATION;  Surgeon: Jovita Kussmaul, MD;  Location: [place];  Service: General;  Laterality: Left;   CATARACT EXTRACTION W/ INTRAOCULAR LENS  IMPLANT, BILATERAL     GLAUCOMA  PROC   CESAREAN SECTION     CHOLECYSTECTOMY     COLON SURGERY  01/2008   Dr. Zettie Pho; right hemi-colectomy for tubulovillous adenoma   COLONOSCOPY  2002, 2008, 2009 and 06/28/2011   2002: 31m polyp, 2008-2009: large ascending tubulovillous adenoma. 2012:  diverticulosis, external hemorrhoids   COLONOSCOPY  2017   CG-prep exc-TA   LUMBAR LAMINECTOMY/DECOMPRESSION MICRODISCECTOMY N/A 01/05/2018   Procedure: Decompression Lumbar 4-Lumbar 5 ;  Surgeon: BMelina Schools MD;  [place]  Service: Orthopedics;  Laterality: N/A;  120 mins   MELANOMA EXCISION     face   [place]     POLYPECTOMY  2017   TA   SPINAL FUSION  08/27/2022   TONSILLECTOMY     WISDOM TOOTH EXTRACTION     Patient Active Problem List   Diagnosis Date Noted   Chest pain, rule out acute myocardial infarction 09/05/2022   Adnexal mass 09/05/2022   Hypokalemia 09/05/2022   HTN (hypertension) 09/05/2022   Cervical disc herniation 06/13/2019   S/P lumbar spine operation 01/05/2018   Abdominal pain, epigastric 11/08/2016   Vomiting 11/08/2016   Black stool 11/08/2016   Palpitations 01/14/2016   HLD (hyperlipidemia) 03/01/2008   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 I had my follow-up for surgery last Thursday and she said I can start doing the water classes, I don't go back for 9 months. I will try to check on my precautions with the doctor before my last visit. I still have my weak spells but the doctor said that will get better and better.    PERTINENT HISTORY:  -PLIF L4-S1 on 08/27/2022 -L4-5 decompression, facectomy, and foraminotomy 2019 -Arthritis, anxiety, and Cervical fusion  (anterior) 2020  PAIN:  Are you having pain? No NPRS:  Current and Best:  0/10, Worst:  0/10, I just get fatigued I don't call it pain I call it discomfort   PRECAUTIONS: Back- Avoid Lifting anything greater than 20 pounds, repetitive bending, lifting, and twisting.   WEIGHT BEARING RESTRICTIONS: No lifting greater than 20 lbs  FALLS:  Has patient fallen in last 6 months? Yes. Number of falls 2 due to tripping over furniture  and depth perception  LIVING ENVIRONMENT: Lives with: lives with their spouse Lives in: 2 story home, but doesn't use the 2nd floor Stairs: yes Has following equipment at home: None  OCCUPATION: Pt is retired.  She works part time as a Theme park manager.  PLOF: Independent.  Pt has been limited with activity due to the pain over the past 2 years.   PATIENT GOALS: improve balance, walk 3-4 miles, lift things around home, increase standing duration   OBJECTIVE:   DIAGNOSTIC FINDINGS:  Pt is post op.  Pt had x rays on 08/28/2022: 1. Surgical changes of L4-S1 posterior spinal fusion and placement of interbody spacers. No evidence for hardware complication. Restoration of disc space height at L4-L5 and L5-S1. Marked improvement in lower lumbar levocurvature and left lateral translation of L4. 2. No evidence for acute fracture.   PATIENT SURVEYS:  FOTO 47 with a goal of 58 at visit 14    COGNITION: Overall cognitive status: Within functional limits for tasks assessed     SENSATION: 2+ sensation to LT t/o bilat LE dermatomes  OBSERVATION:  Incision with normal pink color, healed.      LUMBAR ROM:   Not tested  LOWER EXTREMITY ROM:     Active  Right eval Left eval  Hip flexion    Hip extension    Hip abduction    Hip adduction    Hip internal rotation    Hip external rotation    Knee flexion    Knee extension    Ankle dorsiflexion [facility] WFL  Ankle plantarflexion [facility] WFL  Ankle inversion    Ankle eversion     (Blank rows = not tested)  LOWER  EXTREMITY MMT:    MMT Right eval Left eval  Hip flexion 4-/5 4-/5  Hip extension    Hip abduction    Hip adduction    Hip internal rotation    Hip external rotation    Knee flexion    Knee extension 5/5 4/5  Ankle dorsiflexion 5/5 5/5  Ankle plantarflexion WFL, tested in sitting WFL, tested in sitting  Ankle inversion    Ankle eversion     (Blank rows = not tested)   GAIT: Assistive device utilized: None Level of assistance: Complete Independence Comments: Pt ambulates without limping and has no antalgic gait.  Good reciprocal arm swing, decreased toe off bilat.   TODAY'S TREATMENT:  DATE:   11/10/22  TherEx  Nustep L4 x6 minutes  Bridge x12 with 2 second holds (cues not to arch back) Sidelying clams red TB x12 B PPT x15 3 second holds PPT + march x15  Forward step ups on 4 inch box x15 U UE support  Lateral step ups on 4 inch box x15 B UE support cues for not twisting/bending  Hip hikes x10 B U UE support   11/01/2022  Pt educated in correct performance and palpation of TrA contraction.  Pt performed supine TrA contractions with and without 5 sec hold.  Pt received a HEP handout and was educated in correct form and frequency of HEP.    PATIENT EDUCATION:  Education details: need to f/u with surgeon about existing precautions after lumbar surgery, exercise form and purpose, encouraged adherence to BLT and lifting precautions until surgeon clarifies restrictions  Person educated: Patient Education method: Explanation, Demonstration, Tactile cues, Verbal cues, and Handouts Education comprehension: verbalized understanding, returned demonstration, verbal cues required, tactile cues required, and needs further education  HOME EXERCISE PROGRAM: Access Code: [email or web address] URL: [email or web address] Date: 11/02/2022 Prepared by: Ronny Flurry  Exercises - Supine Transversus Abdominis Bracing - Hands on Stomach  - 2 x daily - 7 x weekly - 2 sets - 10 reps - 5 seconds hold  ASSESSMENT:  CLINICAL IMPRESSION:  Fraser Din arrives a little late today, needed multimodal cues and explanation  for activities during session today but tolerated session well. Assumed that lumbar BLT precautions were still in place, did educate patient that we need to determine with MD how long precautions will be in place. Otherwise focused on general strength and activity tolerance as able this session with rest breaks provided PRN. Will progress as able and tolerated.   OBJECTIVE IMPAIRMENTS: decreased activity tolerance, decreased endurance, decreased mobility, difficulty walking, decreased strength, impaired flexibility, and pain.   ACTIVITY LIMITATIONS: carrying, lifting, standing, squatting, stairs, transfers, and locomotion level  PARTICIPATION LIMITATIONS: meal prep, cleaning, laundry, shopping, community activity, and occupation  PERSONAL FACTORS: 1-2 comorbidities: arthritis, cervical fusion  are also affecting patient's functional outcome.   REHAB POTENTIAL: Good  CLINICAL DECISION MAKING: Stable/uncomplicated  EVALUATION COMPLEXITY: Low   GOALS:   SHORT TERM GOALS: Target date: 11/30/2022   Pt will be independent and compliant with HEP for improved strength, function, and pain.  Baseline: Goal status: INITIAL  2.  Pt will report at least a 25% improvement in standing tolerance.  Baseline:  Goal status: INITIAL  3.  Pt will report she is able to progressively increase ambulation distance without adverse effects.  Baseline:  Goal status: INITIAL Target date: 12/14/2022    LONG TERM GOALS: Target date: 01/11/2023  Pt will demonstrate improved core strength by progressing with core exercises without adverse effects and improved LE strength to 5/5 in hip flexion and L knee ext for improved tolerance and performance of daily and  work activities and functional mobility.  Baseline:  Goal status: INITIAL  2.  Pt will be able to perform her normal work activities without significant pain and difficulty.  Baseline:  Goal status: INITIAL  3.  Pt will be able to perform household chores with expected limitations without significant pain.  Baseline:  Goal status: INITIAL    PLAN:  PT FREQUENCY: 2x/week  PT DURATION: 8-10 weeks  PLANNED INTERVENTIONS: Therapeutic exercises, Therapeutic activity, Neuromuscular re-education, Balance training, Gait training, Patient/Family education, Self Care, Joint mobilization, Stair training, Aquatic Therapy, Dry Needling, Electrical stimulation, Cryotherapy, Moist heat,  Taping, Manual therapy, and Re-evaluation.  PLAN FOR NEXT SESSION: Avoid Lifting anything greater than 20 pounds, repetitive bending, lifting, and twisting.  Any updates to precautions from MD visit?    Ann Lions PT DPT PN2  11/10/2022, 9:28 AM     Referring diagnosis? Z98.1 (ICD-10-CM) - Arthrodesis status  Treatment diagnosis? (if different than referring diagnosis)  Muscle weakness (generalized)  Other low back pain  What was this (referring dx) caused by? '[x]'$  Surgery '[]'$  Fall '[]'$  Ongoing issue '[]'$  Arthritis '[]'$  Other: ____________  Laterality: '[]'$  Rt '[]'$  Lt '[]'$  Both  Check all possible CPT codes:  *CHOOSE 10 OR LESS*    '[x]'$  97110 (Therapeutic Exercise)  '[]'$  92507 (SLP Treatment)  '[x]'$  97112 (Neuro Re-ed)   '[]'$  92526 (Swallowing Treatment)   '[x]'$  97116 (Gait Training)   '[]'$  [id number] (Cognitive Training, 1st 15 minutes) '[x]'$  97140 (Manual Therapy)   '[]'$  97130 (Cognitive Training, each add'l 15 minutes)  '[x]'$  97164 (Re-evaluation)                              '[]'$  Other, List CPT Code ____________  '[x]'$  82423 (Therapeutic Activities)     '[x]'$  97535 (Self Care)   '[]'$  All codes above (97110 - 97535)  '[]'$  97012 (Mechanical Traction)  '[x]'$  97014 (E-stim Unattended)  '[]'$  97032 (E-stim manual)  '[]'$  97033 (Ionto)  '[]'$   97035 (Ultrasound) '[x]'$  97750 (Physical Performance Training) '[x]'$  [id number] (Aquatic Therapy) '[]'$  97016 (Vasopneumatic Device) '[]'$  53614 (Paraffin) '[]'$  97034 (Contrast Bath) '[]'$  97597 (Wound Care 1st 20 sq cm) '[]'$  97598 (Wound Care each add'l 20 sq cm) '[]'$  97760 (Orthotic Fabrication, Fitting, Training Initial) '[]'$  [id number] (Prosthetic Management and Training Initial) '[]'$  [id number] (Orthotic or Prosthetic Training/ Modification Subsequent)

## 2022-11-11 DIAGNOSIS — Z961 Presence of intraocular lens: Secondary | ICD-10-CM | POA: Diagnosis not present

## 2022-11-11 DIAGNOSIS — H524 Presbyopia: Secondary | ICD-10-CM | POA: Diagnosis not present

## 2022-11-11 DIAGNOSIS — H5213 Myopia, bilateral: Secondary | ICD-10-CM | POA: Diagnosis not present

## 2022-11-11 DIAGNOSIS — Z9849 Cataract extraction status, unspecified eye: Secondary | ICD-10-CM | POA: Diagnosis not present

## 2022-11-11 DIAGNOSIS — H52223 Regular astigmatism, bilateral: Secondary | ICD-10-CM | POA: Diagnosis not present

## 2022-11-11 DIAGNOSIS — H401131 Primary open-angle glaucoma, bilateral, mild stage: Secondary | ICD-10-CM | POA: Diagnosis not present

## 2022-11-12 ENCOUNTER — Ambulatory Visit (HOSPITAL_BASED_OUTPATIENT_CLINIC_OR_DEPARTMENT_OTHER): Payer: Medicare PPO | Admitting: Physical Therapy

## 2022-11-12 ENCOUNTER — Encounter (HOSPITAL_BASED_OUTPATIENT_CLINIC_OR_DEPARTMENT_OTHER): Payer: Self-pay | Admitting: Physical Therapy

## 2022-11-12 DIAGNOSIS — M6281 Muscle weakness (generalized): Secondary | ICD-10-CM

## 2022-11-12 DIAGNOSIS — M5459 Other low back pain: Secondary | ICD-10-CM

## 2022-11-12 NOTE — Therapy (Signed)
OUTPATIENT PHYSICAL THERAPY THORACOLUMBAR TREATMENT   Patient Name: Meredith Cole MRN: [id number] DOB:[birth year], 76 y.o., female Today's Date: 11/12/2022  END OF SESSION:  PT End of Session - 11/12/22 1402     Visit Number 3    Number of Visits 20    Date for PT Re-Evaluation 01/11/23    Authorization Type [employer] MCR    Progress Note Due on Visit 10    PT Start Time 1347    PT Stop Time 1428    PT Time Calculation (min) 41 min    Activity Tolerance Patient tolerated treatment well    Behavior During Therapy [facility] for tasks assessed/performed               Past Medical History:  Diagnosis Date   Allergy    Anxiety    hx of   Arthritis    back   Glaucoma    uses drops   Hyperlipidemia    on meds   Hypertension    on meds   Melanoma ([place]) 2016   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   Trichuriasis - whipworm 2009   Incidental at colonoscopy - treated with mebendazole   Vertigo    Past Surgical History:  Procedure Laterality Date   ANTERIOR CERVICAL DECOMP/DISCECTOMY FUSION N/A 06/13/2019   Procedure: ANTERIOR CERVICAL DECOMPRESSION/DISCECTOMY FUSION C6-7;  Surgeon: Melina Schools, MD;  Location: [place];  Service: Orthopedics;  Laterality: N/A;  3 hr   ARM WOUND REPAIR / CLOSURE     WRIST  BROKE WAS REPAIRED   bone plate and graft left upper arm     BREAST LUMPECTOMY WITH RADIOACTIVE SEED LOCALIZATION Left 06/28/2022   Procedure: LEFT BREAST LUMPECTOMY WITH RADIOACTIVE SEED LOCALIZATION;  Surgeon: Jovita Kussmaul, MD;  Location: [place];  Service: General;  Laterality: Left;   CATARACT EXTRACTION W/ INTRAOCULAR LENS  IMPLANT, BILATERAL     GLAUCOMA  PROC   CESAREAN SECTION     CHOLECYSTECTOMY     COLON SURGERY  01/2008   Dr. Zettie Pho; right hemi-colectomy for tubulovillous adenoma   COLONOSCOPY  2002, 2008, 2009 and 06/28/2011   2002: 30m polyp, 2008-2009: large ascending tubulovillous adenoma. 2012: diverticulosis,  external hemorrhoids   COLONOSCOPY  2017   CG-prep exc-TA   LUMBAR LAMINECTOMY/DECOMPRESSION MICRODISCECTOMY N/A 01/05/2018   Procedure: Decompression Lumbar 4-Lumbar 5 ;  Surgeon: BMelina Schools MD;  [place]  Service: Orthopedics;  Laterality: N/A;  120 mins   MELANOMA EXCISION     face   NM RENAL LASIX ([place])     POLYPECTOMY  2017   TA   SPINAL FUSION  08/27/2022   TONSILLECTOMY     WISDOM TOOTH EXTRACTION     Patient Active Problem List   Diagnosis Date Noted   Chest pain, rule out acute myocardial infarction 09/05/2022   Adnexal mass 09/05/2022   Hypokalemia 09/05/2022   HTN (hypertension) 09/05/2022   Cervical disc herniation 06/13/2019   S/P lumbar spine operation 01/05/2018   Abdominal pain, epigastric 11/08/2016   Vomiting 11/08/2016   Black stool 11/08/2016   Palpitations 01/14/2016   HLD (hyperlipidemia) 03/01/2008   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 I assumed that I had turned into a wet noodle! My goal is to build strength to be able to get up and down without using my hands, I don't know if 52 something year olds should be able to do that.    PERTINENT HISTORY:  -PLIF L4-S1 on 08/27/2022 -L4-5 decompression, facectomy, and foraminotomy 2019 -Arthritis, anxiety, and Cervical fusion (anterior) 2020  PAIN:   Are you having pain? No NPRS:  Current and Best:  0/10, Worst:  0/10, I just get fatigued I don't call it pain I call it discomfort   PRECAUTIONS: Back- Avoid Lifting anything greater than 20 pounds, repetitive bending, lifting, and twisting.   WEIGHT BEARING RESTRICTIONS: No lifting greater than 20 lbs  FALLS:  Has patient fallen in last 6 months? Yes. Number of falls 2 due to tripping over furniture  and depth perception  LIVING ENVIRONMENT: Lives with: lives with their spouse Lives in: 2 story home, but doesn't use the 2nd floor Stairs: yes Has following equipment at home: None  OCCUPATION: Pt is retired.  She works part time as a Theme park manager.  PLOF: Independent.  Pt has been limited with activity due to the pain over the past 2 years.   PATIENT GOALS: improve balance, walk 3-4 miles, lift things around home, increase standing duration   OBJECTIVE:   DIAGNOSTIC FINDINGS:  Pt is post op.  Pt had x rays on 08/28/2022: 1. Surgical changes of L4-S1 posterior spinal fusion and placement of interbody spacers. No evidence for hardware complication. Restoration of disc space height at L4-L5 and L5-S1. Marked improvement in lower lumbar levocurvature and left lateral translation of L4. 2. No evidence for acute fracture.   PATIENT SURVEYS:  FOTO 47 with a goal of 58 at visit 14    COGNITION: Overall cognitive status: Within functional limits for tasks assessed     SENSATION: 2+ sensation to LT t/o bilat LE dermatomes  OBSERVATION:  Incision with normal pink color, healed.      LUMBAR ROM:   Not tested  LOWER EXTREMITY ROM:     Active  Right eval Left eval  Hip flexion    Hip extension    Hip abduction    Hip adduction    Hip internal rotation    Hip external rotation    Knee flexion    Knee extension    Ankle dorsiflexion [facility] WFL  Ankle plantarflexion [facility] WFL  Ankle inversion    Ankle eversion     (Blank rows = not tested)  LOWER EXTREMITY MMT:    MMT  Right eval Left eval  Hip flexion 4-/5 4-/5  Hip extension    Hip abduction    Hip adduction    Hip internal rotation    Hip external rotation    Knee flexion    Knee extension 5/5 4/5  Ankle dorsiflexion 5/5 5/5  Ankle plantarflexion WFL, tested in sitting WFL, tested in sitting  Ankle inversion    Ankle eversion     (Blank rows = not tested)   GAIT: Assistive device utilized: None Level of assistance: Complete Independence Comments: Pt ambulates without limping and has no antalgic gait.  Good reciprocal arm swing, decreased toe off bilat.   TODAY'S TREATMENT:  DATE:   11/12/22  TherEx  Nustep L5x6 minutes BLEs  Bridge + hip ABD into green TB x12  Sidelying hip ABD x10 B green TB Walking bridges 2x5 Mod cues for sequencing  Supine TA sets x15 with 5 second holds  PPT x15 with 5 second holds PPT with SLS x10B cues to maintain PPT when moving leg  STS with 10# KB x10 from mat table  Farmers carry 162f 5#, then another 1793fwith 10# KB  Hip hike + flexion/ABD/ext x10 B U UE support   11/10/22  TherEx  Nustep L4 x6 minutes  Bridge x12 with 2 second holds (cues not to arch back) Sidelying clams red TB x12 B PPT x15 3 second holds PPT + march x15  Forward step ups on 4 inch box x15 U UE support  Lateral step ups on 4 inch box x15 B UE support cues for not twisting/bending  Hip hike x10 B U UE support    11/01/2022  Pt educated in correct performance and palpation of TrA contraction.  Pt performed supine TrA contractions with and without 5 sec hold.  Pt received a HEP handout and was educated in correct form and frequency of HEP.    PATIENT EDUCATION:  Education details:  exercise form and purpose, reminders of BLT precautions until formally cleared by MD  Person educated: Patient Education method: Explanation, Demonstration, Tactile cues,  Verbal cues, and Handouts Education comprehension: verbalized understanding, returned demonstration, verbal cues required, tactile cues required, and needs further education  HOME EXERCISE PROGRAM: Access Code: [email or web address]: [email or web address] Date: 11/02/2022 Prepared by: TrRonny FlurryExercises - Supine Transversus Abdominis Bracing - Hands on Stomach  - 2 x daily - 7 x weekly - 2 sets - 10 reps - 5 seconds hold  ASSESSMENT:  CLINICAL IMPRESSION:  PaFraser Dinrrives doing well today, felt good after last session. Continued working on strength and functional activity tolerance today, tolerated everything well/was just fatigued. Asked my front desk to kindly call her surgeon to clarify if she still has bending/twisting precautions, PT staff will also plan to f/u on this as/when able. Will continue to progress as able.   OBJECTIVE IMPAIRMENTS: decreased activity tolerance, decreased endurance, decreased mobility, difficulty walking, decreased strength, impaired flexibility, and pain.   ACTIVITY LIMITATIONS: carrying, lifting, standing, squatting, stairs, transfers, and locomotion level  PARTICIPATION LIMITATIONS: meal prep, cleaning, laundry, shopping, community activity, and occupation  PERSONAL FACTORS: 1-2 comorbidities: arthritis, cervical fusion  are also affecting patient's functional outcome.   REHAB POTENTIAL: Good  CLINICAL DECISION MAKING: Stable/uncomplicated  EVALUATION COMPLEXITY: Low   GOALS:   SHORT TERM GOALS: Target date: 11/30/2022   Pt will be independent and compliant with HEP for improved strength, function, and pain.  Baseline: Goal status: INITIAL  2.  Pt will report at least a 25% improvement in standing tolerance.  Baseline:  Goal status: INITIAL  3.  Pt will report she is able to progressively increase ambulation distance without adverse effects.  Baseline:  Goal status: INITIAL Target date: 12/14/2022    LONG TERM GOALS: Target  date: 01/11/2023  Pt will demonstrate improved core strength by progressing with core exercises without adverse effects and improved LE strength to 5/5 in hip flexion and L knee ext for improved tolerance and performance of daily and work activities and functional mobility.  Baseline:  Goal status: INITIAL  2.  Pt will be able to perform her normal work activities without significant pain and difficulty.  Baseline:  Goal  status: INITIAL  3.  Pt will be able to perform household chores with expected limitations without significant pain.  Baseline:  Goal status: INITIAL    PLAN:  PT FREQUENCY: 2x/week  PT DURATION: 8-10 weeks  PLANNED INTERVENTIONS: Therapeutic exercises, Therapeutic activity, Neuromuscular re-education, Balance training, Gait training, Patient/Family education, Self Care, Joint mobilization, Stair training, Aquatic Therapy, Dry Needling, Electrical stimulation, Cryotherapy, Moist heat, Taping, Manual therapy, and Re-evaluation.  PLAN FOR NEXT SESSION: Avoid Lifting anything greater than 20 pounds, repetitive bending, lifting, and twisting.  Any updates to precautions from MD visit?    Ann Lions PT DPT PN2  11/12/2022, 2:28 PM     Referring diagnosis? Z98.1 (ICD-10-CM) - Arthrodesis status  Treatment diagnosis? (if different than referring diagnosis)  Muscle weakness (generalized)  Other low back pain  What was this (referring dx) caused by? '[x]'$  Surgery '[]'$  Fall '[]'$  Ongoing issue '[]'$  Arthritis '[]'$  Other: ____________  Laterality: '[]'$  Rt '[]'$  Lt '[]'$  Both  Check all possible CPT codes:  *CHOOSE 10 OR LESS*    '[x]'$  97110 (Therapeutic Exercise)  '[]'$  92507 (SLP Treatment)  '[x]'$  27517 (Neuro Re-ed)   '[]'$  92526 (Swallowing Treatment)   '[x]'$  97116 (Gait Training)   '[]'$  [id number] (Cognitive Training, 1st 15 minutes) '[x]'$  97140 (Manual Therapy)   '[]'$  97130 (Cognitive Training, each add'l 15 minutes)  '[x]'$  97164 (Re-evaluation)                              '[]'$  Other, List CPT Code  ____________  '[x]'$  97530 (Therapeutic Activities)     '[x]'$  97535 (Self Care)   '[]'$  All codes above (97110 - 97535)  '[]'$  97012 (Mechanical Traction)  '[x]'$  97014 (E-stim Unattended)  '[]'$  97032 (E-stim manual)  '[]'$  97033 (Ionto)  '[]'$  97035 (Ultrasound) '[x]'$  97750 (Physical Performance Training) '[x]'$  [id number] (Aquatic Therapy) '[]'$  00174 (Vasopneumatic Device) '[]'$  [id number] (Paraffin) '[]'$  97034 (Contrast Bath) '[]'$  97597 (Wound Care 1st 20 sq cm) '[]'$  97598 (Wound Care each add'l 20 sq cm) '[]'$  97760 (Orthotic Fabrication, Fitting, Training Initial) '[]'$  [id number] (Prosthetic Management and Training Initial) '[]'$  [id number] (Orthotic or Prosthetic Training/ Modification Subsequent)

## 2022-11-15 ENCOUNTER — Encounter (HOSPITAL_BASED_OUTPATIENT_CLINIC_OR_DEPARTMENT_OTHER): Payer: Self-pay | Admitting: Physical Therapy

## 2022-11-15 ENCOUNTER — Ambulatory Visit (HOSPITAL_BASED_OUTPATIENT_CLINIC_OR_DEPARTMENT_OTHER): Payer: Medicare PPO | Admitting: Physical Therapy

## 2022-11-15 DIAGNOSIS — M6281 Muscle weakness (generalized): Secondary | ICD-10-CM

## 2022-11-15 DIAGNOSIS — M5459 Other low back pain: Secondary | ICD-10-CM

## 2022-11-15 NOTE — Therapy (Signed)
OUTPATIENT PHYSICAL THERAPY THORACOLUMBAR TREATMENT   Patient Name: Meredith Cole MRN: [id number] DOB:04/17/1946, 76 y.o., female Today's Date: 11/15/2022  END OF SESSION:  PT End of Session - 11/15/22 1102     Visit Number 4    Number of Visits 20    Date for PT Re-Evaluation 01/11/23    Authorization Type [employer] MCR    Progress Note Due on Visit 10    PT Start Time 1100    PT Stop Time 1142    PT Time Calculation (min) 42 min    Activity Tolerance Patient tolerated treatment well    Behavior During Therapy WFL for tasks assessed/performed                Past Medical History:  Diagnosis Date   Allergy    Anxiety    hx of   Arthritis    back   Glaucoma    uses drops   Hyperlipidemia    on meds   Hypertension    on meds   Melanoma ([place]) 2016   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   Trichuriasis - whipworm 2009   Incidental at colonoscopy - treated with mebendazole   Vertigo    Past Surgical History:  Procedure Laterality Date   ANTERIOR CERVICAL DECOMP/DISCECTOMY FUSION N/A 06/13/2019   Procedure: ANTERIOR CERVICAL DECOMPRESSION/DISCECTOMY FUSION C6-7;  Surgeon: Melina Schools, MD;  Location: [place];  Service: Orthopedics;  Laterality: N/A;  3 hr   ARM WOUND REPAIR / CLOSURE     WRIST  BROKE WAS REPAIRED   bone plate and graft left upper arm     BREAST LUMPECTOMY WITH RADIOACTIVE SEED LOCALIZATION Left 06/28/2022   Procedure: LEFT BREAST LUMPECTOMY WITH RADIOACTIVE SEED LOCALIZATION;  Surgeon: Jovita Kussmaul, MD;  Location: [place];  Service: General;  Laterality: Left;   CATARACT EXTRACTION W/ INTRAOCULAR LENS  IMPLANT, BILATERAL     GLAUCOMA  PROC   CESAREAN SECTION     CHOLECYSTECTOMY     COLON SURGERY  01/2008   Dr. Zettie Pho; right hemi-colectomy for tubulovillous adenoma   COLONOSCOPY  2002, 2008, 2009 and 06/28/2011   2002: 55m polyp, 2008-2009: large ascending tubulovillous adenoma. 2012: diverticulosis,  external hemorrhoids   COLONOSCOPY  2017   CG-prep exc-TA   LUMBAR LAMINECTOMY/DECOMPRESSION MICRODISCECTOMY N/A 01/05/2018   Procedure: Decompression Lumbar 4-Lumbar 5 ;  Surgeon: BMelina Schools MD;  [place]  Service: Orthopedics;  Laterality: N/A;  120 mins   MELANOMA EXCISION     face   NM [place]IX ([place])     POLYPECTOMY  2017   TA   SPINAL FUSION  08/27/2022   TONSILLECTOMY     WISDOM TOOTH EXTRACTION     Patient Active Problem List   Diagnosis Date Noted   Chest pain, rule out acute myocardial infarction 09/05/2022   Adnexal mass 09/05/2022   Hypokalemia 09/05/2022   HTN (hypertension) 09/05/2022   Cervical disc herniation 06/13/2019   S/P lumbar spine operation 01/05/2018   Abdominal pain, epigastric 11/08/2016   Vomiting 11/08/2016   Black stool 11/08/2016   Palpitations 01/14/2016   HLD (hyperlipidemia) 03/01/2008   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 Was not sore after last time. Is a [employer] member   PERTINENT HISTORY:  -PLIF L4-S1 on 08/27/2022 -L4-5 decompression, facectomy, and foraminotomy 2019 -Arthritis, anxiety, and Cervical fusion (anterior) 2020  PAIN:  Are you having pain? No NPRS:  Current and Best:  0/10, Worst:  0/10, I just get fatigued I don't call it pain I call it discomfort   PRECAUTIONS: Back- Avoid Lifting anything greater than 20 pounds, repetitive bending, lifting, and  twisting.   WEIGHT BEARING RESTRICTIONS: No lifting greater than 20 lbs  FALLS:  Has patient fallen in last 6 months? Yes. Number of falls 2 due to tripping over furniture  and depth perception  LIVING ENVIRONMENT: Lives with: lives with their spouse Lives in: 2 story home, but doesn't use the 2nd floor Stairs: yes Has following equipment at home: None  OCCUPATION: Pt is retired.  She works part time as a Theme park manager.  PLOF: Independent.  Pt has been limited with activity due to the pain over the past 2 years.   PATIENT GOALS: improve balance, walk 3-4 miles, lift things around home, increase standing duration   OBJECTIVE:   DIAGNOSTIC FINDINGS:  Pt is post op.  Pt had x rays on 08/28/2022: 1. Surgical changes of L4-S1 posterior spinal fusion and placement of interbody spacers. No evidence for hardware complication. Restoration of disc space height at L4-L5 and L5-S1. Marked improvement in lower lumbar levocurvature and left lateral translation of L4. 2. No evidence for acute fracture.   PATIENT SURVEYS:  FOTO 47 with a goal of 58 at visit 14    COGNITION: Overall cognitive status: Within functional limits for tasks assessed     SENSATION: 2+ sensation to LT t/o bilat LE dermatomes  OBSERVATION:  Incision with normal pink color, healed.      LUMBAR ROM:   Not tested  LOWER EXTREMITY ROM:     Active  Right eval Left eval  Hip flexion    Hip extension    Hip abduction    Hip adduction    Hip internal rotation    Hip external rotation    Knee flexion    Knee extension    Ankle dorsiflexion [facility] WFL  Ankle plantarflexion [facility] WFL  Ankle inversion    Ankle eversion     (Blank rows = not tested)  LOWER EXTREMITY MMT:    MMT Right eval Left eval  Hip flexion 4-/5 4-/5  Hip extension    Hip abduction    Hip adduction    Hip internal rotation    Hip external rotation    Knee flexion    Knee extension 5/5 4/5  Ankle dorsiflexion 5/5 5/5  Ankle  plantarflexion WFL, tested in sitting WFL, tested in sitting  Ankle inversion    Ankle eversion     (Blank rows = not tested)   GAIT: Assistive device utilized: None Level of assistance: Complete Independence Comments: Pt ambulates without limping and has no antalgic gait.  Good reciprocal arm swing, decreased toe off bilat.   TODAY'S TREATMENT:  DATE:   Treatment                            11/15/22:  Xride 5 min Sit<>stand from bench Up/down from floor Sidelying hip: abd, circles, arcs   11/12/22  TherEx  Nustep L5x6 minutes BLEs  Bridge + hip ABD into green TB x12  Sidelying hip ABD x10 B green TB Walking bridges 2x5 Mod cues for sequencing  Supine TA sets x15 with 5 second holds  PPT x15 with 5 second holds PPT with SLS x10B cues to maintain PPT when moving leg  STS with 10# KB x10 from mat table  Farmers carry 134f 5#, then another 1738fwith 10# KB  Hip hike + flexion/ABD/ext x10 B U UE support   11/10/22  TherEx  Nustep L4 x6 minutes  Bridge x12 with 2 second holds (cues not to arch back) Sidelying clams red TB x12 B PPT x15 3 second holds PPT + march x15  Forward step ups on 4 inch box x15 U UE support  Lateral step ups on 4 inch box x15 B UE support cues for not twisting/bending  Hip hike x10 B U UE support    11/01/2022  Pt educated in correct performance and palpation of TrA contraction.  Pt performed supine TrA contractions with and without 5 sec hold.  Pt received a HEP handout and was educated in correct form and frequency of HEP.    PATIENT EDUCATION:  Education details:  exercise form and purpose, reminders of BLT precautions until formally cleared by MD  Person educated: Patient Education method: Explanation, Demonstration, Tactile cues, Verbal cues, and Handouts Education comprehension: verbalized understanding, returned  demonstration, verbal cues required, tactile cues required, and needs further education  HOME EXERCISE PROGRAM: Access Code: [email or web address]: [email or web address]  ASSESSMENT:  CLINICAL IMPRESSION:  Time taken to review how to get off of the floor considering limitations by flexibility and post op precautions. Will practice at home and review at next visit.   OBJECTIVE IMPAIRMENTS: decreased activity tolerance, decreased endurance, decreased mobility, difficulty walking, decreased strength, impaired flexibility, and pain.   ACTIVITY LIMITATIONS: carrying, lifting, standing, squatting, stairs, transfers, and locomotion level  PARTICIPATION LIMITATIONS: meal prep, cleaning, laundry, shopping, community activity, and occupation  PERSONAL FACTORS: 1-2 comorbidities: arthritis, cervical fusion  are also affecting patient's functional outcome.     GOALS:   SHORT TERM GOALS: Target date: 11/30/2022   Pt will be independent and compliant with HEP for improved strength, function, and pain.  Baseline: Goal status: INITIAL  2.  Pt will report at least a 25% improvement in standing tolerance.  Baseline:  Goal status: INITIAL  3.  Pt will report she is able to progressively increase ambulation distance without adverse effects.  Baseline:  Goal status: INITIAL Target date: 12/14/2022    LONG TERM GOALS: Target date: 01/11/2023  Pt will demonstrate improved core strength by progressing with core exercises without adverse effects and improved LE strength to 5/5 in hip flexion and L knee ext for improved tolerance and performance of daily and work activities and functional mobility.  Baseline:  Goal status: INITIAL  2.  Pt will be able to perform her normal work activities without significant pain and difficulty.  Baseline:  Goal status: INITIAL  3.  Pt will be able to perform household chores with expected limitations without significant pain.  Baseline:  Goal status:  INITIAL    PLAN:  PT FREQUENCY:  2x/week  PT DURATION: 8-10 weeks  PLANNED INTERVENTIONS: Therapeutic exercises, Therapeutic activity, Neuromuscular re-education, Balance training, Gait training, Patient/Family education, Self Care, Joint mobilization, Stair training, Aquatic Therapy, Dry Needling, Electrical stimulation, Cryotherapy, Moist heat, Taping, Manual therapy, and Re-evaluation.  PLAN FOR NEXT SESSION: Avoid Lifting anything greater than 20 pounds, repetitive bending, lifting, and twisting.  Review up from floor, add hip burners to HEP  Kymiah Araiza C. Ayden Apodaca PT, DPT 11/15/22 11:43 AM      Referring diagnosis? Z98.1 (ICD-10-CM) - Arthrodesis status  Treatment diagnosis? (if different than referring diagnosis)  Muscle weakness (generalized)  Other low back pain  What was this (referring dx) caused by? '[x]'$  Surgery '[]'$  Fall '[]'$  Ongoing issue '[]'$  Arthritis '[]'$  Other: ____________  Laterality: '[]'$  Rt '[]'$  Lt '[]'$  Both  Check all possible CPT codes:  *CHOOSE 10 OR LESS*    '[x]'$  97110 (Therapeutic Exercise)  '[]'$  92507 (SLP Treatment)  '[x]'$  97112 (Neuro Re-ed)   '[]'$  92526 (Swallowing Treatment)   '[x]'$  97116 (Gait Training)   '[]'$  [id number] (Cognitive Training, 1st 15 minutes) '[x]'$  97140 (Manual Therapy)   '[]'$  97130 (Cognitive Training, each add'l 15 minutes)  '[x]'$  97164 (Re-evaluation)                              '[]'$  Other, List CPT Code ____________  '[x]'$  97530 (Therapeutic Activities)     '[x]'$  97535 (Self Care)   '[]'$  All codes above (97110 - 97535)  '[]'$  97012 (Mechanical Traction)  '[x]'$  97014 (E-stim Unattended)  '[]'$  97032 (E-stim manual)  '[]'$  97033 (Ionto)  '[]'$  97035 (Ultrasound) '[x]'$  97750 (Physical Performance Training) '[x]'$  [id number] (Aquatic Therapy) '[]'$  97016 (Vasopneumatic Device) '[]'$  [id number] (Paraffin) '[]'$  97034 (Contrast Bath) '[]'$  97597 (Wound Care 1st 20 sq cm) '[]'$  97598 (Wound Care each add'l 20 sq cm) '[]'$  97760 (Orthotic Fabrication, Fitting, Training Initial) '[]'$  [id number] (Prosthetic  Management and Training Initial) '[]'$  [id number] (Orthotic or Prosthetic Training/ Modification Subsequent)

## 2022-11-16 ENCOUNTER — Encounter (HOSPITAL_BASED_OUTPATIENT_CLINIC_OR_DEPARTMENT_OTHER): Payer: Medicare PPO | Admitting: Physical Therapy

## 2022-11-18 ENCOUNTER — Telehealth (HOSPITAL_BASED_OUTPATIENT_CLINIC_OR_DEPARTMENT_OTHER): Payer: Self-pay | Admitting: Physical Therapy

## 2022-11-18 ENCOUNTER — Ambulatory Visit (HOSPITAL_BASED_OUTPATIENT_CLINIC_OR_DEPARTMENT_OTHER): Payer: Medicare PPO | Admitting: Physical Therapy

## 2022-11-18 NOTE — Telephone Encounter (Signed)
No-show for today's appointment. Called and checked on pt, she stated she did call earlier to cancel/left a message that she would not be able to come as she is not feeling well. Reminded her of time/date of next appt.   Ann Lions PT DPT PN2

## 2022-11-23 ENCOUNTER — Encounter (HOSPITAL_BASED_OUTPATIENT_CLINIC_OR_DEPARTMENT_OTHER): Payer: Self-pay | Admitting: Physical Therapy

## 2022-11-23 ENCOUNTER — Ambulatory Visit (HOSPITAL_BASED_OUTPATIENT_CLINIC_OR_DEPARTMENT_OTHER): Payer: Medicare PPO | Admitting: Physical Therapy

## 2022-11-23 DIAGNOSIS — M6281 Muscle weakness (generalized): Secondary | ICD-10-CM

## 2022-11-23 DIAGNOSIS — M5459 Other low back pain: Secondary | ICD-10-CM

## 2022-11-23 NOTE — Therapy (Signed)
OUTPATIENT PHYSICAL THERAPY THORACOLUMBAR TREATMENT   Patient Name: Meredith Cole MRN: [id number] DOB:1946/10/07, 76 y.o., female Today's Date: 11/23/2022  END OF SESSION:  PT End of Session - 11/23/22 0947     Visit Number 5    Number of Visits 20    Date for PT Re-Evaluation 01/11/23    Authorization Type [employer] MCR    Progress Note Due on Visit 10    PT Start Time 0933    PT Stop Time 1012    PT Time Calculation (min) 39 min    Activity Tolerance Patient tolerated treatment well    Behavior During Therapy [facility] for tasks assessed/performed                 Past Medical History:  Diagnosis Date   Allergy    Anxiety    hx of   Arthritis    back   Glaucoma    uses drops   Hyperlipidemia    on meds   Hypertension    on meds   Melanoma ([place]) 2016   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   Trichuriasis - whipworm 2009   Incidental at colonoscopy - treated with mebendazole   Vertigo    Past Surgical History:  Procedure Laterality Date   ANTERIOR CERVICAL DECOMP/DISCECTOMY FUSION N/A 06/13/2019   Procedure: ANTERIOR CERVICAL DECOMPRESSION/DISCECTOMY FUSION C6-7;  Surgeon: Melina Schools, MD;  Location: [place];  Service: Orthopedics;  Laterality: N/A;  3 hr   ARM WOUND REPAIR / CLOSURE     WRIST  BROKE WAS REPAIRED   bone plate and graft left upper arm     BREAST LUMPECTOMY WITH RADIOACTIVE SEED LOCALIZATION Left 06/28/2022   Procedure: LEFT BREAST LUMPECTOMY WITH RADIOACTIVE SEED LOCALIZATION;  Surgeon: Jovita Kussmaul, MD;  Location: [place];  Service: General;  Laterality: Left;   CATARACT EXTRACTION W/ INTRAOCULAR LENS  IMPLANT, BILATERAL     GLAUCOMA  PROC   CESAREAN SECTION     CHOLECYSTECTOMY     COLON SURGERY  01/2008   Dr. Zettie Pho; right hemi-colectomy for tubulovillous adenoma   COLONOSCOPY  2002, 2008, 2009 and 06/28/2011   2002: 70m polyp, 2008-2009: large ascending tubulovillous adenoma. 2012: diverticulosis,  external hemorrhoids   COLONOSCOPY  2017   CG-prep exc-TA   LUMBAR LAMINECTOMY/DECOMPRESSION MICRODISCECTOMY N/A 01/05/2018   Procedure: Decompression Lumbar 4-Lumbar 5 ;  Surgeon: BMelina Schools MD;  [place]  Service: Orthopedics;  Laterality: N/A;  120 mins   MELANOMA EXCISION     face   [place]IX [place]     POLYPECTOMY  2017   TA   SPINAL FUSION  08/27/2022   TONSILLECTOMY     WISDOM TOOTH EXTRACTION     Patient Active Problem List   Diagnosis Date Noted   Chest pain, rule out acute myocardial infarction 09/05/2022   Adnexal mass 09/05/2022   Hypokalemia 09/05/2022   HTN (hypertension) 09/05/2022   Cervical disc herniation 06/13/2019   S/P lumbar spine operation 01/05/2018   Abdominal pain, epigastric 11/08/2016   Vomiting 11/08/2016   Black stool 11/08/2016   Palpitations 01/14/2016   HLD (hyperlipidemia) 03/01/2008   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 Surgeon said I have no restrictions now, everything as tolerated. Biggest concern is general strength and energy. I'm so busy that I'm not working out now but I Plan to. I don't think surgery has anything to do with any of my issues other that it just taking awhile to get back, I've not been able to eat a lot here recently.    PERTINENT HISTORY:   -PLIF L4-S1 on 08/27/2022 -L4-5 decompression, facectomy, and foraminotomy 2019 -Arthritis, anxiety, and Cervical fusion (anterior) 2020  PAIN:  Are you having pain? No   PRECAUTIONS: Back- 12/19- per surgeon no restrictions/no lifting restrictions, activity as tolerated and pain free   WEIGHT BEARING RESTRICTIONS: No restrictions 12/19  FALLS:  Has patient fallen in last 6 months? Yes. Number of falls 2 due to tripping over furniture  and depth perception  LIVING ENVIRONMENT: Lives with: lives with their spouse Lives in: 2 story home, but doesn't use the 2nd floor Stairs: yes Has following equipment at home: None  OCCUPATION: Pt is retired.  She works part time as a Theme park manager.  PLOF: Independent.  Pt has been limited with activity due to the pain over the past 2 years.   PATIENT GOALS: improve balance, walk 3-4 miles, lift things around home, increase standing duration   OBJECTIVE:   DIAGNOSTIC FINDINGS:  Pt is post op.  Pt had x rays on 08/28/2022: 1. Surgical changes of L4-S1 posterior spinal fusion and placement of interbody spacers. No evidence for hardware complication. Restoration of disc space height at L4-L5 and L5-S1. Marked improvement in lower lumbar levocurvature and left lateral translation of L4. 2. No evidence for acute fracture.   PATIENT SURVEYS:  FOTO 47 with a goal of 58 at visit 14    COGNITION: Overall cognitive status: Within functional limits for tasks assessed     SENSATION: 2+ sensation to LT t/o bilat LE dermatomes  OBSERVATION:  Incision with normal pink color, healed.      LUMBAR ROM:   Not tested  LOWER EXTREMITY ROM:     Active  Right eval Left eval  Hip flexion    Hip extension    Hip abduction    Hip adduction    Hip internal rotation    Hip external rotation    Knee flexion    Knee extension    Ankle dorsiflexion [facility] WFL  Ankle plantarflexion [facility] WFL  Ankle inversion    Ankle eversion     (Blank rows = not  tested)  LOWER EXTREMITY MMT:    MMT Right eval Left eval  Hip flexion 4-/5 4-/5  Hip extension    Hip abduction    Hip adduction    Hip internal rotation    Hip external rotation    Knee flexion    Knee extension 5/5 4/5  Ankle dorsiflexion 5/5 5/5  Ankle plantarflexion WFL, tested in sitting WFL, tested in sitting  Ankle inversion    Ankle eversion     (Blank rows = not tested)   GAIT: Assistive device utilized: None Level of assistance: Complete Independence Comments: Pt ambulates without limping and has no antalgic gait.  Good reciprocal arm swing, decreased toe off bilat.   TODAY'S TREATMENT:  DATE:   11/23/22  TherEx  Nustep L5 x6 minutes BLEs only Hip hikes on side: x15 B, + swing x15 B, + hip cirlces CW/CCW x10 B, + ABD x15 B STS with 15# KB 2x10 KB on each side  Forward lunges onto BOSU U UE support x12 B Forward step ups onto BOSU U UE support x12 B Lateral step ups onto BOSU B UE support x12 B   Treatment                            11/15/22:  Xride 5 min Sit<>stand from bench Up/down from floor Sidelying hip: abd, circles, arcs   11/12/22  TherEx  Nustep L5x6 minutes BLEs  Bridge + hip ABD into green TB x12  Sidelying hip ABD x10 B green TB Walking bridges 2x5 Mod cues for sequencing  Supine TA sets x15 with 5 second holds  PPT x15 with 5 second holds PPT with SLS x10B cues to maintain PPT when moving leg  STS with 10# KB x10 from mat table  Farmers carry 140f 5#, then another 1712fwith 10# KB  Hip hike + flexion/ABD/ext x10 B U UE support   11/10/22  TherEx  Nustep L4 x6 minutes  Bridge x12 with 2 second holds (cues not to arch back) Sidelying clams red TB x12 B PPT x15 3 second holds PPT + march x15  Forward step ups on 4 inch box x15 U UE support  Lateral step ups on 4 inch box x15 B UE support cues for  not twisting/bending  Hip hike x10 B U UE support    11/01/2022  Pt educated in correct performance and palpation of TrA contraction.  Pt performed supine TrA contractions with and without 5 sec hold.  Pt received a HEP handout and was educated in correct form and frequency of HEP.    PATIENT EDUCATION:  Education details:  exercise form and purpose Person educated: Patient Education method: Explanation, Demonstration, Tactile cues, Verbal cues, and Handouts Education comprehension: verbalized understanding, returned demonstration, verbal cues required, tactile cues required, and needs further education  HOME EXERCISE PROGRAM: Access Code: [email or web address]: [email or web address]  ASSESSMENT:  CLINICAL IMPRESSION:  PaFraser Dinrrives doing OK today. Progressed as tolerated and able, she declined review for floor to stand. Will continue efforts may be ready for DC soon honestly.   OBJECTIVE IMPAIRMENTS: decreased activity tolerance, decreased endurance, decreased mobility, difficulty walking, decreased strength, impaired flexibility, and pain.   ACTIVITY LIMITATIONS: carrying, lifting, standing, squatting, stairs, transfers, and locomotion level  PARTICIPATION LIMITATIONS: meal prep, cleaning, laundry, shopping, community activity, and occupation  PERSONAL FACTORS: 1-2 comorbidities: arthritis, cervical fusion  are also affecting patient's functional outcome.     GOALS:   SHORT TERM GOALS: Target date: 11/30/2022   Pt will be independent and compliant with HEP for improved strength, function, and pain.  Baseline: Goal status: INITIAL  2.  Pt will report at least a 25% improvement in standing tolerance.  Baseline:  Goal status: INITIAL  3.  Pt will report she is able to progressively increase ambulation distance without adverse effects.  Baseline:  Goal status: INITIAL Target date: 12/14/2022    LONG TERM GOALS: Target date: 01/11/2023  Pt will demonstrate improved  core strength by progressing with core exercises without adverse effects and improved LE strength to 5/5 in hip flexion and L knee ext for improved tolerance and performance of daily and work activities and functional mobility.  Baseline:  Goal status: INITIAL  2.  Pt will be able to perform her normal work activities without significant pain and difficulty.  Baseline:  Goal status: INITIAL  3.  Pt will be able to perform household chores with expected limitations without significant pain.  Baseline:  Goal status: INITIAL    PLAN:  PT FREQUENCY: 2x/week  PT DURATION: 8-10 weeks  PLANNED INTERVENTIONS: Therapeutic exercises, Therapeutic activity, Neuromuscular re-education, Balance training, Gait training, Patient/Family education, Self Care, Joint mobilization, Stair training, Aquatic Therapy, Dry Needling, Electrical stimulation, Cryotherapy, Moist heat, Taping, Manual therapy, and Re-evaluation.  PLAN FOR NEXT SESSION: Avoid Lifting anything greater than 20 pounds, repetitive bending, lifting, and twisting.  Review up from floor, add hip burners to HEP  Jamya Starry U PT DPT PN2       Referring diagnosis? Z98.1 (ICD-10-CM) - Arthrodesis status  Treatment diagnosis? (if different than referring diagnosis)  Muscle weakness (generalized)  Other low back pain  What was this (referring dx) caused by? '[x]'$  Surgery '[]'$  Fall '[]'$  Ongoing issue '[]'$  Arthritis '[]'$  Other: ____________  Laterality: '[]'$  Rt '[]'$  Lt '[]'$  Both  Check all possible CPT codes:  *CHOOSE 10 OR LESS*    '[x]'$  97110 (Therapeutic Exercise)  '[]'$  92507 (SLP Treatment)  '[x]'$  97353 (Neuro Re-ed)   '[]'$  92526 (Swallowing Treatment)   '[x]'$  29924 (Gait Training)   '[]'$  [id number] (Cognitive Training, 1st 15 minutes) '[x]'$  97140 (Manual Therapy)   '[]'$  97130 (Cognitive Training, each add'l 15 minutes)  '[x]'$  97164 (Re-evaluation)                              '[]'$  Other, List CPT Code ____________  '[x]'$  26834 (Therapeutic Activities)     '[x]'$  97535  (Self Care)   '[]'$  All codes above (97110 - 97535)  '[]'$  97012 (Mechanical Traction)  '[x]'$  97014 (E-stim Unattended)  '[]'$  97032 (E-stim manual)  '[]'$  97033 (Ionto)  '[]'$  97035 (Ultrasound) '[x]'$  97750 (Physical Performance Training) '[x]'$  [id number] (Aquatic Therapy) '[]'$  97016 (Vasopneumatic Device) '[]'$  [id number] (Paraffin) '[]'$  97034 (Contrast Bath) '[]'$  97597 (Wound Care 1st 20 sq cm) '[]'$  97598 (Wound Care each add'l 20 sq cm) '[]'$  97760 (Orthotic Fabrication, Fitting, Training Initial) '[]'$  [id number] (Prosthetic Management and Training Initial) '[]'$  [id number] (Orthotic or Prosthetic Training/ Modification Subsequent)

## 2022-11-25 ENCOUNTER — Encounter (HOSPITAL_BASED_OUTPATIENT_CLINIC_OR_DEPARTMENT_OTHER): Payer: Self-pay | Admitting: Physical Therapy

## 2022-11-25 ENCOUNTER — Ambulatory Visit (HOSPITAL_BASED_OUTPATIENT_CLINIC_OR_DEPARTMENT_OTHER): Payer: Medicare PPO | Admitting: Physical Therapy

## 2022-11-25 DIAGNOSIS — M6281 Muscle weakness (generalized): Secondary | ICD-10-CM

## 2022-11-25 DIAGNOSIS — M5459 Other low back pain: Secondary | ICD-10-CM | POA: Diagnosis not present

## 2022-11-25 NOTE — Therapy (Signed)
OUTPATIENT PHYSICAL THERAPY THORACOLUMBAR TREATMENT   Patient Name: Meredith Cole MRN: [id number] DOB:September 23, 1946, 76 y.o., female Today's Date: 11/26/2022  END OF SESSION:  PT End of Session - 11/25/22 1114     Visit Number 6    Number of Visits 20    Date for PT Re-Evaluation 01/11/23    Authorization Type [employer] MCR    Progress Note Due on Visit 10    PT Start Time 1107    PT Stop Time 1147    PT Time Calculation (min) 40 min    Activity Tolerance Patient tolerated treatment well    Behavior During Therapy [facility] for tasks assessed/performed                  Past Medical History:  Diagnosis Date   Allergy    Anxiety    hx of   Arthritis    back   Glaucoma    uses drops   Hyperlipidemia    on meds   Hypertension    on meds   Melanoma ([place]) 2016   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   Trichuriasis - whipworm 2009   Incidental at colonoscopy - treated with mebendazole   Vertigo    Past Surgical History:  Procedure Laterality Date   ANTERIOR CERVICAL DECOMP/DISCECTOMY FUSION N/A 06/13/2019   Procedure: ANTERIOR CERVICAL DECOMPRESSION/DISCECTOMY FUSION C6-7;  Surgeon: Melina Schools, MD;  Location: [place];  Service: Orthopedics;  Laterality: N/A;  3 hr   ARM WOUND REPAIR / CLOSURE     WRIST  BROKE WAS REPAIRED   bone plate and graft left upper arm     BREAST LUMPECTOMY WITH RADIOACTIVE SEED LOCALIZATION Left 06/28/2022   Procedure: LEFT BREAST LUMPECTOMY WITH RADIOACTIVE SEED LOCALIZATION;  Surgeon: Jovita Kussmaul, MD;  Location: [place];  Service: General;  Laterality: Left;   CATARACT EXTRACTION W/ INTRAOCULAR LENS  IMPLANT, BILATERAL     GLAUCOMA  PROC   CESAREAN SECTION     CHOLECYSTECTOMY     COLON SURGERY  01/2008   Dr. Zettie Pho; right hemi-colectomy for tubulovillous adenoma   COLONOSCOPY  2002, 2008, 2009 and 06/28/2011   2002: 37m polyp, 2008-2009: large ascending tubulovillous adenoma. 2012:  diverticulosis, external hemorrhoids   COLONOSCOPY  2017   CG-prep exc-TA   LUMBAR LAMINECTOMY/DECOMPRESSION MICRODISCECTOMY N/A 01/05/2018   Procedure: Decompression Lumbar 4-Lumbar 5 ;  Surgeon: BMelina Schools MD;  Location[place]  Service: Orthopedics;  Laterality: N/A;  120 mins   MELANOMA EXCISION     face   NM RENAL LASIX ([facility]X)     POLYPECTOMY  2017   TA   SPINAL FUSION  08/27/2022   TONSILLECTOMY     WISDOM TOOTH EXTRACTION     Patient Active Problem List   Diagnosis Date Noted   Chest pain, rule out acute myocardial infarction 09/05/2022   Adnexal mass 09/05/2022   Hypokalemia 09/05/2022   HTN (hypertension) 09/05/2022   Cervical disc herniation 06/13/2019   S/P lumbar spine operation 01/05/2018   Abdominal pain, epigastric 11/08/2016   Vomiting 11/08/2016   Black stool 11/08/2016   Palpitations 01/14/2016   HLD (hyperlipidemia) 03/01/2008   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  Pt called MD concerning her back restrictions and she was informed she had no restrictions, just to avoid activities that cause pain.  Pt states she does not have a lifting restriction anymore.  He also informed her she could perform aquatic classes.  Pt states she was sore the day after prior Rx and is still sore today.  Pt reports minimally improved confidence with gait.  Pt reports improved performance of sit to stand  transfers and standing tolerance.    PERTINENT HISTORY:  -PLIF L4-S1 on 08/27/2022 -L4-5 decompression, facectomy, and foraminotomy 2019 -Arthritis, anxiety, and Cervical fusion (anterior) 2020  PAIN:  Are you having pain? No   PRECAUTIONS: Back- 12/19- per surgeon no restrictions/no lifting restrictions, activity as tolerated and pain free   WEIGHT BEARING RESTRICTIONS: No restrictions 12/19  FALLS:  Has patient fallen in last 6 months? Yes. Number of falls 2 due to tripping over furniture  and depth perception  LIVING ENVIRONMENT: Lives with: lives with their spouse Lives in: 2 story home, but doesn't use the 2nd floor Stairs: yes Has following equipment at home: None  OCCUPATION: Pt is retired.  She works part time as a Theme park manager.  PLOF: Independent.  Pt has been limited with activity due to the pain over the past 2 years.   PATIENT GOALS: improve balance, walk 3-4 miles, lift things around home, increase standing duration   OBJECTIVE:   DIAGNOSTIC FINDINGS:  Pt is post op.  Pt had x rays on 08/28/2022: 1. Surgical changes of L4-S1 posterior spinal fusion and placement of interbody spacers. No evidence for hardware complication. Restoration of disc space height at L4-L5 and L5-S1. Marked improvement in lower lumbar levocurvature and left lateral translation of L4. 2. No evidence for acute fracture.        TODAY'S TREATMENT:  Therapeutic Exercise: Nustep L5 x5 minutes BLEs only Supine bridge with TrA 2x10 Supine SLR with TrA 2x10 S/L clams with TrA with RTB 2x10 bilat Marching on airex x15 reps with TrA Standing rows with GTB with TrA 2x10   Therapeutic Activities: (to improve functional mobility and performance of stairs) Mini squats with TrA 2x10 Hip hike x10 B U UE support and with hip circles Step ups on 6 inch step with TrA with UE  support 2x10    PATIENT EDUCATION:  Education details:  POC, exercise form, and exercise rationale Person educated: Patient Education method:   Explanation, Demonstration, Tactile cues, Verbal cues Education comprehension: verbalized understanding, returned demonstration, verbal cues required, tactile cues required, and needs further education  HOME EXERCISE PROGRAM: Access Code: [email or web address] URL: [email or web address]  ASSESSMENT:  CLINICAL IMPRESSION:  Pt is improving with function as evidenced by subjective reports including improved performance of sit to stand transfers and standing tolerance.  Pt is progressing with core and LE strength and performed exercises well with cuing for correct form.  Pt has decreased step length bilat with gait.  She responded well to Rx reporting no pain after Rx.  Pt should benefit from cont skilled PT services per protocol to address ongoing goals and to assist in restoring desired level of function.    OBJECTIVE IMPAIRMENTS: decreased activity tolerance, decreased endurance, decreased mobility, difficulty walking, decreased strength, impaired flexibility, and pain.   ACTIVITY LIMITATIONS: carrying, lifting, standing, squatting, stairs, transfers, and locomotion level  PARTICIPATION LIMITATIONS: meal prep, cleaning, laundry, shopping, community activity, and occupation  PERSONAL FACTORS: 1-2 comorbidities: arthritis, cervical fusion  are also affecting patient's functional outcome.     GOALS:   SHORT TERM GOALS: Target date: 11/30/2022   Pt will be independent and compliant with HEP for improved strength, function, and pain.  Baseline: Goal status: INITIAL  2.  Pt will report at least a 25% improvement in standing tolerance.  Baseline:  Goal status: INITIAL  3.  Pt will report she is able to progressively increase ambulation distance without adverse effects.  Baseline:  Goal status: INITIAL Target date: 12/14/2022    LONG  TERM GOALS: Target date: 01/11/2023  Pt will demonstrate improved core strength by progressing with core exercises without adverse effects and improved LE strength to 5/5 in hip flexion and L knee ext for improved tolerance and performance of daily and work activities and functional mobility.  Baseline:  Goal status: INITIAL  2.  Pt will be able to perform her normal work activities without significant pain and difficulty.  Baseline:  Goal status: INITIAL  3.  Pt will be able to perform household chores with expected limitations without significant pain.  Baseline:  Goal status: INITIAL    PLAN:  PT FREQUENCY: 2x/week  PT DURATION: 8-10 weeks  PLANNED INTERVENTIONS: Therapeutic exercises, Therapeutic activity, Neuromuscular re-education, Balance training, Gait training, Patient/Family education, Self Care, Joint mobilization, Stair training, Aquatic Therapy, Dry Needling, Electrical stimulation, Cryotherapy, Moist heat, Taping, Manual therapy, and Re-evaluation.  PLAN FOR NEXT SESSION: Avoid repetitive bending, lifting, and twisting.  Cont with core and LE strengthening per protocol and functional mobility.   Selinda Michaels III PT, DPT 11/26/22 10:37 AM

## 2022-11-30 ENCOUNTER — Ambulatory Visit (HOSPITAL_BASED_OUTPATIENT_CLINIC_OR_DEPARTMENT_OTHER): Payer: Medicare PPO | Admitting: Physical Therapy

## 2022-11-30 DIAGNOSIS — M79642 Pain in left hand: Secondary | ICD-10-CM | POA: Diagnosis not present

## 2022-12-02 ENCOUNTER — Encounter (HOSPITAL_BASED_OUTPATIENT_CLINIC_OR_DEPARTMENT_OTHER): Payer: Self-pay | Admitting: Physical Therapy

## 2022-12-02 ENCOUNTER — Ambulatory Visit (HOSPITAL_BASED_OUTPATIENT_CLINIC_OR_DEPARTMENT_OTHER): Payer: Medicare PPO | Admitting: Physical Therapy

## 2022-12-02 DIAGNOSIS — M6281 Muscle weakness (generalized): Secondary | ICD-10-CM | POA: Diagnosis not present

## 2022-12-02 DIAGNOSIS — M5459 Other low back pain: Secondary | ICD-10-CM | POA: Diagnosis not present

## 2022-12-02 NOTE — Therapy (Signed)
OUTPATIENT PHYSICAL THERAPY THORACOLUMBAR TREATMENT   Patient Name: Meredith Cole MRN: [id number] DOB:[birth year], 76 y.o., female Today's Date: 12/02/2022  END OF SESSION:  PT End of Session - 12/02/22 1227     Visit Number 7    Number of Visits 20    Date for PT Re-Evaluation 01/11/23    Authorization Type [employer] MCR    Progress Note Due on Visit 10    PT Start Time 1148    PT Stop Time 1227    PT Time Calculation (min) 39 min    Activity Tolerance Patient tolerated treatment well    Behavior During Therapy [facility] for tasks assessed/performed                   Past Medical History:  Diagnosis Date   Allergy    Anxiety    hx of   Arthritis    back   Glaucoma    uses drops   Hyperlipidemia    on meds   Hypertension    on meds   Melanoma ([place]) 2016   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   Trichuriasis - whipworm 2009   Incidental at colonoscopy - treated with mebendazole   Vertigo    Past Surgical History:  Procedure Laterality Date   ANTERIOR CERVICAL DECOMP/DISCECTOMY FUSION N/A 06/13/2019   Procedure: ANTERIOR CERVICAL DECOMPRESSION/DISCECTOMY FUSION C6-7;  Surgeon: Melina Schools, MD;  Location: [place];  Service: Orthopedics;  Laterality: N/A;  3 hr   ARM WOUND REPAIR / CLOSURE     WRIST  BROKE WAS REPAIRED   bone plate and graft left upper arm     BREAST LUMPECTOMY WITH RADIOACTIVE SEED LOCALIZATION Left 06/28/2022   Procedure: LEFT BREAST LUMPECTOMY WITH RADIOACTIVE SEED LOCALIZATION;  Surgeon: Jovita Kussmaul, MD;  Location: [place];  Service: General;  Laterality: Left;   CATARACT EXTRACTION W/ INTRAOCULAR LENS  IMPLANT, BILATERAL     GLAUCOMA  PROC   CESAREAN SECTION     CHOLECYSTECTOMY     COLON SURGERY  01/2008   Dr. Zettie Pho; right hemi-colectomy for tubulovillous adenoma   COLONOSCOPY  2002, 2008, 2009 and 06/28/2011   2002: 44m polyp, 2008-2009: large ascending tubulovillous adenoma. 2012:  diverticulosis, external hemorrhoids   COLONOSCOPY  2017   CG-prep exc-TA   LUMBAR LAMINECTOMY/DECOMPRESSION MICRODISCECTOMY N/A 01/05/2018   Procedure: Decompression Lumbar 4-Lumbar 5 ;  Surgeon: BMelina Schools MD;  Location: [place]  Service: Orthopedics;  Laterality: N/A;  120 mins   MELANOMA EXCISION     face   NM RENAL LASIX ([facility])     POLYPECTOMY  2017   TA   SPINAL FUSION  08/27/2022   TONSILLECTOMY     WISDOM TOOTH EXTRACTION     Patient Active Problem List   Diagnosis Date Noted   Chest pain, rule out acute myocardial infarction 09/05/2022   Adnexal mass 09/05/2022   Hypokalemia 09/05/2022   HTN (hypertension) 09/05/2022   Cervical disc herniation 06/13/2019   S/P lumbar spine operation 01/05/2018   Abdominal pain, epigastric 11/08/2016   Vomiting 11/08/2016   Black stool 11/08/2016   Palpitations 01/14/2016   HLD (hyperlipidemia) 03/01/2008   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 Covid test was (-). Still would like to work on overall balance and strength, whatever you think I need to work on.    PERTINENT HISTORY:  -PLIF L4-S1 on 08/27/2022 -L4-5 decompression, facectomy, and foraminotomy 2019 -Arthritis, anxiety, and Cervical fusion (anterior) 2020  PAIN:  Are you having pain? No   PRECAUTIONS: Back- 12/19- per surgeon no restrictions/no lifting restrictions, activity as tolerated and  pain free   WEIGHT BEARING RESTRICTIONS: No restrictions 12/19  FALLS:  Has patient fallen in last 6 months? Yes. Number of falls 2 due to tripping over furniture  and depth perception  LIVING ENVIRONMENT: Lives with: lives with their spouse Lives in: 2 story home, but doesn't use the 2nd floor Stairs: yes Has following equipment at home: None  OCCUPATION: Pt is retired.  She works part time as a Theme park manager.  PLOF: Independent.  Pt has been limited with activity due to the pain over the past 2 years.   PATIENT GOALS: improve balance, walk 3-4 miles, lift things around home, increase standing duration   OBJECTIVE:   DIAGNOSTIC FINDINGS:  Pt is post op.  Pt had x rays on 08/28/2022: 1. Surgical changes of L4-S1 posterior spinal fusion and placement of interbody spacers. No evidence for hardware complication. Restoration of disc space height at L4-L5 and L5-S1. Marked improvement in lower lumbar levocurvature and left lateral translation of L4. 2. No evidence for acute fracture.        TODAY'S TREATMENT:                                                                                                                               TherEx  Nustep L5 x6 minutes BLEs only  Shuttle single leg press 50# 2x10 B Shuttle LE press 75# BLEs x10  Forward and lateral step ups with 10# KB x10 B Forward step downs 4 inch box x12 B Shuttle run: STS with 15# KB + hip hike ABD walks + step ups with 10# KB + wall mini squats     PATIENT EDUCATION:  Education details:  POC, exercise form, and exercise rationale Person educated: Patient Education method:   Explanation, Demonstration, Tactile cues, Verbal cues Education comprehension: verbalized understanding, returned demonstration, verbal cues required, tactile cues required, and needs further education  HOME EXERCISE PROGRAM: Access Code: [email or web address] URL: [email or web address]  ASSESSMENT:  CLINICAL IMPRESSION:  Continued to  progress functional strength and activity tolerance as indicated, pt did very well but was visibly fatigued by interventions today. I do think she is making good progress. Education provided on possible DOMS and management after today's session.   OBJECTIVE IMPAIRMENTS: decreased activity tolerance, decreased endurance, decreased mobility, difficulty walking, decreased strength, impaired flexibility, and pain.   ACTIVITY LIMITATIONS: carrying, lifting, standing, squatting, stairs, transfers, and locomotion level  PARTICIPATION LIMITATIONS: meal prep, cleaning, laundry, shopping, community activity, and occupation  PERSONAL FACTORS: 1-2 comorbidities: arthritis, cervical fusion  are also affecting patient's functional outcome.     GOALS:   SHORT TERM GOALS: Target date: 11/30/2022   Pt will be independent and compliant with HEP for improved strength, function, and pain.  Baseline: Goal status: INITIAL  2.  Pt will report at least a 25% improvement in standing tolerance.  Baseline:  Goal status: INITIAL  3.  Pt will report she is able to progressively increase ambulation distance without adverse effects.  Baseline:  Goal status: INITIAL Target date: 12/14/2022    LONG TERM GOALS: Target date: 01/11/2023  Pt will demonstrate improved core strength by progressing with core exercises without adverse effects and improved LE strength to 5/5 in hip flexion and L knee ext for improved tolerance and performance of daily and work activities and functional mobility.  Baseline:  Goal status: INITIAL  2.  Pt will be able to perform her normal work activities without significant pain and difficulty.  Baseline:  Goal status: INITIAL  3.  Pt will be able to perform household chores with expected limitations without significant pain.  Baseline:  Goal status: INITIAL    PLAN:  PT FREQUENCY: 2x/week  PT DURATION: 8-10 weeks  PLANNED INTERVENTIONS: Therapeutic exercises, Therapeutic  activity, Neuromuscular re-education, Balance training, Gait training, Patient/Family education, Self Care, Joint mobilization, Stair training, Aquatic Therapy, Dry Needling, Electrical stimulation, Cryotherapy, Moist heat, Taping, Manual therapy, and Re-evaluation.  PLAN FOR NEXT SESSION: Avoid repetitive bending, lifting, and twisting.  Cont with core and LE strengthening per protocol and functional mobility.   Ann Lions PT DPT PN2

## 2022-12-03 NOTE — Therapy (Incomplete)
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   Trichuriasis - whipworm 2009   Incidental at colonoscopy - treated with mebendazole   Vertigo    Past Surgical History:  Procedure Laterality Date   ANTERIOR CERVICAL DECOMP/DISCECTOMY FUSION N/A 06/13/2019   Procedure: ANTERIOR CERVICAL DECOMPRESSION/DISCECTOMY FUSION C6-7;  Surgeon: Melina Schools, MD;  Location: [place];  Service: Orthopedics;  Laterality: N/A;  3 hr   ARM WOUND REPAIR / CLOSURE     WRIST  BROKE WAS REPAIRED   bone plate and graft left upper arm     BREAST LUMPECTOMY WITH RADIOACTIVE SEED LOCALIZATION Left 06/28/2022   Procedure: LEFT BREAST LUMPECTOMY WITH RADIOACTIVE SEED LOCALIZATION;  Surgeon: Jovita Kussmaul, MD;  Location: [place];  Service: General;  Laterality: Left;   CATARACT EXTRACTION W/ INTRAOCULAR LENS  IMPLANT, BILATERAL     GLAUCOMA  PROC   CESAREAN SECTION     CHOLECYSTECTOMY     COLON SURGERY  01/2008   Dr. Zettie Pho; right hemi-colectomy for tubulovillous adenoma   COLONOSCOPY  2002, 2008, 2009 and 06/28/2011   2002: 79m polyp, 2008-2009: large ascending tubulovillous adenoma. 2012: diverticulosis, external hemorrhoids   COLONOSCOPY  2017   CG-prep exc-TA   LUMBAR LAMINECTOMY/DECOMPRESSION MICRODISCECTOMY N/A 01/05/2018   Procedure: Decompression Lumbar 4-Lumbar 5 ;  Surgeon: BMelina Schools MD;  Location: [place]  Service: Orthopedics;  Laterality: N/A;  120 mins   MELANOMA EXCISION     face   NM RENAL LASIX (AMasonHX)     POLYPECTOMY  2017   TA   SPINAL FUSION  08/27/2022    TONSILLECTOMY     WISDOM TOOTH EXTRACTION     Patient Active Problem List   Diagnosis Date Noted   Chest pain, rule out acute myocardial infarction 09/05/2022   Adnexal mass 09/05/2022   Hypokalemia 09/05/2022   HTN (hypertension) 09/05/2022   Cervical disc herniation 06/13/2019   S/P lumbar spine operation 01/05/2018   Abdominal pain, epigastric 11/08/2016   Vomiting 11/08/2016   Black stool 11/08/2016   Palpitations 01/14/2016   HLD (hyperlipidemia) 03/01/2008   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  ONSET DATE: DOS 08/27/2022  SUBJECTIVE:  SUBJECTIVE STATEMENT:  Pt is 14 weeks and 4 days s/p PLIF.  I was a little sick Tuesday but I'm better now. Covid test was (-). Still would like to work on overall balance and strength, whatever you think I need to work on.    PERTINENT HISTORY:  -PLIF L4-S1 on 08/27/2022 -L4-5 decompression, facectomy, and foraminotomy 2019 -Arthritis, anxiety, and Cervical fusion (anterior) 2020  PAIN:  Are you having pain? No   PRECAUTIONS: Back- 12/19- per surgeon no restrictions/no lifting restrictions, activity as tolerated and pain free   WEIGHT BEARING RESTRICTIONS: No restrictions 12/19  FALLS:  Has patient fallen in last 6 months? Yes. Number of falls 2 due to tripping over furniture  and depth perception  LIVING ENVIRONMENT: Lives with: lives with their spouse Lives in: 2 story home, but doesn't use the 2nd floor Stairs: yes Has following equipment at home: None  OCCUPATION: Pt is retired.  She works part  time as a Theme park manager.  PLOF: Independent.  Pt has been limited with activity due to the pain over the past 2 years.   PATIENT GOALS: improve balance, walk 3-4 miles, lift things around home, increase standing duration   OBJECTIVE:   DIAGNOSTIC FINDINGS:  Pt is post op.  Pt had x rays on 08/28/2022: 1. Surgical changes of L4-S1 posterior spinal fusion and placement of interbody spacers. No evidence for hardware complication. Restoration of disc space height at L4-L5 and L5-S1. Marked improvement in lower lumbar levocurvature and left lateral translation of L4. 2. No evidence for acute fracture.        TODAY'S TREATMENT:                                                                                                                               TherEx  Nustep L5 x6 minutes BLEs only  Shuttle single leg press 50# 2x10 B Shuttle LE press 75# BLEs x10  Forward and lateral step ups with 10# KB x10 B Forward step downs 4 inch box x12 B Shuttle run: STS with 15# KB + hip hike ABD walks + step ups with 10# KB + wall mini squats     PATIENT EDUCATION:  Education details:  POC, exercise form, and exercise rationale Person educated: Patient Education method:   Explanation, Demonstration, Tactile cues, Verbal cues Education comprehension: verbalized understanding, returned demonstration, verbal cues required, tactile cues required, and needs further education  HOME EXERCISE PROGRAM: Access Code: [email or web address] URL: [email or web address]  ASSESSMENT:  CLINICAL IMPRESSION:  Continued to progress functional strength and activity tolerance as indicated, pt did very well but was visibly fatigued by interventions today. I do think she is making good progress. Education provided on possible DOMS and management after today's session.   OBJECTIVE IMPAIRMENTS: decreased activity tolerance, decreased endurance, decreased mobility, difficulty walking, decreased strength, impaired flexibility,  and pain.   ACTIVITY LIMITATIONS: carrying, lifting, standing, squatting, stairs, transfers, and locomotion level  PARTICIPATION LIMITATIONS: meal prep,  cleaning, laundry, shopping, community activity, and occupation  PERSONAL FACTORS: 1-2 comorbidities: arthritis, cervical fusion  are also affecting patient's functional outcome.     GOALS:   SHORT TERM GOALS: Target date: 11/30/2022   Pt will be independent and compliant with HEP for improved strength, function, and pain.  Baseline: Goal status: INITIAL  2.  Pt will report at least a 25% improvement in standing tolerance.  Baseline:  Goal status: INITIAL  3.  Pt will report she is able to progressively increase ambulation distance without adverse effects.  Baseline:  Goal status: INITIAL Target date: 12/14/2022    LONG TERM GOALS: Target date: 01/11/2023  Pt will demonstrate improved core strength by progressing with core exercises without adverse effects and improved LE strength to 5/5 in hip flexion and L knee ext for improved tolerance and performance of daily and work activities and functional mobility.  Baseline:  Goal status: INITIAL  2.  Pt will be able to perform her normal work activities without significant pain and difficulty.  Baseline:  Goal status: INITIAL  3.  Pt will be able to perform household chores with expected limitations without significant pain.  Baseline:  Goal status: INITIAL    PLAN:  PT FREQUENCY: 2x/week  PT DURATION: 8-10 weeks  PLANNED INTERVENTIONS: Therapeutic exercises, Therapeutic activity, Neuromuscular re-education, Balance training, Gait training, Patient/Family education, Self Care, Joint mobilization, Stair training, Aquatic Therapy, Dry Needling, Electrical stimulation, Cryotherapy, Moist heat, Taping, Manual therapy, and Re-evaluation.  PLAN FOR NEXT SESSION: Avoid repetitive bending, lifting, and twisting.  Cont with core and LE strengthening per protocol and functional  mobility.   Ann Lions PT DPT PN2

## 2022-12-07 ENCOUNTER — Emergency Department (HOSPITAL_BASED_OUTPATIENT_CLINIC_OR_DEPARTMENT_OTHER)
Admission: EM | Admit: 2022-12-07 | Discharge: 2022-12-07 | Disposition: A | Discharge status: Home / Self Care | Payer: Medicare PPO | Attending: Emergency Medicine | Admitting: Emergency Medicine

## 2022-12-07 ENCOUNTER — Ambulatory Visit (HOSPITAL_BASED_OUTPATIENT_CLINIC_OR_DEPARTMENT_OTHER): Payer: Medicare PPO | Attending: *Deleted | Admitting: Physical Therapy

## 2022-12-07 ENCOUNTER — Other Ambulatory Visit: Payer: Self-pay

## 2022-12-07 ENCOUNTER — Encounter (HOSPITAL_BASED_OUTPATIENT_CLINIC_OR_DEPARTMENT_OTHER): Payer: Self-pay | Admitting: Emergency Medicine

## 2022-12-07 DIAGNOSIS — M5459 Other low back pain: Secondary | ICD-10-CM

## 2022-12-07 DIAGNOSIS — I10 Essential (primary) hypertension: Secondary | ICD-10-CM | POA: Insufficient documentation

## 2022-12-07 DIAGNOSIS — R2681 Unsteadiness on feet: Secondary | ICD-10-CM | POA: Diagnosis not present

## 2022-12-07 DIAGNOSIS — Z79899 Other long term (current) drug therapy: Secondary | ICD-10-CM | POA: Insufficient documentation

## 2022-12-07 DIAGNOSIS — M6281 Muscle weakness (generalized): Secondary | ICD-10-CM

## 2022-12-07 LAB — BASIC METABOLIC PANEL
Anion gap: 11 (ref 5–15)
BUN: 18 mg/dL (ref 8–23)
CO2: 26 mmol/L (ref 22–32)
Calcium: 9.5 mg/dL (ref 8.9–10.3)
Chloride: 104 mmol/L (ref 98–111)
Creatinine, Ser: 1.32 mg/dL — ABNORMAL HIGH (ref 0.44–1.00)
GFR, Estimated: 42 mL/min — ABNORMAL LOW (ref >60)
Glucose, Bld: 92 mg/dL (ref 70–99)
Potassium: 4 mmol/L (ref 3.5–5.1)
Sodium: 141 mmol/L (ref 135–145)

## 2022-12-07 LAB — CBC
HCT: 42.9 % (ref 36.0–46.0)
Hemoglobin: 14 g/dL (ref 12.0–15.0)
MCH: 31.5 pg (ref 26.0–34.0)
MCHC: 32.6 g/dL (ref 30.0–36.0)
MCV: 96.4 fL (ref 80.0–100.0)
Platelets: 277 10*3/uL (ref 150–400)
RBC: 4.45 MIL/uL (ref 3.87–5.11)
RDW: 12.8 % (ref 11.5–15.5)
WBC: 7.7 10*3/uL (ref 4.0–10.5)
nRBC: 0 % (ref 0.0–0.2)

## 2022-12-07 NOTE — Discharge Instructions (Signed)
Please keep a blood pressure log like we discussed.  If your blood pressure continues to be elevated it might be worth talking to your doctor about the medication adjustments.  As of right now I am not really that concerned.  Please return to the emergency ferment if you are experiencing any chest pain, weakness to your arms or legs, severe headache, or any other concerns you might have.

## 2022-12-07 NOTE — ED Notes (Signed)
Pt discharged to home. Discharge instructions have been discussed with patient and/or family members. Pt verbally acknowledges understanding d/c instructions, and endorses comprehension to checkout at registration before leaving.  °

## 2022-12-07 NOTE — ED Triage Notes (Signed)
Pt arrives to ED with c/o hypertension. She notes she was at her PT appointment today and became lightheaded causing her to take her BP.

## 2022-12-07 NOTE — ED Provider Notes (Signed)
[facility] EMERGENCY DEPT Provider Note   CSN: [id number] Arrival date & time: 12/07/22  1213     History Chief Complaint  Patient presents with   Hypertension    Meredith Cole is a 77 y.o. female with history of hypertension who presents to the emergency department today for further evaluation of elevated blood pressures during physical therapy earlier this morning.  Patient states that she was doing some physical therapy when she felt a little uneasy on her feet briefly.  They took her blood pressure and it was 989 systolic over 211 diastolic.  This is abnormal for the patient.  She was advised by her physical therapist to get evaluated.  Her PCP was closed today and she came to the emergency department for further evaluation.  She denies any symptoms including chest pain, shortness of breath, headache, focal weakness or numbness, syncope, dizziness, or lightheadedness currently.   Hypertension       Home Medications Prior to Admission medications   Medication Sig Start Date End Date Taking? Authorizing Provider  atorvastatin (LIPITOR) 20 MG tablet Take 20 mg by mouth at bedtime.     [provider]  buPROPion (WELLBUTRIN XL) 150 MG 24 hr tablet Take 150 mg by mouth daily.    [provider]  dorzolamidel-timolol (COSOPT) 22.3-6.8 MG/ML SOLN ophthalmic solution Place 1 drop into both eyes 2 (two) times daily. 08/24/22   [provider]  DULoxetine (CYMBALTA) 60 MG capsule Take 60 mg by mouth daily.    [provider]  estradiol (ESTRACE) 0.1 MG/GM vaginal cream Place 1 Applicatorful vaginally once a week. 01/24/21   [provider]  fexofenadine (ALLEGRA) 180 MG tablet Take 180 mg by mouth daily.      [provider]  hydrochlorothiazide (MICROZIDE) 12.5 MG capsule Take 12.5 mg by mouth daily.    [provider]  HYDROcodone-acetaminophen (NORCO/VICODIN) 5-325 MG tablet Take 1-2 tablets by mouth every 4  (four) hours as needed for pain. Patient not taking: Reported on 11/02/2022 08/28/22   [provider]  Melatonin 10 MG TABS Take 1 tablet by mouth at bedtime as needed (sleep).    [provider]  methocarbamol (ROBAXIN) 500 MG tablet Take 500 mg by mouth 3 (three) times daily. Patient not taking: Reported on 11/02/2022 08/28/22   [provider]  Multiple Vitamin (MULTIVITAMINS PO) Take 1 tablet by mouth daily.    [provider]  OZEMPIC, 0.25 OR 0.5 MG/DOSE, 2 MG/3ML SOPN Inject 0.5 mg into the skin once a week. Monday 08/03/22   [provider]  Propylene Glycol (SYSTANE BALANCE) 0.6 % SOLN Place 1 drop into both eyes daily as needed (dry eyes).    [provider]  tretinoin (RETIN-A) 0.025 % cream Apply 1 application  topically 3 (three) times a week.    [provider]      Allergies    Lisinopril    Review of Systems   Review of Systems  All other systems reviewed and are negative.   Physical Exam Updated Vital Signs BP (!) 154/93   Pulse 80   Temp (!) 97.5 F (36.4 C)   Resp 18   Ht '5\' 7"'$  (1.702 m)   Wt 78.5 kg   SpO2 100%   BMI 27.10 kg/m  Physical Exam Vitals and nursing note reviewed.  Constitutional:      General: She is not in acute distress.    Appearance: Normal appearance.  HENT:  Head: Normocephalic and atraumatic.  Eyes:     General:        Right eye: No discharge.        Left eye: No discharge.  Cardiovascular:     Comments: Regular rate and rhythm.  S1/S2 are distinct without any evidence of murmur, rubs, or gallops.  Radial pulses are 2+ bilaterally.  Dorsalis pedis pulses are 2+ bilaterally.  No evidence of pedal edema. Pulmonary:     Comments: Clear to auscultation bilaterally.  Normal effort.  No respiratory distress.  No evidence of wheezes, rales, or rhonchi heard throughout. Abdominal:     General: Abdomen is flat. Bowel sounds are normal. There is no distension.     Tenderness:  There is no abdominal tenderness. There is no guarding or rebound.  Musculoskeletal:        General: Normal range of motion.     Cervical back: Neck supple.  Skin:    General: Skin is warm and dry.     Findings: No rash.  Neurological:     General: No focal deficit present.     Mental Status: She is alert.     Comments: Cranial nerves II to XII are intact.  5/5 strength to the upper and lower extremities.  Normal sensation to the upper and lower extremities.  No dysmetria with finger-to-nose.  Speech is normal.  No facial droop.  Answers all my questions appropriately.  Psychiatric:        Mood and Affect: Mood normal.        Behavior: Behavior normal.     ED Results / Procedures / Treatments   Labs (all labs ordered are listed, but only abnormal results are displayed) Labs Reviewed  BASIC METABOLIC PANEL - Abnormal; Notable for the following components:      Result Value   Creatinine, Ser 1.32 (*)    GFR, Estimated 42 (*)    All other components within normal limits  CBC    EKG None  Radiology No results found.  Procedures Procedures    Medications Ordered in ED Medications - No data to display  ED Course/ Medical Decision Making/ A&P                           Medical Decision Making Meredith Cole is a 77 y.o. female patient who presents to the emergency department today for further evaluation of hypertension.  Patient's blood pressure is currently 154/93.  She has no symptoms.  She does not meet criteria for malignant hypertension or hypertension emergency.  Patient's blood work is normal.  This was ordered in triage interpreted by myself. Apart from her creatinine which is at the patient's baseline the rest of her labs are normal.  I will have her follow-up with her primary care doctor for further evaluation.  Strict return precautions were discussed.  She safer discharge.  I have a low suspicion for acute coronary syndrome and stroke at this time.  Amount  and/or Complexity of Data Reviewed Labs: ordered.   Final Clinical Impression(s) / ED Diagnoses Final diagnoses:  Asymptomatic hypertension    Rx / DC Orders ED Discharge Orders     None         Cherrie Gauze 12/07/22 1633    Regan Lemming, MD 12/07/22 2100

## 2022-12-08 ENCOUNTER — Encounter (HOSPITAL_BASED_OUTPATIENT_CLINIC_OR_DEPARTMENT_OTHER): Payer: Self-pay | Admitting: Physical Therapy

## 2022-12-09 ENCOUNTER — Ambulatory Visit (HOSPITAL_BASED_OUTPATIENT_CLINIC_OR_DEPARTMENT_OTHER): Payer: Medicare PPO | Admitting: Physical Therapy

## 2022-12-14 ENCOUNTER — Ambulatory Visit (HOSPITAL_BASED_OUTPATIENT_CLINIC_OR_DEPARTMENT_OTHER): Payer: Medicare PPO | Admitting: Physical Therapy

## 2022-12-16 ENCOUNTER — Ambulatory Visit (HOSPITAL_BASED_OUTPATIENT_CLINIC_OR_DEPARTMENT_OTHER): Payer: Medicare PPO | Admitting: Physical Therapy

## 2022-12-28 ENCOUNTER — Ambulatory Visit (HOSPITAL_BASED_OUTPATIENT_CLINIC_OR_DEPARTMENT_OTHER): Payer: Medicare PPO | Admitting: Physical Therapy

## 2022-12-28 DIAGNOSIS — H401113 Primary open-angle glaucoma, right eye, severe stage: Secondary | ICD-10-CM | POA: Diagnosis not present

## 2022-12-28 DIAGNOSIS — H401122 Primary open-angle glaucoma, left eye, moderate stage: Secondary | ICD-10-CM | POA: Diagnosis not present

## 2022-12-28 DIAGNOSIS — H3093 Unspecified chorioretinal inflammation, bilateral: Secondary | ICD-10-CM | POA: Diagnosis not present

## 2022-12-30 ENCOUNTER — Encounter (HOSPITAL_BASED_OUTPATIENT_CLINIC_OR_DEPARTMENT_OTHER): Payer: Self-pay | Admitting: Physical Therapy

## 2022-12-30 ENCOUNTER — Ambulatory Visit (HOSPITAL_BASED_OUTPATIENT_CLINIC_OR_DEPARTMENT_OTHER): Payer: Medicare PPO | Admitting: Physical Therapy

## 2022-12-30 DIAGNOSIS — M6281 Muscle weakness (generalized): Secondary | ICD-10-CM

## 2022-12-30 DIAGNOSIS — M5459 Other low back pain: Secondary | ICD-10-CM | POA: Insufficient documentation

## 2022-12-30 NOTE — Therapy (Signed)
OUTPATIENT PHYSICAL THERAPY THORACOLUMBAR TREATMENT / DISCHARGE SUMMARY   Patient Name: Meredith Cole MRN: [id number] DOB:[birth year], 77 y.o., female Today's Date: 12/30/2022  END OF SESSION:  PT End of Session - 12/30/22 0938     Visit Number 9    Number of Visits 9    Authorization Type [employer] MCR    PT Start Time 6010    PT Stop Time 1012    PT Time Calculation (min) 39 min    Activity Tolerance Patient tolerated treatment well    Behavior During Therapy [facility] for tasks assessed/performed                 Past Medical History:  Diagnosis Date   Allergy    Anxiety    hx of   Arthritis    back   Glaucoma    uses drops   Hyperlipidemia    on meds   Hypertension    on meds   Melanoma ([place]) 2016   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   Trichuriasis - whipworm 2009   Incidental at colonoscopy - treated with mebendazole   Vertigo    Past Surgical History:  Procedure Laterality Date   ANTERIOR CERVICAL DECOMP/DISCECTOMY FUSION N/A 06/13/2019   Procedure: ANTERIOR CERVICAL DECOMPRESSION/DISCECTOMY FUSION C6-7;  Surgeon: Melina Schools, MD;  Location: [place];  Service: Orthopedics;  Laterality: N/A;  3 hr   ARM WOUND REPAIR / CLOSURE     WRIST  BROKE WAS REPAIRED   bone plate and graft left upper arm     BREAST LUMPECTOMY WITH RADIOACTIVE SEED LOCALIZATION Left 06/28/2022   Procedure: LEFT BREAST LUMPECTOMY WITH RADIOACTIVE SEED LOCALIZATION;  Surgeon: Jovita Kussmaul, MD;  Location: [place];  Service: General;  Laterality: Left;   CATARACT EXTRACTION W/ INTRAOCULAR LENS  IMPLANT, BILATERAL     GLAUCOMA  PROC   CESAREAN SECTION     CHOLECYSTECTOMY     COLON SURGERY  01/2008   Dr. Zettie Pho; right hemi-colectomy for tubulovillous adenoma   COLONOSCOPY  2002, 2008, 2009 and 06/28/2011   2002: 22m polyp, 2008-2009: large ascending tubulovillous adenoma. 2012: diverticulosis, external hemorrhoids   COLONOSCOPY  2017   CG-prep  exc-TA   LUMBAR LAMINECTOMY/DECOMPRESSION MICRODISCECTOMY N/A 01/05/2018   Procedure: Decompression Lumbar 4-Lumbar 5 ;  Surgeon: BMelina Schools MD;  Location[place]  Service: Orthopedics;  Laterality: N/A;  120 mins   MELANOMA EXCISION     face   NM RENAL LASIX (AKraemerHX)     POLYPECTOMY  2017   TA   SPINAL FUSION  08/27/2022   TONSILLECTOMY     WISDOM TOOTH EXTRACTION     Patient Active Problem List   Diagnosis Date Noted   Chest pain, rule out acute myocardial infarction 09/05/2022   Adnexal mass 09/05/2022   Hypokalemia 09/05/2022   HTN (hypertension) 09/05/2022   Cervical disc herniation 06/13/2019   S/P lumbar spine operation 01/05/2018   Abdominal pain, epigastric 11/08/2016   Vomiting 11/08/2016   Black stool 11/08/2016   Palpitations 01/14/2016   HLD (hyperlipidemia) 03/01/2008   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    Pt had high BP at prior Rx and PT stopped Rx.  PT instructed pt to see MD to get it checked out.  Pt went to ED and states they were not overly concerned.  Pt had bloodwork.  Her diastolic number had improved at ED.  Pt was instructed to monitor BP at home and to call MD if she still has high BP.  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  Pt took off a week at work and went on a vacation.  Pt did well on vacation.  Pt did a lot of walking and felt fine.   Pt states looking back, she may have had too many changes in a small amount of time.  She started a diet and a new medication Ozempic before having surgery.  Pt is taking dramamine which helps the nauseated feeling.  Pt spoke to her PCP over the phone.  Pt states she is ready to stop PT and states her PCP was ok with her stopping PT but continuing with walking and exercise.  Pt states her surgeon has released her to gym exercises and activities per tolerance.    Pt reports almost 100% improvement with standing tolerance.  Pt reports improved performance of household chores though does continue to have limitations and pain with folding clothes and washing dishes.        PERTINENT HISTORY:  -PLIF L4-S1 on 08/27/2022 -L4-5 decompression, facectomy, and foraminotomy 2019 -Arthritis, anxiety, and Cervical fusion (anterior) 2020  PAIN:  Are you having pain? No Pt denies lumbar pain.  Worst Pain:  0/10   PRECAUTIONS: Back- 12/19- per surgeon no restrictions/no lifting restrictions, activity as tolerated and pain free   WEIGHT BEARING RESTRICTIONS: No restrictions 12/19  FALLS:  Has patient fallen in last 6 months? Yes. Number of falls 2 due to tripping over furniture  and depth perception  LIVING ENVIRONMENT: Lives with: lives with their spouse Lives in: 2 story home, but doesn't use the 2nd floor Stairs: yes Has following equipment at home: None  OCCUPATION: Pt is retired.  She works part time as a Theme park manager.  PLOF: Independent.  Pt has been limited with activity due to the pain over the past 2 years.   PATIENT GOALS: improve balance, walk 3-4 miles, lift things around home, increase standing duration   OBJECTIVE:   DIAGNOSTIC FINDINGS:  Pt is post op.  Pt had x rays on 08/28/2022: 1. Surgical changes of L4-S1 posterior spinal fusion and placement of interbody spacers. No evidence for hardware complication. Restoration of disc space height at L4-L5 and L5-S1. Marked improvement in lower  lumbar levocurvature and left lateral translation of L4. 2. No evidence for acute fracture.        TODAY'S TREATMENT:  Reviewed current function, pt presentation, and pain levels.    LE STRENGTH: Hip flexion:  4+/5 bilat L knee extension:  5/5   Pt performed hip hiking x 10 bilat. PT updated HEP and went thru HEP educating pt in correct exercises and form and appropriate frequency.  Pt received a HEP handout.  PT instructed pt in the importance of continuing walking program.    FOTO:  Initial/Current: 47 / 72.  goal of 58 at visit #14    PATIENT EDUCATION:  Education details:  Discharge planning, goal progress, objective findings, HEP, exercise form, and exercise rationale.   Person educated: Patient  Education method:   Explanation, Demonstration, Tactile cues, Verbal cues, handout Education comprehension: verbalized understanding, returned demonstration, verbal cues required, tactile cues required, and needs further education  HOME EXERCISE PROGRAM: Access Code: [email or web address] URL: [email or web address]  ASSESSMENT:  CLINICAL IMPRESSION:  Pt is doing better now.  She did go to the ER after having high BP at prior PT Rx.  Pt's BP was lower at ER and was released to go home and monitor it.  Pt also spoke with PCP.  Pt states she is ready to be discharged from PT and states her PCP is agreeable with decision.  Pt reports almost 100% improvement with standing tolerance.  Pt reports improved performance of household chores though does continue to have limitations and pain with folding clothes and washing dishes.  PT reviewed exercises with pt and updated HEP.  PT went through exercises with pt to continue at home and instructed pt in correct form and appropriate frequency.  Pt demonstrates good understanding of HEP.  Pt demonstrates clinically  significant improvement in self perceived disability with FOTO score improving from 47 initially to 72 currently.  Pt met FOTO goal.  Pt met STG's #2,3 and partially met other STG and LTG's.  Pt will be discharged due to self discharge and doing well functionally.       OBJECTIVE IMPAIRMENTS: decreased activity tolerance, decreased endurance, decreased mobility, difficulty walking, decreased strength, impaired flexibility, and pain.   ACTIVITY LIMITATIONS: carrying, lifting, standing, squatting, stairs, transfers, and locomotion level  PARTICIPATION LIMITATIONS: meal prep, cleaning, laundry, shopping, community activity, and occupation  PERSONAL FACTORS: 1-2 comorbidities: arthritis, cervical fusion  are also affecting patient's functional outcome.     GOALS:   SHORT TERM GOALS: Target date: 11/30/2022   Pt will be independent and compliant with HEP for improved strength, function, and pain.  Baseline: Goal status:  PARTIALLY MET  2.  Pt will report at least a 25% improvement in standing tolerance.  Baseline:  Goal status:  GOAL MET  3.  Pt will report she is able to progressively increase ambulation distance without adverse effects.  Baseline:  Goal status: GOAL MET Target date: 12/14/2022    LONG TERM GOALS: Target date: 01/11/2023  Pt will demonstrate improved core strength by progressing with core exercises without adverse effects and improved LE strength to 5/5 in hip flexion and L knee ext for improved tolerance and performance of daily and work activities and functional mobility.  Baseline:  Goal status: PARTIALLY MET  2.  Pt will be able to perform her normal work activities without significant pain and difficulty.  Baseline:  Goal status:  PARTIALLY MET  3.  Pt will be able to perform household chores with expected limitations without significant pain.  Baseline:  Goal status: PARTIALLY MET    PLAN:   PLANNED INTERVENTIONS: Therapeutic exercises, Therapeutic  activity, Neuromuscular re-education,  Balance training, Gait training, Patient/Family education, Self Care, Joint mobilization, Stair training, Aquatic Therapy, Dry Needling, Electrical stimulation, Cryotherapy, Moist heat, Taping, Manual therapy, and Re-evaluation.  PLAN FOR NEXT SESSION:  Pt will be discharged from skilled PT services due to self discharge and doing well functionally.  She has a HEP and will cont with HEP.   Selinda Michaels III PT, DPT 12/30/22 10:36 PM

## 2023-01-04 ENCOUNTER — Ambulatory Visit (HOSPITAL_BASED_OUTPATIENT_CLINIC_OR_DEPARTMENT_OTHER): Payer: Medicare PPO | Admitting: Physical Therapy

## 2023-01-06 ENCOUNTER — Ambulatory Visit (HOSPITAL_BASED_OUTPATIENT_CLINIC_OR_DEPARTMENT_OTHER): Payer: Medicare PPO | Admitting: Physical Therapy

## 2023-01-07 DIAGNOSIS — R110 Nausea: Secondary | ICD-10-CM | POA: Diagnosis not present

## 2023-01-07 DIAGNOSIS — U071 COVID-19: Secondary | ICD-10-CM | POA: Diagnosis not present

## 2023-01-11 ENCOUNTER — Encounter (HOSPITAL_BASED_OUTPATIENT_CLINIC_OR_DEPARTMENT_OTHER): Payer: Medicare PPO | Admitting: Physical Therapy

## 2023-01-11 DIAGNOSIS — I700 Atherosclerosis of aorta: Secondary | ICD-10-CM | POA: Diagnosis not present

## 2023-01-11 DIAGNOSIS — Z6829 Body mass index (BMI) 29.0-29.9, adult: Secondary | ICD-10-CM | POA: Diagnosis not present

## 2023-01-11 DIAGNOSIS — I10 Essential (primary) hypertension: Secondary | ICD-10-CM | POA: Diagnosis not present

## 2023-01-11 DIAGNOSIS — N1831 Chronic kidney disease, stage 3a: Secondary | ICD-10-CM | POA: Diagnosis not present

## 2023-01-11 DIAGNOSIS — R7309 Other abnormal glucose: Secondary | ICD-10-CM | POA: Diagnosis not present

## 2023-01-13 ENCOUNTER — Encounter (HOSPITAL_BASED_OUTPATIENT_CLINIC_OR_DEPARTMENT_OTHER): Payer: Medicare PPO | Admitting: Physical Therapy

## 2023-01-23 IMAGING — MR MR [PERSON_NAME]*[PERSON_NAME]* W/O CM
5 series · 40 of 40 positions shown · non-contrast
Comparison: None.

CLINICAL DATA: Left hand pain centered at the second and third MCP
joints since a recent fall.

EXAM:
MRI OF THE LEFT HAND WITHOUT CONTRAST
TECHNIQUE: Multiplanar, multisequence MR imaging of the left hand was
performed. No intravenous contrast was administered.

[Series 6: T1 · axial · left · 4.0mm · 0.38mm/px · z∈[-42,+126]mm · 12 of 40 slices shown (1 of 2)]
[im 1/40]
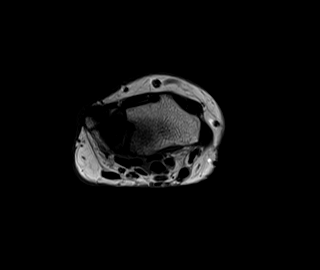
[im 4/40]
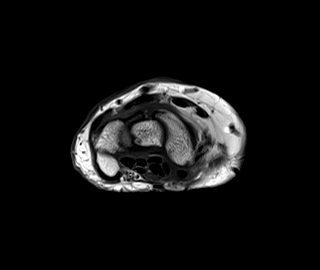
[im 8/40]
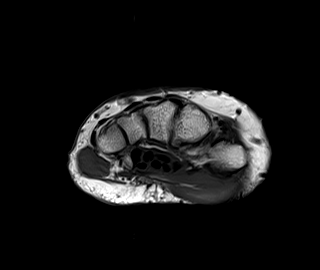
[im 11/40]
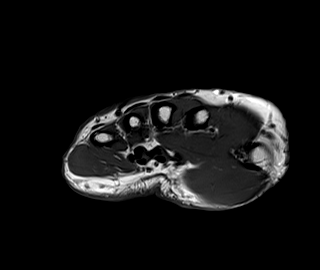
[im 15/40]
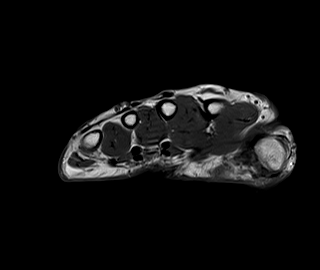
[im 18/40]
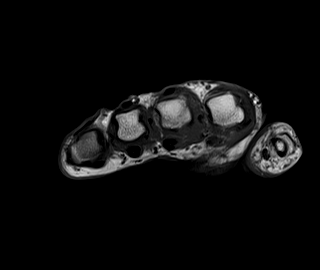
[im 22/40]
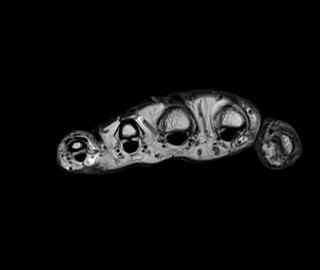
[im 25/40]
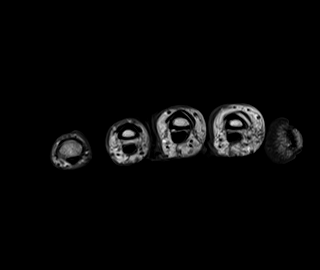
[im 29/40]
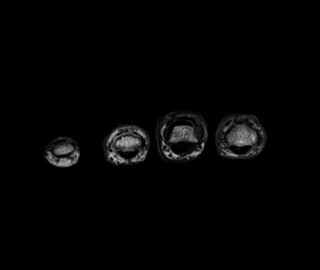
[im 32/40]
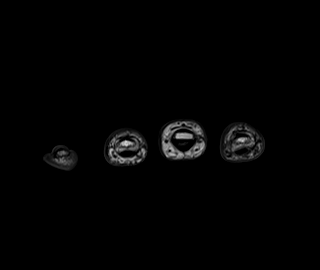
[im 36/40]
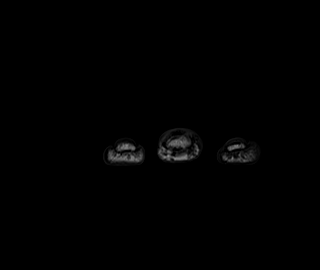
[im 40/40]
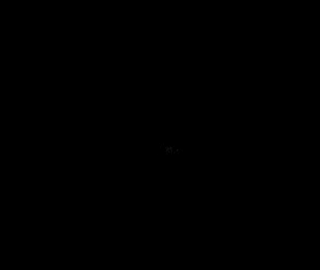

[Series 7: T2 fat-sat · axial · left · 4.0mm · 0.38mm/px · z∈[-42,+126]mm · 11 of 40 slices shown]
[im 1/40]
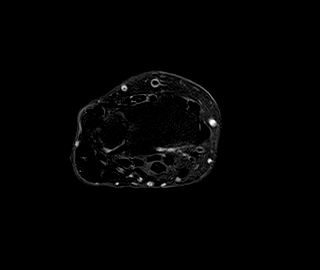
[im 4/40]
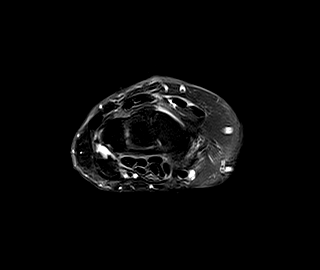
[im 8/40]
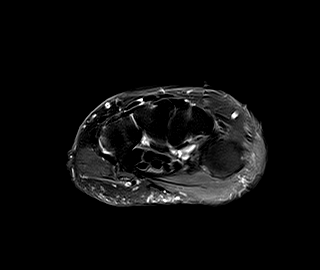
[im 12/40]
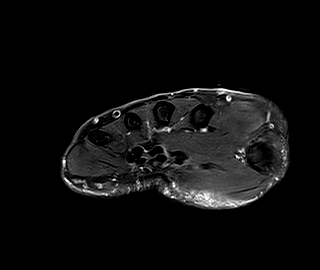
[im 16/40]
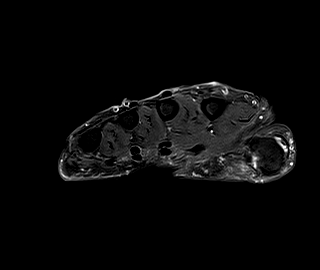
[im 20/40]
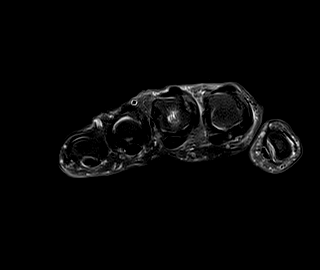
[im 24/40]
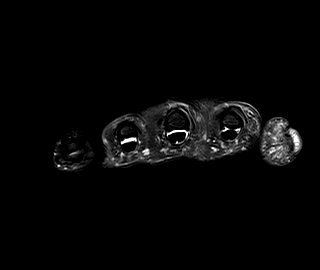
[im 28/40]
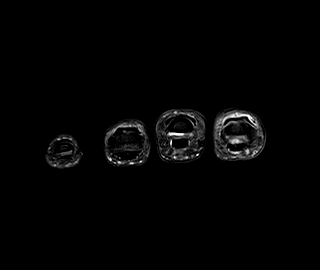
[im 32/40]
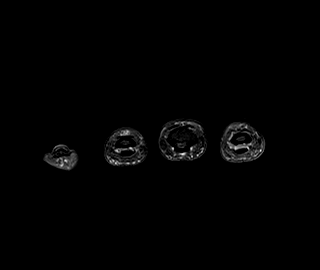
[im 36/40]
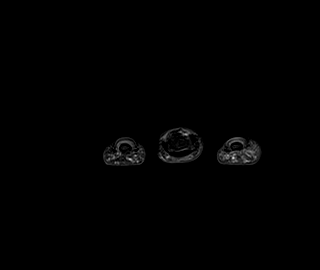
[im 40/40]
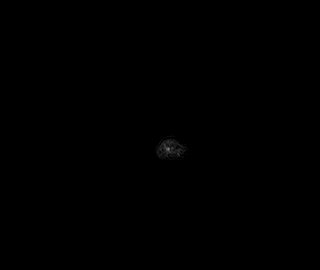

[Series 8: T1 · coronal · left · 3.0mm · 0.44mm/px · 4 of 15 slices shown (2 of 2)]
[im 1/15]
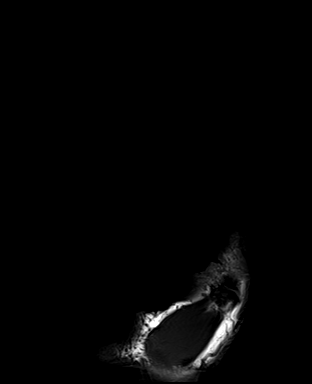
[im 5/15]
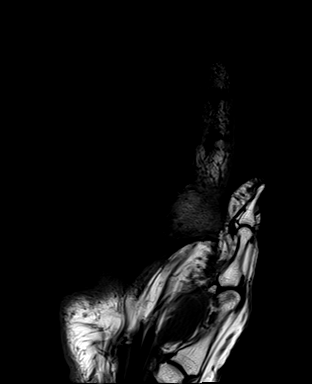
[im 10/15]
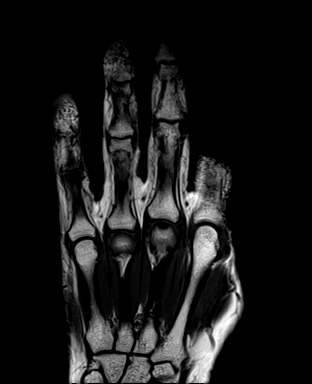
[im 15/15]
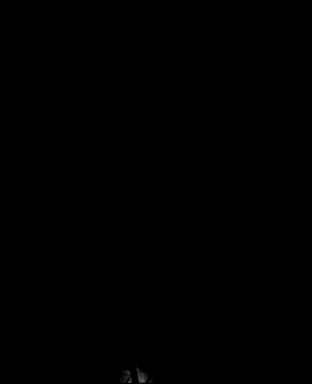

[Series 9: STIR · coronal · left · 3.0mm · 0.44mm/px · 4 of 15 slices shown]
[im 1/15]
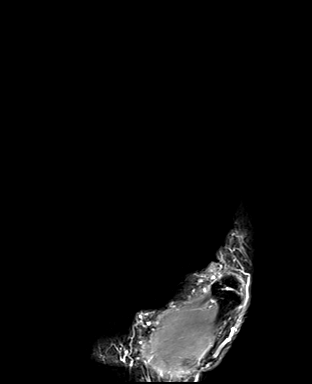
[im 5/15]
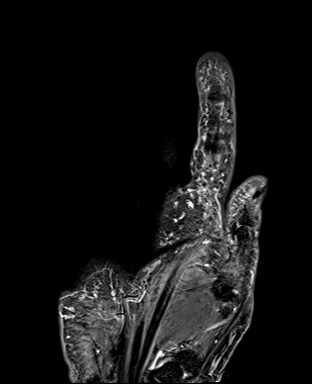
[im 10/15]
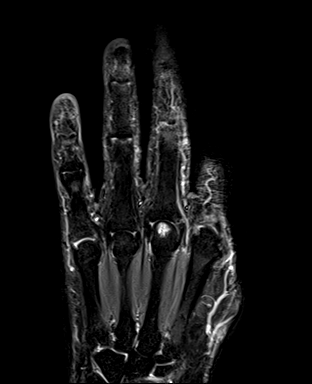
[im 15/15]
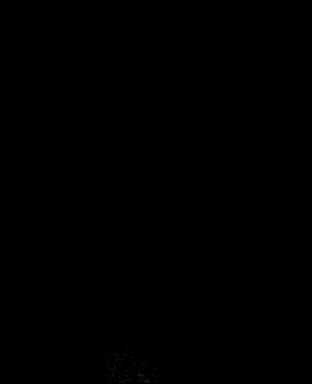

[Series 10: PD fat-sat · sagittal · left · 3.0mm · 0.53mm/px · 9 of 30 slices shown]
[im 1/30]
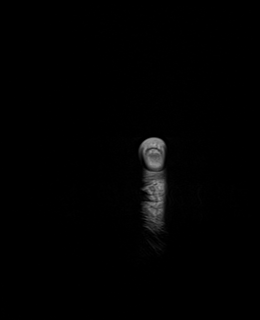
[im 4/30]
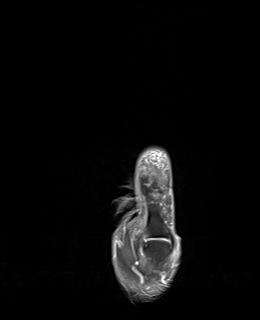
[im 8/30]
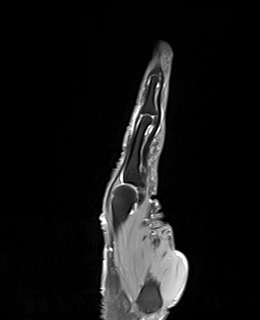
[im 11/30]
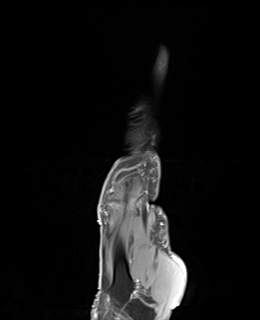
[im 15/30]
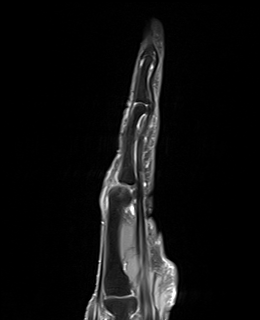
[im 19/30]
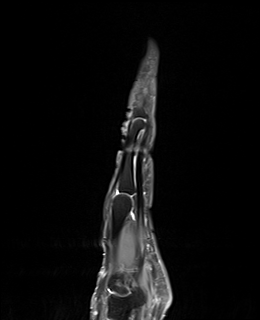
[im 22/30]
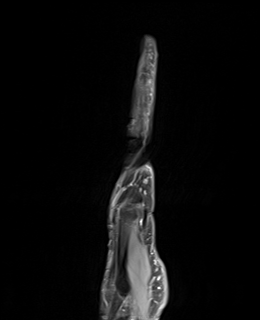
[im 26/30]
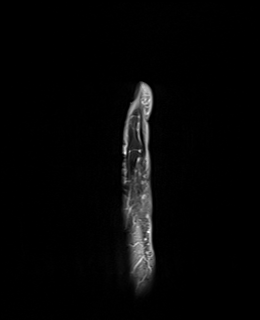
[im 30/30]
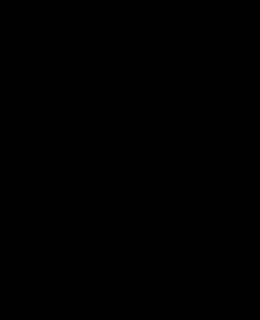

[40 of 40 positions shown; findings below may reference images not displayed]

FINDINGS: Bones/Joint/Cartilage

There is focal marrow edema in the head of the second metacarpal
which is likely due to a bone contusion given the patient's history
of a fall. Bone marrow signal is otherwise normal without fracture
or worrisome lesion. No joint effusion is identified. There are
small second, third and fourth MCP joint effusions, largest at the
second and third MCP joints. No evidence of arthropathy.

Ligaments

Intact.

Muscles and Tendons

Intact.  No pulley injury is identified.

Soft tissues

No fluid collection or mass.
IMPRESSION: Focal edema in the head of the second metacarpal is likely due to a
bone contusion given the patient's history of trauma. Small
enchondroma is also within the differential. No fracture is
identified.

Small second, third and fourth MCP joint effusions may be
posttraumatic.

Negative for tendon or ligament abnormality.

## 2023-02-02 DIAGNOSIS — H35353 Cystoid macular degeneration, bilateral: Secondary | ICD-10-CM | POA: Diagnosis not present

## 2023-02-02 DIAGNOSIS — H35371 Puckering of macula, right eye: Secondary | ICD-10-CM | POA: Diagnosis not present

## 2023-02-02 DIAGNOSIS — H3093 Unspecified chorioretinal inflammation, bilateral: Secondary | ICD-10-CM | POA: Diagnosis not present

## 2023-02-02 DIAGNOSIS — H04123 Dry eye syndrome of bilateral lacrimal glands: Secondary | ICD-10-CM | POA: Diagnosis not present

## 2023-02-15 DIAGNOSIS — D171 Benign lipomatous neoplasm of skin and subcutaneous tissue of trunk: Secondary | ICD-10-CM | POA: Diagnosis not present

## 2023-02-15 DIAGNOSIS — D0359 Melanoma in situ of other part of trunk: Secondary | ICD-10-CM | POA: Diagnosis not present

## 2023-02-15 DIAGNOSIS — D225 Melanocytic nevi of trunk: Secondary | ICD-10-CM | POA: Diagnosis not present

## 2023-02-15 DIAGNOSIS — D2271 Melanocytic nevi of right lower limb, including hip: Secondary | ICD-10-CM | POA: Diagnosis not present

## 2023-02-15 DIAGNOSIS — D224 Melanocytic nevi of scalp and neck: Secondary | ICD-10-CM | POA: Diagnosis not present

## 2023-02-15 DIAGNOSIS — D2262 Melanocytic nevi of left upper limb, including shoulder: Secondary | ICD-10-CM | POA: Diagnosis not present

## 2023-02-15 DIAGNOSIS — L821 Other seborrheic keratosis: Secondary | ICD-10-CM | POA: Diagnosis not present

## 2023-02-15 DIAGNOSIS — L814 Other melanin hyperpigmentation: Secondary | ICD-10-CM | POA: Diagnosis not present

## 2023-02-15 DIAGNOSIS — D2261 Melanocytic nevi of right upper limb, including shoulder: Secondary | ICD-10-CM | POA: Diagnosis not present

## 2023-03-15 DIAGNOSIS — N183 Chronic kidney disease, stage 3 unspecified: Secondary | ICD-10-CM | POA: Diagnosis not present

## 2023-03-15 DIAGNOSIS — R7309 Other abnormal glucose: Secondary | ICD-10-CM | POA: Diagnosis not present

## 2023-03-15 DIAGNOSIS — F418 Other specified anxiety disorders: Secondary | ICD-10-CM | POA: Diagnosis not present

## 2023-03-15 DIAGNOSIS — I10 Essential (primary) hypertension: Secondary | ICD-10-CM | POA: Diagnosis not present

## 2023-04-11 DIAGNOSIS — Z1231 Encounter for screening mammogram for malignant neoplasm of breast: Secondary | ICD-10-CM | POA: Diagnosis not present

## 2023-04-13 DIAGNOSIS — J329 Chronic sinusitis, unspecified: Secondary | ICD-10-CM | POA: Diagnosis not present

## 2023-04-13 DIAGNOSIS — Z0000 Encounter for general adult medical examination without abnormal findings: Secondary | ICD-10-CM | POA: Diagnosis not present

## 2023-04-13 DIAGNOSIS — H6593 Unspecified nonsuppurative otitis media, bilateral: Secondary | ICD-10-CM | POA: Diagnosis not present

## 2023-04-13 DIAGNOSIS — N289 Disorder of kidney and ureter, unspecified: Secondary | ICD-10-CM | POA: Diagnosis not present

## 2023-04-18 DIAGNOSIS — Z85828 Personal history of other malignant neoplasm of skin: Secondary | ICD-10-CM | POA: Diagnosis not present

## 2023-04-18 DIAGNOSIS — L988 Other specified disorders of the skin and subcutaneous tissue: Secondary | ICD-10-CM | POA: Diagnosis not present

## 2023-04-18 DIAGNOSIS — D0359 Melanoma in situ of other part of trunk: Secondary | ICD-10-CM | POA: Diagnosis not present

## 2023-04-26 DIAGNOSIS — H3093 Unspecified chorioretinal inflammation, bilateral: Secondary | ICD-10-CM | POA: Diagnosis not present

## 2023-04-26 DIAGNOSIS — H35371 Puckering of macula, right eye: Secondary | ICD-10-CM | POA: Diagnosis not present

## 2023-04-26 DIAGNOSIS — H401113 Primary open-angle glaucoma, right eye, severe stage: Secondary | ICD-10-CM | POA: Diagnosis not present

## 2023-05-20 DIAGNOSIS — R7309 Other abnormal glucose: Secondary | ICD-10-CM | POA: Diagnosis not present

## 2023-05-20 DIAGNOSIS — N1831 Chronic kidney disease, stage 3a: Secondary | ICD-10-CM | POA: Diagnosis not present

## 2023-05-20 DIAGNOSIS — F418 Other specified anxiety disorders: Secondary | ICD-10-CM | POA: Diagnosis not present

## 2023-05-20 DIAGNOSIS — E7800 Pure hypercholesterolemia, unspecified: Secondary | ICD-10-CM | POA: Diagnosis not present

## 2023-05-20 DIAGNOSIS — I10 Essential (primary) hypertension: Secondary | ICD-10-CM | POA: Diagnosis not present

## 2023-05-20 DIAGNOSIS — Z6826 Body mass index (BMI) 26.0-26.9, adult: Secondary | ICD-10-CM | POA: Diagnosis not present

## 2023-06-14 DIAGNOSIS — M79642 Pain in left hand: Secondary | ICD-10-CM | POA: Diagnosis not present

## 2023-07-07 DIAGNOSIS — N281 Cyst of kidney, acquired: Secondary | ICD-10-CM | POA: Diagnosis not present

## 2023-07-08 DIAGNOSIS — N281 Cyst of kidney, acquired: Secondary | ICD-10-CM | POA: Diagnosis not present

## 2023-08-01 DIAGNOSIS — I10 Essential (primary) hypertension: Secondary | ICD-10-CM | POA: Diagnosis not present

## 2023-08-01 DIAGNOSIS — Z0000 Encounter for general adult medical examination without abnormal findings: Secondary | ICD-10-CM | POA: Diagnosis not present

## 2023-08-01 DIAGNOSIS — R7309 Other abnormal glucose: Secondary | ICD-10-CM | POA: Diagnosis not present

## 2023-08-01 DIAGNOSIS — F418 Other specified anxiety disorders: Secondary | ICD-10-CM | POA: Diagnosis not present

## 2023-08-01 DIAGNOSIS — Z9181 History of falling: Secondary | ICD-10-CM | POA: Diagnosis not present

## 2023-08-01 DIAGNOSIS — M8580 Other specified disorders of bone density and structure, unspecified site: Secondary | ICD-10-CM | POA: Diagnosis not present

## 2023-08-01 DIAGNOSIS — E7800 Pure hypercholesterolemia, unspecified: Secondary | ICD-10-CM | POA: Diagnosis not present

## 2023-08-01 DIAGNOSIS — N183 Chronic kidney disease, stage 3 unspecified: Secondary | ICD-10-CM | POA: Diagnosis not present

## 2023-08-05 DIAGNOSIS — H3093 Unspecified chorioretinal inflammation, bilateral: Secondary | ICD-10-CM | POA: Diagnosis not present

## 2023-08-29 DIAGNOSIS — Z4789 Encounter for other orthopedic aftercare: Secondary | ICD-10-CM | POA: Diagnosis not present

## 2023-08-29 DIAGNOSIS — M4316 Spondylolisthesis, lumbar region: Secondary | ICD-10-CM | POA: Diagnosis not present

## 2023-08-29 DIAGNOSIS — M5136 Other intervertebral disc degeneration, lumbar region: Secondary | ICD-10-CM | POA: Diagnosis not present

## 2023-08-29 DIAGNOSIS — Z981 Arthrodesis status: Secondary | ICD-10-CM | POA: Diagnosis not present

## 2023-09-16 DIAGNOSIS — H401113 Primary open-angle glaucoma, right eye, severe stage: Secondary | ICD-10-CM | POA: Diagnosis not present

## 2023-09-16 DIAGNOSIS — H3093 Unspecified chorioretinal inflammation, bilateral: Secondary | ICD-10-CM | POA: Diagnosis not present

## 2023-10-25 DIAGNOSIS — E7800 Pure hypercholesterolemia, unspecified: Secondary | ICD-10-CM | POA: Diagnosis not present

## 2023-10-25 DIAGNOSIS — Z23 Encounter for immunization: Secondary | ICD-10-CM | POA: Diagnosis not present

## 2023-10-25 DIAGNOSIS — N1831 Chronic kidney disease, stage 3a: Secondary | ICD-10-CM | POA: Diagnosis not present

## 2023-10-25 DIAGNOSIS — R7309 Other abnormal glucose: Secondary | ICD-10-CM | POA: Diagnosis not present

## 2023-10-25 DIAGNOSIS — I10 Essential (primary) hypertension: Secondary | ICD-10-CM | POA: Diagnosis not present

## 2023-12-15 IMAGING — CT CT ABD-PELV W/O CM
2 of 4 series · 17 of 46 positions shown, 19 images · non-contrast
Comparison: 10/30/2016

CLINICAL DATA: Nausea and vomiting. Evaluate for small bowel
obstruction versus fecal impaction.



[Series 2: abd pel wo · axial · 0.82mm/px · z∈[+433,+838]mm · 14 of 89 slices shown, 16 images]
[im 4/89  soft-tissue]
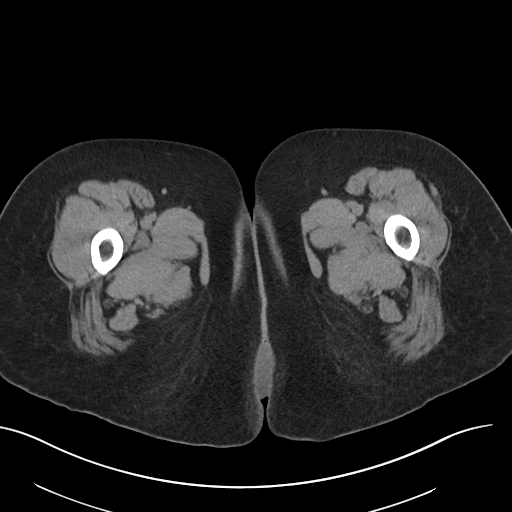
[im 4/89  bone]
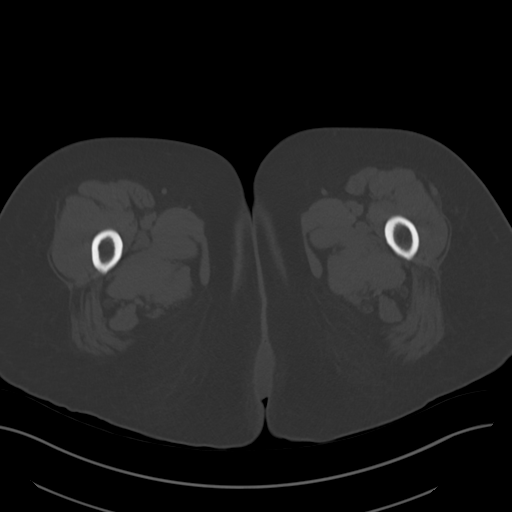
[im 11/89  soft-tissue]
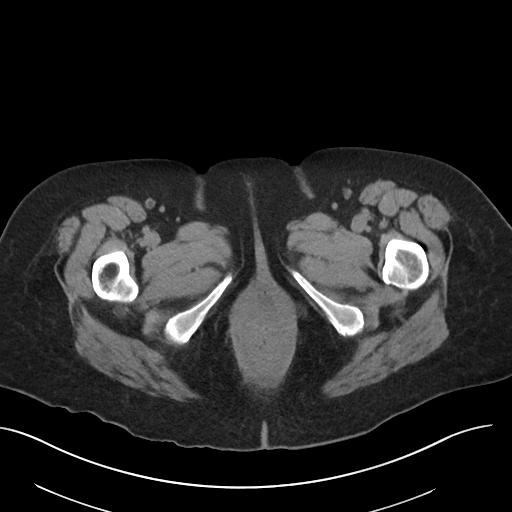
[im 18/89  soft-tissue]
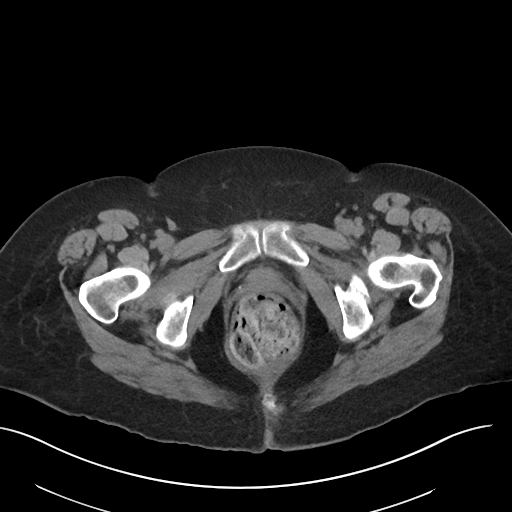
[im 25/89  soft-tissue]
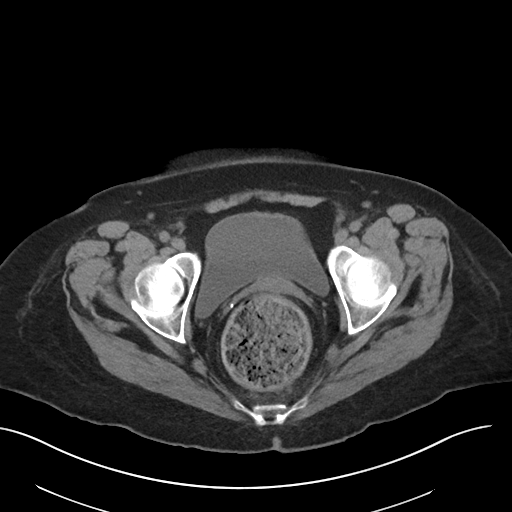
[im 29/89  soft-tissue]
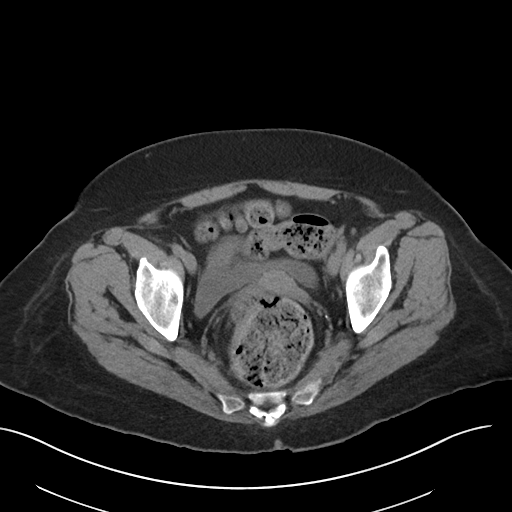
[im 36/89  soft-tissue]
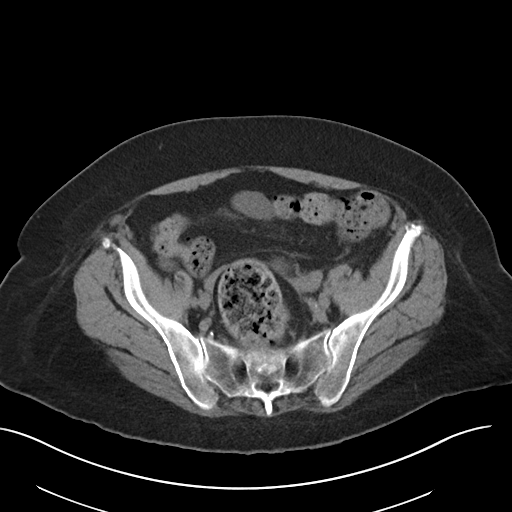
[im 43/89  soft-tissue]
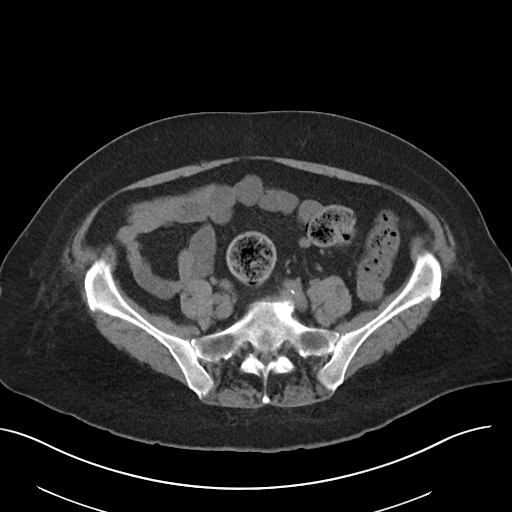
[im 46/89  soft-tissue]
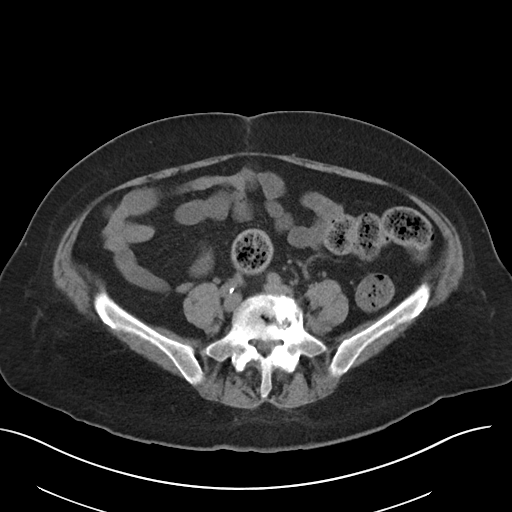
[im 53/89  soft-tissue]
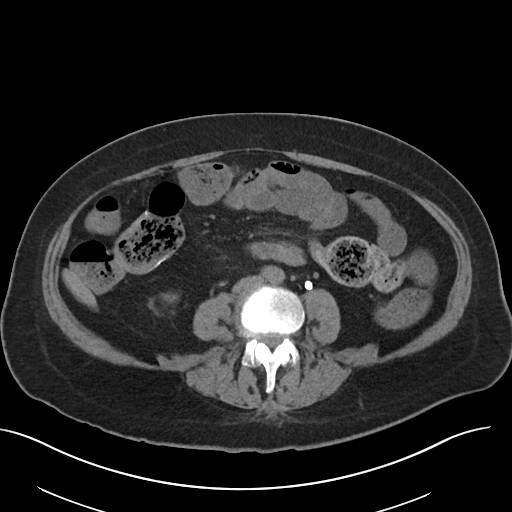
[im 53/89  bone]
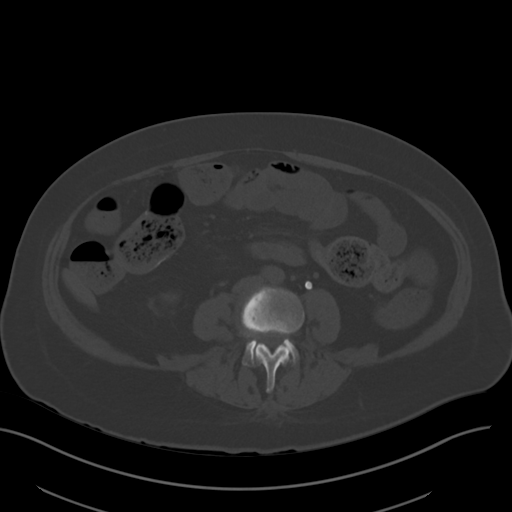
[im 60/89  soft-tissue]
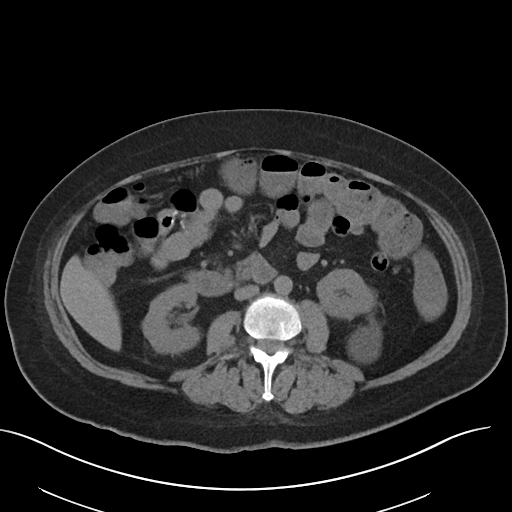
[im 67/89  soft-tissue]
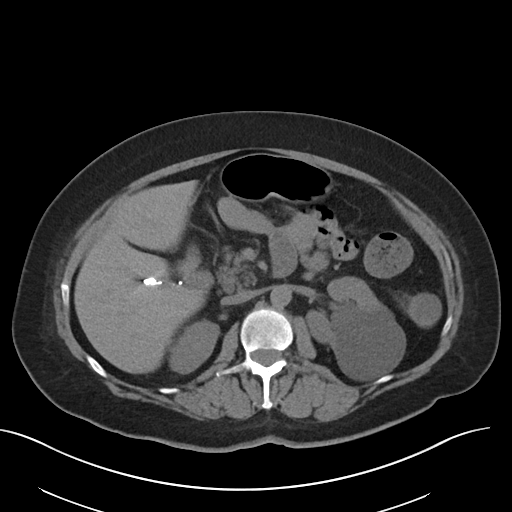
[im 71/89  soft-tissue]
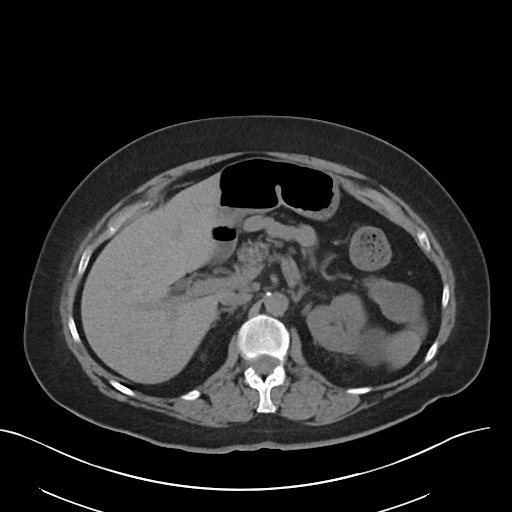
[im 78/89  soft-tissue]
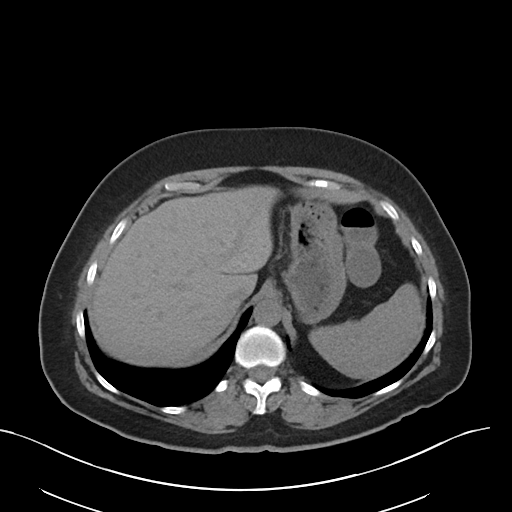
[im 85/89  soft-tissue]
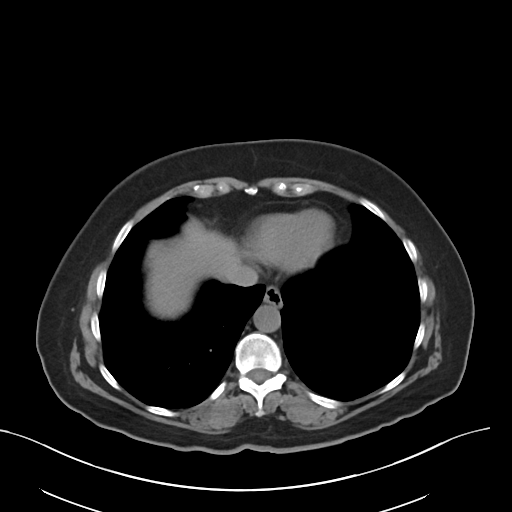

[Series 5: coronal · coronal · 0.89mm/px · 3 of 98 slices shown]
[im 33/98  soft-tissue]
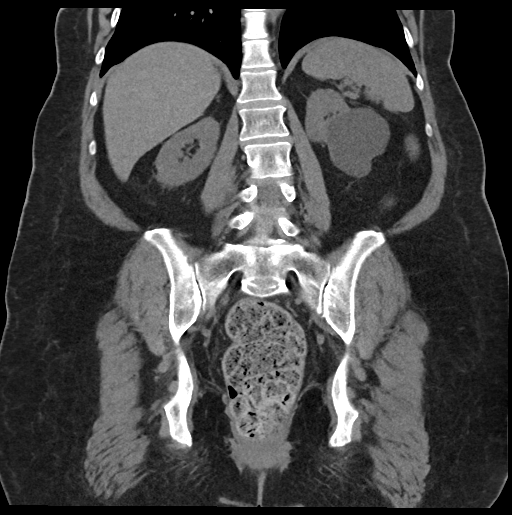
[im 44/98  soft-tissue]
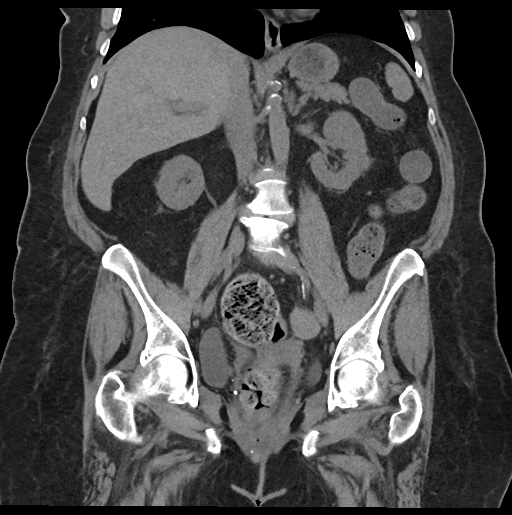
[im 54/98  soft-tissue]
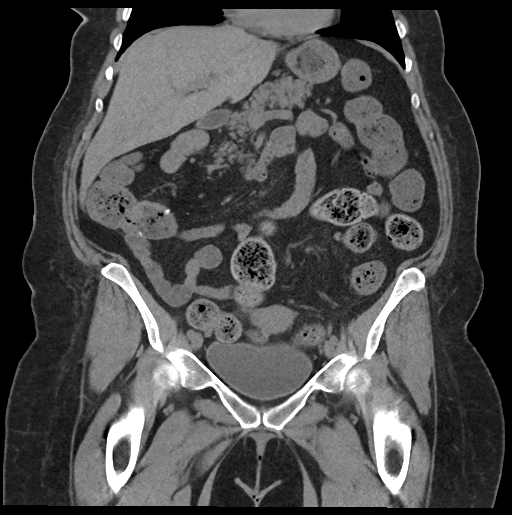

[17 of 46 positions shown; findings below may reference images not displayed]

FINDINGS: Lower chest: Lung bases are clear.

Hepatobiliary: Previous cholecystectomy. Liver and biliary tree are
normal.

Pancreas: Normal.

Spleen: Normal.

Adrenals/Urinary Tract: Adrenal glands are normal. Kidneys are
normal in size. 6 cm cyst over the mid pole left kidney unchanged.
Small cyst over the upper pole right kidney unchanged. Ureters and
bladder are normal.

Stomach/Bowel: Stomach and small bowel are unremarkable. Evidence of
previous partial right colectomy. Significant fecal retention over
the rectosigmoid colon.

Vascular/Lymphatic: Minimal calcified plaque over the abdominal
aorta which is normal in caliber. Remaining vascular structures are
unremarkable. No evidence of adenopathy.

Reproductive: Normal.

Other: No free fluid or focal inflammatory change.

Musculoskeletal: Degenerative changes with disc disease at the L4-5
and L5-S1 levels.
IMPRESSION: 1. No acute findings in the abdomen/pelvis.
2. Significant fecal retention over the rectosigmoid colon. Evidence
of previous partial right colectomy.
3. Bilateral renal cysts unchanged.
4. Aortic atherosclerosis.

Aortic Atherosclerosis ([id number]-SAA.A).

## 2024-01-10 DIAGNOSIS — H5213 Myopia, bilateral: Secondary | ICD-10-CM | POA: Diagnosis not present

## 2024-01-10 DIAGNOSIS — Z9849 Cataract extraction status, unspecified eye: Secondary | ICD-10-CM | POA: Diagnosis not present

## 2024-01-10 DIAGNOSIS — H53143 Visual discomfort, bilateral: Secondary | ICD-10-CM | POA: Diagnosis not present

## 2024-01-10 DIAGNOSIS — H524 Presbyopia: Secondary | ICD-10-CM | POA: Diagnosis not present

## 2024-01-10 DIAGNOSIS — Z961 Presence of intraocular lens: Secondary | ICD-10-CM | POA: Diagnosis not present

## 2024-01-10 DIAGNOSIS — H52223 Regular astigmatism, bilateral: Secondary | ICD-10-CM | POA: Diagnosis not present

## 2024-01-10 DIAGNOSIS — H04123 Dry eye syndrome of bilateral lacrimal glands: Secondary | ICD-10-CM | POA: Diagnosis not present

## 2024-01-13 DIAGNOSIS — H4389 Other disorders of vitreous body: Secondary | ICD-10-CM | POA: Diagnosis not present

## 2024-01-13 DIAGNOSIS — Z01818 Encounter for other preprocedural examination: Secondary | ICD-10-CM | POA: Diagnosis not present

## 2024-01-13 DIAGNOSIS — H3093 Unspecified chorioretinal inflammation, bilateral: Secondary | ICD-10-CM | POA: Diagnosis not present

## 2024-02-06 DIAGNOSIS — L821 Other seborrheic keratosis: Secondary | ICD-10-CM | POA: Diagnosis not present

## 2024-02-06 DIAGNOSIS — L570 Actinic keratosis: Secondary | ICD-10-CM | POA: Diagnosis not present

## 2024-02-06 DIAGNOSIS — D485 Neoplasm of uncertain behavior of skin: Secondary | ICD-10-CM | POA: Diagnosis not present

## 2024-02-06 DIAGNOSIS — D2271 Melanocytic nevi of right lower limb, including hip: Secondary | ICD-10-CM | POA: Diagnosis not present

## 2024-02-06 DIAGNOSIS — Z85820 Personal history of malignant melanoma of skin: Secondary | ICD-10-CM | POA: Diagnosis not present

## 2024-02-06 DIAGNOSIS — I788 Other diseases of capillaries: Secondary | ICD-10-CM | POA: Diagnosis not present

## 2024-02-06 DIAGNOSIS — D225 Melanocytic nevi of trunk: Secondary | ICD-10-CM | POA: Diagnosis not present

## 2024-02-06 DIAGNOSIS — D2262 Melanocytic nevi of left upper limb, including shoulder: Secondary | ICD-10-CM | POA: Diagnosis not present

## 2024-02-06 DIAGNOSIS — D692 Other nonthrombocytopenic purpura: Secondary | ICD-10-CM | POA: Diagnosis not present

## 2024-02-06 DIAGNOSIS — D171 Benign lipomatous neoplasm of skin and subcutaneous tissue of trunk: Secondary | ICD-10-CM | POA: Diagnosis not present

## 2024-02-07 DIAGNOSIS — I10 Essential (primary) hypertension: Secondary | ICD-10-CM | POA: Diagnosis not present

## 2024-02-07 DIAGNOSIS — H4389 Other disorders of vitreous body: Secondary | ICD-10-CM | POA: Diagnosis not present

## 2024-02-15 DIAGNOSIS — H35371 Puckering of macula, right eye: Secondary | ICD-10-CM | POA: Diagnosis not present

## 2024-02-22 DIAGNOSIS — E7800 Pure hypercholesterolemia, unspecified: Secondary | ICD-10-CM | POA: Diagnosis not present

## 2024-02-22 DIAGNOSIS — R7309 Other abnormal glucose: Secondary | ICD-10-CM | POA: Diagnosis not present

## 2024-02-22 DIAGNOSIS — I10 Essential (primary) hypertension: Secondary | ICD-10-CM | POA: Diagnosis not present

## 2024-02-22 DIAGNOSIS — N1831 Chronic kidney disease, stage 3a: Secondary | ICD-10-CM | POA: Diagnosis not present

## 2024-03-13 DIAGNOSIS — H401113 Primary open-angle glaucoma, right eye, severe stage: Secondary | ICD-10-CM | POA: Diagnosis not present

## 2024-04-02 DIAGNOSIS — L821 Other seborrheic keratosis: Secondary | ICD-10-CM | POA: Diagnosis not present

## 2024-04-02 DIAGNOSIS — B078 Other viral warts: Secondary | ICD-10-CM | POA: Diagnosis not present

## 2024-04-02 DIAGNOSIS — Z419 Encounter for procedure for purposes other than remedying health state, unspecified: Secondary | ICD-10-CM | POA: Diagnosis not present

## 2024-04-02 DIAGNOSIS — Z85828 Personal history of other malignant neoplasm of skin: Secondary | ICD-10-CM | POA: Diagnosis not present

## 2024-04-16 DIAGNOSIS — Z1231 Encounter for screening mammogram for malignant neoplasm of breast: Secondary | ICD-10-CM | POA: Diagnosis not present

## 2024-04-20 DIAGNOSIS — H35371 Puckering of macula, right eye: Secondary | ICD-10-CM | POA: Diagnosis not present

## 2024-04-20 DIAGNOSIS — H43392 Other vitreous opacities, left eye: Secondary | ICD-10-CM | POA: Diagnosis not present

## 2024-04-20 DIAGNOSIS — Z01818 Encounter for other preprocedural examination: Secondary | ICD-10-CM | POA: Diagnosis not present

## 2024-05-15 DIAGNOSIS — I10 Essential (primary) hypertension: Secondary | ICD-10-CM | POA: Diagnosis not present

## 2024-05-15 DIAGNOSIS — H43392 Other vitreous opacities, left eye: Secondary | ICD-10-CM | POA: Diagnosis not present

## 2024-05-17 DIAGNOSIS — N1831 Chronic kidney disease, stage 3a: Secondary | ICD-10-CM | POA: Diagnosis not present

## 2024-05-17 DIAGNOSIS — I10 Essential (primary) hypertension: Secondary | ICD-10-CM | POA: Diagnosis not present

## 2024-05-17 DIAGNOSIS — R7309 Other abnormal glucose: Secondary | ICD-10-CM | POA: Diagnosis not present

## 2024-05-17 DIAGNOSIS — E7800 Pure hypercholesterolemia, unspecified: Secondary | ICD-10-CM | POA: Diagnosis not present

## 2024-05-24 DIAGNOSIS — N183 Chronic kidney disease, stage 3 unspecified: Secondary | ICD-10-CM | POA: Diagnosis not present

## 2024-05-24 DIAGNOSIS — N1831 Chronic kidney disease, stage 3a: Secondary | ICD-10-CM | POA: Diagnosis not present

## 2024-05-24 DIAGNOSIS — I10 Essential (primary) hypertension: Secondary | ICD-10-CM | POA: Diagnosis not present

## 2024-05-25 DIAGNOSIS — I10 Essential (primary) hypertension: Secondary | ICD-10-CM | POA: Diagnosis not present

## 2024-05-25 DIAGNOSIS — N1831 Chronic kidney disease, stage 3a: Secondary | ICD-10-CM | POA: Diagnosis not present

## 2024-07-05 DIAGNOSIS — E7800 Pure hypercholesterolemia, unspecified: Secondary | ICD-10-CM | POA: Diagnosis not present

## 2024-07-05 DIAGNOSIS — N183 Chronic kidney disease, stage 3 unspecified: Secondary | ICD-10-CM | POA: Diagnosis not present

## 2024-07-05 DIAGNOSIS — N1831 Chronic kidney disease, stage 3a: Secondary | ICD-10-CM | POA: Diagnosis not present

## 2024-07-05 DIAGNOSIS — F418 Other specified anxiety disorders: Secondary | ICD-10-CM | POA: Diagnosis not present

## 2024-07-05 DIAGNOSIS — I10 Essential (primary) hypertension: Secondary | ICD-10-CM | POA: Diagnosis not present

## 2024-07-06 DIAGNOSIS — H35371 Puckering of macula, right eye: Secondary | ICD-10-CM | POA: Diagnosis not present

## 2024-07-22 DIAGNOSIS — N1831 Chronic kidney disease, stage 3a: Secondary | ICD-10-CM | POA: Diagnosis not present

## 2024-07-22 DIAGNOSIS — N183 Chronic kidney disease, stage 3 unspecified: Secondary | ICD-10-CM | POA: Diagnosis not present

## 2024-07-22 DIAGNOSIS — I10 Essential (primary) hypertension: Secondary | ICD-10-CM | POA: Diagnosis not present

## 2024-07-24 DIAGNOSIS — H401113 Primary open-angle glaucoma, right eye, severe stage: Secondary | ICD-10-CM | POA: Diagnosis not present

## 2024-08-05 DIAGNOSIS — I10 Essential (primary) hypertension: Secondary | ICD-10-CM | POA: Diagnosis not present

## 2024-08-05 DIAGNOSIS — N183 Chronic kidney disease, stage 3 unspecified: Secondary | ICD-10-CM | POA: Diagnosis not present

## 2024-08-05 DIAGNOSIS — E7800 Pure hypercholesterolemia, unspecified: Secondary | ICD-10-CM | POA: Diagnosis not present

## 2024-08-05 DIAGNOSIS — N1831 Chronic kidney disease, stage 3a: Secondary | ICD-10-CM | POA: Diagnosis not present

## 2024-08-05 DIAGNOSIS — F418 Other specified anxiety disorders: Secondary | ICD-10-CM | POA: Diagnosis not present

## 2024-08-16 DIAGNOSIS — R7309 Other abnormal glucose: Secondary | ICD-10-CM | POA: Diagnosis not present

## 2024-08-16 DIAGNOSIS — N183 Chronic kidney disease, stage 3 unspecified: Secondary | ICD-10-CM | POA: Diagnosis not present

## 2024-08-16 DIAGNOSIS — Z23 Encounter for immunization: Secondary | ICD-10-CM | POA: Diagnosis not present

## 2024-08-16 DIAGNOSIS — F418 Other specified anxiety disorders: Secondary | ICD-10-CM | POA: Diagnosis not present

## 2024-08-16 DIAGNOSIS — I10 Essential (primary) hypertension: Secondary | ICD-10-CM | POA: Diagnosis not present

## 2024-08-16 DIAGNOSIS — R110 Nausea: Secondary | ICD-10-CM | POA: Diagnosis not present

## 2024-08-16 DIAGNOSIS — M8580 Other specified disorders of bone density and structure, unspecified site: Secondary | ICD-10-CM | POA: Diagnosis not present

## 2024-08-16 DIAGNOSIS — E7800 Pure hypercholesterolemia, unspecified: Secondary | ICD-10-CM | POA: Diagnosis not present

## 2024-08-16 DIAGNOSIS — Z0000 Encounter for general adult medical examination without abnormal findings: Secondary | ICD-10-CM | POA: Diagnosis not present

## 2024-08-21 DIAGNOSIS — N183 Chronic kidney disease, stage 3 unspecified: Secondary | ICD-10-CM | POA: Diagnosis not present

## 2024-08-21 DIAGNOSIS — N1831 Chronic kidney disease, stage 3a: Secondary | ICD-10-CM | POA: Diagnosis not present

## 2024-08-21 DIAGNOSIS — I10 Essential (primary) hypertension: Secondary | ICD-10-CM | POA: Diagnosis not present

## 2024-08-22 ENCOUNTER — Other Ambulatory Visit (HOSPITAL_BASED_OUTPATIENT_CLINIC_OR_DEPARTMENT_OTHER): Payer: Self-pay | Admitting: Family Medicine

## 2024-08-22 DIAGNOSIS — M8580 Other specified disorders of bone density and structure, unspecified site: Secondary | ICD-10-CM

## 2024-09-04 DIAGNOSIS — F418 Other specified anxiety disorders: Secondary | ICD-10-CM | POA: Diagnosis not present

## 2024-09-04 DIAGNOSIS — E7800 Pure hypercholesterolemia, unspecified: Secondary | ICD-10-CM | POA: Diagnosis not present

## 2024-09-04 DIAGNOSIS — N1831 Chronic kidney disease, stage 3a: Secondary | ICD-10-CM | POA: Diagnosis not present

## 2024-09-04 DIAGNOSIS — I10 Essential (primary) hypertension: Secondary | ICD-10-CM | POA: Diagnosis not present

## 2024-09-04 DIAGNOSIS — N183 Chronic kidney disease, stage 3 unspecified: Secondary | ICD-10-CM | POA: Diagnosis not present

## 2024-09-20 DIAGNOSIS — N183 Chronic kidney disease, stage 3 unspecified: Secondary | ICD-10-CM | POA: Diagnosis not present

## 2024-09-20 DIAGNOSIS — I10 Essential (primary) hypertension: Secondary | ICD-10-CM | POA: Diagnosis not present

## 2024-09-20 DIAGNOSIS — N1831 Chronic kidney disease, stage 3a: Secondary | ICD-10-CM | POA: Diagnosis not present

## 2024-09-24 DIAGNOSIS — R399 Unspecified symptoms and signs involving the genitourinary system: Secondary | ICD-10-CM | POA: Diagnosis not present

## 2024-09-24 DIAGNOSIS — N281 Cyst of kidney, acquired: Secondary | ICD-10-CM | POA: Diagnosis not present

## 2024-10-05 DIAGNOSIS — N1831 Chronic kidney disease, stage 3a: Secondary | ICD-10-CM | POA: Diagnosis not present

## 2024-10-05 DIAGNOSIS — N183 Chronic kidney disease, stage 3 unspecified: Secondary | ICD-10-CM | POA: Diagnosis not present

## 2024-10-05 DIAGNOSIS — E7800 Pure hypercholesterolemia, unspecified: Secondary | ICD-10-CM | POA: Diagnosis not present

## 2024-10-05 DIAGNOSIS — F418 Other specified anxiety disorders: Secondary | ICD-10-CM | POA: Diagnosis not present

## 2024-10-05 DIAGNOSIS — I10 Essential (primary) hypertension: Secondary | ICD-10-CM | POA: Diagnosis not present

## 2024-10-23 DIAGNOSIS — D2261 Melanocytic nevi of right upper limb, including shoulder: Secondary | ICD-10-CM | POA: Diagnosis not present

## 2024-10-23 DIAGNOSIS — L821 Other seborrheic keratosis: Secondary | ICD-10-CM | POA: Diagnosis not present

## 2024-10-23 DIAGNOSIS — D225 Melanocytic nevi of trunk: Secondary | ICD-10-CM | POA: Diagnosis not present

## 2024-10-23 DIAGNOSIS — D692 Other nonthrombocytopenic purpura: Secondary | ICD-10-CM | POA: Diagnosis not present

## 2024-10-23 DIAGNOSIS — Z85828 Personal history of other malignant neoplasm of skin: Secondary | ICD-10-CM | POA: Diagnosis not present

## 2024-10-23 DIAGNOSIS — D2272 Melanocytic nevi of left lower limb, including hip: Secondary | ICD-10-CM | POA: Diagnosis not present

## 2024-10-23 DIAGNOSIS — D2271 Melanocytic nevi of right lower limb, including hip: Secondary | ICD-10-CM | POA: Diagnosis not present

## 2024-10-23 DIAGNOSIS — D2262 Melanocytic nevi of left upper limb, including shoulder: Secondary | ICD-10-CM | POA: Diagnosis not present

## 2024-10-23 DIAGNOSIS — L720 Epidermal cyst: Secondary | ICD-10-CM | POA: Diagnosis not present

## 2024-11-04 DIAGNOSIS — N183 Chronic kidney disease, stage 3 unspecified: Secondary | ICD-10-CM | POA: Diagnosis not present

## 2024-11-04 DIAGNOSIS — F418 Other specified anxiety disorders: Secondary | ICD-10-CM | POA: Diagnosis not present

## 2024-11-04 DIAGNOSIS — N1831 Chronic kidney disease, stage 3a: Secondary | ICD-10-CM | POA: Diagnosis not present

## 2024-11-04 DIAGNOSIS — E7800 Pure hypercholesterolemia, unspecified: Secondary | ICD-10-CM | POA: Diagnosis not present

## 2024-11-15 DIAGNOSIS — R7309 Other abnormal glucose: Secondary | ICD-10-CM | POA: Diagnosis not present

## 2024-11-15 DIAGNOSIS — E7800 Pure hypercholesterolemia, unspecified: Secondary | ICD-10-CM | POA: Diagnosis not present
